# Patient Record
Sex: Male | Born: 1960
Health system: Southern US, Community
[De-identification: ages and names within clinical notes are randomized; demographics above are authoritative.]

## PROBLEM LIST (undated history)

## (undated) DIAGNOSIS — I10 Essential (primary) hypertension: Secondary | ICD-10-CM

## (undated) DIAGNOSIS — I2510 Atherosclerotic heart disease of native coronary artery without angina pectoris: Secondary | ICD-10-CM

## (undated) DIAGNOSIS — M549 Dorsalgia, unspecified: Secondary | ICD-10-CM

## (undated) DIAGNOSIS — C801 Malignant (primary) neoplasm, unspecified: Secondary | ICD-10-CM

## (undated) DIAGNOSIS — IMO0002 Reserved for concepts with insufficient information to code with codable children: Secondary | ICD-10-CM

## (undated) DIAGNOSIS — I77810 Thoracic aortic ectasia: Secondary | ICD-10-CM

## (undated) DIAGNOSIS — H35039 Hypertensive retinopathy, unspecified eye: Secondary | ICD-10-CM

## (undated) DIAGNOSIS — E785 Hyperlipidemia, unspecified: Secondary | ICD-10-CM

## (undated) DIAGNOSIS — I5189 Other ill-defined heart diseases: Secondary | ICD-10-CM

## (undated) DIAGNOSIS — G5600 Carpal tunnel syndrome, unspecified upper limb: Secondary | ICD-10-CM

## (undated) HISTORY — PX: TONSILLECTOMY: SUR1361

## (undated) HISTORY — DX: Hypertensive retinopathy, unspecified eye: H35.039

## (undated) HISTORY — DX: Reserved for concepts with insufficient information to code with codable children: IMO0002

## (undated) HISTORY — PX: COLONOSCOPY: SHX174

## (undated) HISTORY — PX: SHOULDER ARTHROSCOPY: SHX128

## (undated) HISTORY — PX: KNEE ARTHROSCOPY: SHX127

## (undated) HISTORY — DX: Malignant (primary) neoplasm, unspecified: C80.1

---

## 2001-10-14 HISTORY — PX: RIB RESECTION: SHX5077

## 2008-10-14 DIAGNOSIS — C801 Malignant (primary) neoplasm, unspecified: Secondary | ICD-10-CM

## 2008-10-14 HISTORY — DX: Malignant (primary) neoplasm, unspecified: C80.1

## 2012-12-24 ENCOUNTER — Other Ambulatory Visit: Payer: Self-pay | Admitting: Orthopedic Surgery

## 2013-01-01 ENCOUNTER — Encounter (HOSPITAL_BASED_OUTPATIENT_CLINIC_OR_DEPARTMENT_OTHER): Payer: Self-pay | Admitting: *Deleted

## 2013-01-06 ENCOUNTER — Encounter (HOSPITAL_BASED_OUTPATIENT_CLINIC_OR_DEPARTMENT_OTHER): Payer: Self-pay | Admitting: Anesthesiology

## 2013-01-06 ENCOUNTER — Encounter (HOSPITAL_BASED_OUTPATIENT_CLINIC_OR_DEPARTMENT_OTHER): Payer: Self-pay | Admitting: *Deleted

## 2013-01-06 ENCOUNTER — Encounter (HOSPITAL_BASED_OUTPATIENT_CLINIC_OR_DEPARTMENT_OTHER)
Admission: RE | Disposition: A | Discharge status: Home / Self Care | Payer: Self-pay | Source: Ambulatory Visit | Attending: Orthopedic Surgery

## 2013-01-06 ENCOUNTER — Ambulatory Visit (HOSPITAL_BASED_OUTPATIENT_CLINIC_OR_DEPARTMENT_OTHER)
Admission: RE | Admit: 2013-01-06 | Discharge: 2013-01-06 | Disposition: A | Discharge status: Home / Self Care | Payer: Managed Care, Other (non HMO) | Source: Ambulatory Visit | Attending: Orthopedic Surgery | Admitting: Orthopedic Surgery

## 2013-01-06 ENCOUNTER — Ambulatory Visit (HOSPITAL_BASED_OUTPATIENT_CLINIC_OR_DEPARTMENT_OTHER): Payer: Managed Care, Other (non HMO) | Admitting: Anesthesiology

## 2013-01-06 DIAGNOSIS — M549 Dorsalgia, unspecified: Secondary | ICD-10-CM | POA: Insufficient documentation

## 2013-01-06 DIAGNOSIS — Z79899 Other long term (current) drug therapy: Secondary | ICD-10-CM | POA: Insufficient documentation

## 2013-01-06 DIAGNOSIS — Z791 Long term (current) use of non-steroidal anti-inflammatories (NSAID): Secondary | ICD-10-CM | POA: Insufficient documentation

## 2013-01-06 DIAGNOSIS — G5600 Carpal tunnel syndrome, unspecified upper limb: Secondary | ICD-10-CM | POA: Insufficient documentation

## 2013-01-06 DIAGNOSIS — E785 Hyperlipidemia, unspecified: Secondary | ICD-10-CM | POA: Insufficient documentation

## 2013-01-06 HISTORY — DX: Dorsalgia, unspecified: M54.9

## 2013-01-06 HISTORY — DX: Carpal tunnel syndrome, unspecified upper limb: G56.00

## 2013-01-06 HISTORY — PX: CARPAL TUNNEL RELEASE: SHX101

## 2013-01-06 HISTORY — DX: Hyperlipidemia, unspecified: E78.5

## 2013-01-06 SURGERY — CARPAL TUNNEL RELEASE
Anesthesia: Monitor Anesthesia Care | Site: Hand | Laterality: Left | Wound class: Clean

## 2013-01-06 MED ORDER — OXYCODONE HCL 5 MG/5ML PO SOLN
5.0000 mg | Freq: Once | ORAL | Status: DC | PRN
Start: 2013-01-06 — End: 2013-01-06

## 2013-01-06 MED ORDER — OXYCODONE HCL 5 MG PO TABS: 5.0000 mg | ORAL_TABLET | Freq: Once | ORAL | Status: DC | PRN

## 2013-01-06 MED ORDER — ONDANSETRON HCL 4 MG/2ML IJ SOLN
INTRAMUSCULAR | Status: DC | PRN
  Administered 2013-01-06: 4 mg via INTRAVENOUS

## 2013-01-06 MED ORDER — CHLORHEXIDINE GLUCONATE 4 % EX LIQD: 60.0000 mL | Freq: Once | CUTANEOUS | Status: DC

## 2013-01-06 MED ORDER — BUPIVACAINE HCL (PF) 0.25 % IJ SOLN
INTRAMUSCULAR | Status: DC | PRN
  Administered 2013-01-06: 9 mL

## 2013-01-06 MED ORDER — CEFAZOLIN SODIUM-DEXTROSE 2-3 GM-% IV SOLR: 2.0000 g | INTRAVENOUS | Status: DC

## 2013-01-06 MED ORDER — PROPOFOL INFUSION 10 MG/ML OPTIME
INTRAVENOUS | Status: DC | PRN
  Administered 2013-01-06: 100 ug/kg/min via INTRAVENOUS

## 2013-01-06 MED ORDER — HYDROCODONE-ACETAMINOPHEN 5-325 MG PO TABS: 1.0000 | ORAL_TABLET | Freq: Four times a day (QID) | ORAL | Status: DC | PRN

## 2013-01-06 MED ORDER — FENTANYL CITRATE 0.05 MG/ML IJ SOLN: 50.0000 ug | INTRAMUSCULAR | Status: DC | PRN

## 2013-01-06 MED ORDER — METOCLOPRAMIDE HCL 5 MG/ML IJ SOLN: 10.0000 mg | Freq: Once | INTRAMUSCULAR | Status: DC | PRN

## 2013-01-06 MED ORDER — MIDAZOLAM HCL 5 MG/5ML IJ SOLN
INTRAMUSCULAR | Status: DC | PRN
  Administered 2013-01-06: 2 mg via INTRAVENOUS

## 2013-01-06 MED ORDER — FENTANYL CITRATE 0.05 MG/ML IJ SOLN
INTRAMUSCULAR | Status: DC | PRN
  Administered 2013-01-06 (×2): 50 ug via INTRAVENOUS

## 2013-01-06 MED ORDER — MIDAZOLAM HCL 2 MG/2ML IJ SOLN: 1.0000 mg | INTRAMUSCULAR | Status: DC | PRN

## 2013-01-06 MED ORDER — CEFAZOLIN SODIUM-DEXTROSE 2-3 GM-% IV SOLR
2.0000 g | INTRAVENOUS | Status: AC
  Administered 2013-01-06: 2 g via INTRAVENOUS

## 2013-01-06 MED ORDER — FENTANYL CITRATE 0.05 MG/ML IJ SOLN: 25.0000 ug | INTRAMUSCULAR | Status: DC | PRN

## 2013-01-06 MED ORDER — LACTATED RINGERS IV SOLN
INTRAVENOUS | Status: DC
  Administered 2013-01-06 (×2): via INTRAVENOUS

## 2013-01-06 MED ORDER — LIDOCAINE HCL (CARDIAC) 20 MG/ML IV SOLN
INTRAVENOUS | Status: DC | PRN
  Administered 2013-01-06: 50 mg via INTRAVENOUS

## 2013-01-06 SURGICAL SUPPLY — 36 items
BANDAGE GAUZE ELAST BULKY 4 IN (GAUZE/BANDAGES/DRESSINGS) ×2 IMPLANT
BLADE SURG 15 STRL LF DISP TIS (BLADE) ×1 IMPLANT
BLADE SURG 15 STRL SS (BLADE) ×1
BNDG COHESIVE 3X5 TAN STRL LF (GAUZE/BANDAGES/DRESSINGS) ×2 IMPLANT
BNDG ESMARK 4X9 LF (GAUZE/BANDAGES/DRESSINGS) IMPLANT
CHLORAPREP W/TINT 26ML (MISCELLANEOUS) ×2 IMPLANT
CLOTH BEACON ORANGE TIMEOUT ST (SAFETY) ×2 IMPLANT
CORDS BIPOLAR (ELECTRODE) ×2 IMPLANT
COVER MAYO STAND STRL (DRAPES) ×2 IMPLANT
COVER TABLE BACK 60X90 (DRAPES) ×2 IMPLANT
CUFF TOURNIQUET SINGLE 18IN (TOURNIQUET CUFF) ×2 IMPLANT
DRAPE EXTREMITY T 121X128X90 (DRAPE) ×2 IMPLANT
DRAPE SURG 17X23 STRL (DRAPES) ×2 IMPLANT
DRSG KUZMA FLUFF (GAUZE/BANDAGES/DRESSINGS) ×2 IMPLANT
GAUZE XEROFORM 1X8 LF (GAUZE/BANDAGES/DRESSINGS) ×2 IMPLANT
GLOVE BIO SURGEON STRL SZ 6.5 (GLOVE) ×2 IMPLANT
GLOVE BIOGEL PI IND STRL 8.5 (GLOVE) ×1 IMPLANT
GLOVE BIOGEL PI INDICATOR 8.5 (GLOVE) ×1
GLOVE SURG ORTHO 8.0 STRL STRW (GLOVE) ×2 IMPLANT
GOWN BRE IMP PREV XXLGXLNG (GOWN DISPOSABLE) ×2 IMPLANT
GOWN PREVENTION PLUS XLARGE (GOWN DISPOSABLE) ×2 IMPLANT
NEEDLE 27GAX1X1/2 (NEEDLE) ×2 IMPLANT
NS IRRIG 1000ML POUR BTL (IV SOLUTION) ×2 IMPLANT
PACK BASIN DAY SURGERY FS (CUSTOM PROCEDURE TRAY) ×2 IMPLANT
PAD CAST 3X4 CTTN HI CHSV (CAST SUPPLIES) ×1 IMPLANT
PADDING CAST ABS 4INX4YD NS (CAST SUPPLIES) ×1
PADDING CAST ABS COTTON 4X4 ST (CAST SUPPLIES) ×1 IMPLANT
PADDING CAST COTTON 3X4 STRL (CAST SUPPLIES) ×1
SPONGE GAUZE 4X4 12PLY (GAUZE/BANDAGES/DRESSINGS) ×2 IMPLANT
STOCKINETTE 4X48 STRL (DRAPES) ×2 IMPLANT
SUT VICRYL 4-0 PS2 18IN ABS (SUTURE) IMPLANT
SUT VICRYL RAPIDE 4/0 PS 2 (SUTURE) ×2 IMPLANT
SYR BULB 3OZ (MISCELLANEOUS) ×2 IMPLANT
SYR CONTROL 10ML LL (SYRINGE) ×2 IMPLANT
TOWEL OR 17X24 6PK STRL BLUE (TOWEL DISPOSABLE) ×2 IMPLANT
UNDERPAD 30X30 INCONTINENT (UNDERPADS AND DIAPERS) ×2 IMPLANT

## 2013-01-06 NOTE — Anesthesia Procedure Notes (Signed)
Anesthesia Regional Block:  Bier block (IV Regional)  Pre-Anesthetic Checklist: ,, timeout performed, Correct Patient, Correct Site, Correct Laterality, Correct Procedure,, site marked, surgical consent,, at surgeon's request Needles:  Injection technique: Single-shot  Needle Type: Other      Needle Gauge: 20 and 20 G    Additional Needles: Bier block (IV Regional) Narrative:  Start time: 01/06/2013 8:38 AM Injection made incrementally with aspirations every 35 mL.  Performed by: Personally   Bier block (IV Regional)

## 2013-01-06 NOTE — Brief Op Note (Signed)
01/06/2013  9:01 AM  PATIENT:  Penne Lash  52 y.o. male  PRE-OPERATIVE DIAGNOSIS:  LEFT CARPAL TUNNEL SYNDROME, 354.0  POST-OPERATIVE DIAGNOSIS:  LEFT CARPAL TUNNEL SYNDROME, 354.0  PROCEDURE:  Procedure(s) with comments: CARPAL TUNNEL RELEASE (Left) - ANESTHESIA: IV REGIONAL FAB  SURGEON:  Surgeon(s) and Role:    * Nicki Reaper, MD - Primary  PHYSICIAN ASSISTANT:   ASSISTANTS: none   ANESTHESIA:   local and regional  EBL:  Total I/O In: 1000 [I.V.:1000] Out: -   BLOOD ADMINISTERED:none  DRAINS: none   LOCAL MEDICATIONS USED:  MARCAINE     SPECIMEN:  No Specimen  DISPOSITION OF SPECIMEN:  N/A  COUNTS:  YES  TOURNIQUET:   Total Tourniquet Time Documented: Forearm (Left) - 21 minutes Total: Forearm (Left) - 21 minutes   DICTATION: .Other Dictation: Dictation Number [id number]  PLAN OF CARE: Discharge to home after PACU  PATIENT DISPOSITION:  PACU - hemodynamically stable.

## 2013-01-06 NOTE — Op Note (Signed)
Dictation Number [id number]

## 2013-01-06 NOTE — Transfer of Care (Signed)
Immediate Anesthesia Transfer of Care Note  Patient: Andrew Mitchell  Procedure(s) Performed: Procedure(s) with comments: CARPAL TUNNEL RELEASE (Left) - ANESTHESIA: IV REGIONAL FAB  Patient Location: PACU  Anesthesia Type:Bier block  Level of Consciousness: awake and alert   Airway & Oxygen Therapy: Patient Spontanous Breathing and Patient connected to face mask oxygen  Post-op Assessment: Report given to PACU RN and Post -op Vital signs reviewed and stable  Post vital signs: Reviewed and stable  Complications: No apparent anesthesia complications

## 2013-01-06 NOTE — Op Note (Signed)
NAMEMYREON, WIMER.:  [id number]  MEDICAL RECORD NO.:  [id number]  LOCATION:                                 FACILITY:  PHYSICIAN:  Cindee Salt, M.D.            DATE OF BIRTH:  DATE OF PROCEDURE:  01/06/2013 DATE OF DISCHARGE:                              OPERATIVE REPORT   PREOPERATIVE DIAGNOSIS:  Carpal tunnel syndrome, left hand.  POSTOPERATIVE DIAGNOSIS:  Carpal tunnel syndrome, left hand.  OPERATION:  Decompression left median nerve.  SURGEON:  Cindee Salt, MD  ANESTHESIA:  Forearm-based IV regional local infiltration.  ANESTHESIOLOGIST:  Janetta Hora. Gelene Mink, M.D.  HISTORY:  The patient is a 52 year old male with a history of carpal tunnel syndrome, nerve conduction is positive.  This has been unresponsive to conservative treatment.  He has elected to undergo surgical release.  Pre, peri, and postoperative course has been discussed along with risks and complications.  He is aware that there is no guarantee with surgery; possibility of infection; recurrence of injury to arteries, nerves, tendons, incomplete relief of symptoms, dystrophy.  In preoperative area, the patient is seen, the extremity marked by both the patient and surgeon.  Antibiotic given.  PROCEDURE:  The patient was brought to the operating room where a forearm-based IV regional anesthetic was carried out without difficulty. He was prepped using ChloraPrep, supine position, left arm free.  A 3- minute dry time was allowed.  Time-out taken, confirming patient and procedure.  A longitudinal incision was made in the palm.  He had some feeling.  This was then injected with 0.25% Marcaine without epinephrine, 5 mL was used.  The palmar fascia was split.  Superficial palmar arch identified.  The flexor tendon to the ring little finger identified to the ulnar side of median nerve.  The carpal retinaculum was incised with sharp dissection.  Right angle and Sewall retractor were placed  between skin and forearm fascia.  The fascia was released for approximately 1.5 cm proximal to the wrist crease under direct vision.  Canal was explored.  Compression to the nerve was apparent.  No branch entered into muscle.  No further lesions were identified.  The wound was copiously irrigated with saline and closed with 4-0 Vicryl Rapide sutures.  A further infiltration with 0.25% Marcaine without epinephrine was given, total of 9 mL was used.  Sterile compressive dressing with fingers free was applied.  On deflation of the tourniquet, all fingers immediately pinked.  He was taken to the recovery room for observation in satisfactory condition.  He will be discharged home to return to the [facility] [place] in 1 week on [place].          ______________________________ Cindee Salt, M.D.     GK/MEDQ  D:  01/06/2013  T:  01/06/2013  Job:  [id number]

## 2013-01-06 NOTE — H&P (Signed)
Andrew Mitchell is a 52 year-old right-hand dominant male with numbness and tingling in his thumbs through middle finger left hand.  This has been going on for the past several months, increasing over the past two weeks.  He recalls no history of injury.  It occasionally awakens him at night.  He has no history of injury to the hand or neck. He states that driving bothers it.  He has been trying Aleve which has given him minimal relief.  He has no history of diabetes, thyroid problems, arthritis or gout.  He states that driving increases his symptoms. He complains of constant, moderate, throbbing, aching pain with a feeling of numbness and weakness.  He states that it is getting worse. He does have a brace to wear. NCV's are positive for cts.  ALLERGIES:   Shrimp.  MEDICATIONS:    Diclofenac, Lipitor, baby aspirin, multivitamins.   He is advised not to take two nonsteroidal anti-inflammatories at the same time.   SURGICAL HISTORY:    He has had shoulder surgery, rib resection, knee surgery and deviated septum repair.  FAMILY MEDICAL HISTORY:     Positive for heart disease, otherwise negative.  SOCIAL HISTORY:     He does not smoke, drinks socially.  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  REVIEW OF SYSTEMS:    Positive for glasses, otherwise negative 14 points.  Andrew Mitchell is an 52 y.o. male.   Chief Complaint: cts left HPI: see above  Past Medical History  Diagnosis Date  . Hyperlipemia   . Carpal tunnel syndrome   . Back pain     Past Surgical History  Procedure Laterality Date  . Tonsillectomy    . Colonoscopy    . Shoulder arthroscopy      rightx2  . Knee arthroscopy      rightx2  . Rib resection  2003    thorasic outlet syndrome-rt    History reviewed. No pertinent family history. Social History:  reports that he has never smoked. He does not have any smokeless tobacco history on file. He reports that  drinks alcohol. He reports that he does not use illicit  drugs.  Allergies: No Known Allergies  Medications Prior to Admission  Medication Sig Dispense Refill  . atorvastatin (LIPITOR) 20 MG tablet Take 20 mg by mouth daily.      . diclofenac (VOLTAREN) 75 MG EC tablet Take 75 mg by mouth as needed.      . Multiple Vitamins-Minerals (MULTIVITAMIN WITH MINERALS) tablet Take 1 tablet by mouth daily.        No results found for this or any previous visit (from the past 48 hour(s)).  No results found.   Pertinent items are noted in HPI.  Blood pressure 124/82, pulse 67, temperature 97.7 F (36.5 C), temperature source Oral, resp. rate 20, height 5\' 10"  (1.778 m), weight 88.361 kg (194 lb 12.8 oz), SpO2 97.00%.  General appearance: cooperative and appears stated age Head: Normocephalic, without obvious abnormality Neck: no JVD Resp: clear to auscultation bilaterally Cardio: regular rate and rhythm, S1, S2 normal, no murmur, click, rub or gallop GI: soft, non-tender; bowel sounds normal; no masses,  no organomegaly Extremities: extremities normal, atraumatic, no cyanosis or edema Pulses: 2+ and symmetric Skin: Skin color, texture, turgor normal. No rashes or lesions Neurologic: Grossly normal Incision/Wound: na  Assessment/Plan  He desires proceeding with left carpal tunnel release. The pre, peri and post op course are discussed along with  risks and complications.  He is aware there is no guarantee with surgery, possibility of infection, recurrence, injury to arteries, nerves and tendons, incomplete relief of symptoms and dystrophy.  This will be scheduled for left carpal tunnel release as an outpatient under regional anesthesia.  Andrew Mitchell R 01/06/2013, 8:24 AM

## 2013-01-06 NOTE — Anesthesia Postprocedure Evaluation (Signed)
Anesthesia Post Note  Patient: Andrew Mitchell  Procedure(s) Performed: Procedure(s) (LRB): CARPAL TUNNEL RELEASE (Left)  Anesthesia type: MAC  Patient location: PACU  Post pain: Pain level controlled  Post assessment: Patient's Cardiovascular Status Stable  Last Vitals:  Filed Vitals:   01/06/13 0915  BP:   Pulse: 55  Temp:   Resp: 15    Post vital signs: Reviewed and stable  Level of consciousness: alert  Complications: No apparent anesthesia complications

## 2013-01-06 NOTE — Anesthesia Preprocedure Evaluation (Signed)
Anesthesia Evaluation  Patient identified by MRN, date of birth, ID band Patient awake    Reviewed: Allergy & Precautions, H&P , NPO status , Patient's Chart, lab work & pertinent test results, reviewed documented beta blocker date and time   Airway Mallampati: II TM Distance: >3 FB Neck ROM: full    Dental   Pulmonary neg pulmonary ROS,  breath sounds clear to auscultation        Cardiovascular negative cardio ROS  Rhythm:regular     Neuro/Psych  Neuromuscular disease negative neurological ROS  negative psych ROS   GI/Hepatic negative GI ROS, Neg liver ROS,   Endo/Other  negative endocrine ROS  Renal/GU negative Renal ROS  negative genitourinary   Musculoskeletal   Abdominal   Peds  Hematology negative hematology ROS (+)   Anesthesia Other Findings See surgeon's H&P   Reproductive/Obstetrics negative OB ROS                           Anesthesia Physical Anesthesia Plan  ASA: II  Anesthesia Plan: MAC and Bier Block   Post-op Pain Management:    Induction:   Airway Management Planned: Simple Face Mask  Additional Equipment:   Intra-op Plan:   Post-operative Plan:   Informed Consent: I have reviewed the patients History and Physical, chart, labs and discussed the procedure including the risks, benefits and alternatives for the proposed anesthesia with the patient or authorized representative who has indicated his/her understanding and acceptance.   Dental Advisory Given  Plan Discussed with: CRNA and Surgeon  Anesthesia Plan Comments:         Anesthesia Quick Evaluation

## 2013-01-07 ENCOUNTER — Encounter (HOSPITAL_BASED_OUTPATIENT_CLINIC_OR_DEPARTMENT_OTHER): Payer: Self-pay | Admitting: Orthopedic Surgery

## 2017-02-21 ENCOUNTER — Encounter (INDEPENDENT_AMBULATORY_CARE_PROVIDER_SITE_OTHER): Payer: Self-pay

## 2017-02-21 ENCOUNTER — Ambulatory Visit (INDEPENDENT_AMBULATORY_CARE_PROVIDER_SITE_OTHER): Payer: BLUE CROSS/BLUE SHIELD | Admitting: Student

## 2017-02-21 ENCOUNTER — Encounter: Payer: Self-pay | Admitting: Student

## 2017-02-21 DIAGNOSIS — R269 Unspecified abnormalities of gait and mobility: Secondary | ICD-10-CM | POA: Insufficient documentation

## 2017-02-21 DIAGNOSIS — M84375D Stress fracture, left foot, subsequent encounter for fracture with routine healing: Secondary | ICD-10-CM | POA: Insufficient documentation

## 2017-02-21 NOTE — Assessment & Plan Note (Signed)
Continue boot as needed. Use orthotics to help with bone stress in the foot.

## 2017-02-21 NOTE — Progress Notes (Signed)
  Andrew Mitchell - 56 y.o. male MRN [id number]  Date of birth: 1960-12-13  SUBJECTIVE:  Including CC & ROS.  CC: left foot pain, orthotics eval  Presents with a one-month history of left lateral foot pain. States that he usually walks on the outside of his foot but has flat feet. He was seen at [facility] by Dr. French Ana who placed him in a boot because he believed he was having a stress reaction of the metatarsal. He has been doing well in the boot and has no pain. He tends to walk 4-5 miles per day. He presents for custom orthotics.   ROS: No unexpected weight loss, fever, chills, swelling, instability, muscle pain, numbness/tingling, redness, otherwise see HPI   PMHx - Updated and reviewed.  Contributory factors include: Negative PSHx - Updated and reviewed.  Contributory factors include:  Negative FHx - Updated and reviewed.  Contributory factors include:  Negative Social Hx - Updated and reviewed. Contributory factors include: walks 4-5 miles per day Medications - reviewed   DATA REVIEWED: Dr. Alvester Morin office notes which show fourth metatarsal stress reaction  PHYSICAL EXAM:  VS: BP:(!) 142/100  HR: bpm  TEMP: ( )  RESP:   HT:5\' 10"  (177.8 cm)   WT:180 lb (81.6 kg)  BMI:25.9 PHYSICAL EXAM: Gen: NAD, alert, cooperative with exam, well-appearing HEENT: clear conjunctiva,  CV:  no edema, capillary refill brisk, normal rate Resp: non-labored Skin: no rashes, normal turgor  Neuro: no gross deficits.  Psych:  alert and oriented  Ankle & Foot: No visible swelling, ecchymosis, erythema, ulcers, calluses, blister Arch: Normal w/o pes cavus or planus  Achilles tendon without nodules or tenderness No swelling of retrocalcaneal bursa + pain at 4th MT head on left No pain at base of 5th MT; No tenderness over cuboid; No tenderness over N spot or navicular prominence No tenderness on lateral and medial malleolus No sign of peroneal tendon subluxations or tenderness to  palpation Full in plantarflexion, dorsiflexion, inversion, and eversion of the foot; flexion and extension of the toes Strength: 5/5 in all directions. Sensation: intact Vascular: intact w/ dorsalis pedis & posterior tibialis pulses 2+ Stable lateral and medial ligaments; Negative Anterior drawer test Have splaying of the third and fourth toe bilaterally Bunionette formation left greater than right Bunion formation on left    ASSESSMENT & PLAN:   Abnormality of gait Custom orthotics made for patient today. Believe these will help with a stress reaction. Follow-up in 4 weeks to reassess and see if need to make adjustments. Recommended bringing dress shoes in to help with his foot problems  Stress reaction of foot with routine healing, left Continue boot as needed. Use orthotics to help with bone stress in the foot.  Patient was fitted for a standard, cushioned, semi-rigid orthotic. The orthotic was heated and afterward the patient stood on the orthotic blank positioned on the orthotic stand. The patient was positioned in subtalar neutral position and 10 degrees of ankle dorsiflexion in a weight bearing stance. After completion of molding, a stable base was applied to the orthotic blank. The blank was ground to a stable position for weight bearing. Size: 10 Base: Blue EVA Additional Posting and Padding: 4th and 5th ray post bilaterally The patient ambulated these, and they were very comfortable.  I spent 45 minutes with this patient, greater than 50% was face-to-face time counseling regarding the below diagnosis.

## 2017-02-21 NOTE — Assessment & Plan Note (Signed)
Custom orthotics made for patient today. Believe these will help with a stress reaction. Follow-up in 4 weeks to reassess and see if need to make adjustments. Recommended bringing dress shoes in to help with his foot problems

## 2017-03-19 ENCOUNTER — Ambulatory Visit: Payer: BLUE CROSS/BLUE SHIELD | Admitting: Student

## 2017-05-16 DIAGNOSIS — J329 Chronic sinusitis, unspecified: Secondary | ICD-10-CM

## 2017-05-16 DIAGNOSIS — G2581 Restless legs syndrome: Secondary | ICD-10-CM

## 2017-05-16 DIAGNOSIS — E785 Hyperlipidemia, unspecified: Secondary | ICD-10-CM

## 2017-05-16 DIAGNOSIS — F900 Attention-deficit hyperactivity disorder, predominantly inattentive type: Secondary | ICD-10-CM | POA: Insufficient documentation

## 2017-05-19 ENCOUNTER — Encounter: Payer: Self-pay | Admitting: Interventional Cardiology

## 2017-06-01 DIAGNOSIS — R0609 Other forms of dyspnea: Secondary | ICD-10-CM | POA: Insufficient documentation

## 2017-06-01 DIAGNOSIS — I25110 Atherosclerotic heart disease of native coronary artery with unstable angina pectoris: Secondary | ICD-10-CM | POA: Insufficient documentation

## 2017-06-01 NOTE — Progress Notes (Signed)
Cardiology Office Note    Date:  06/03/2017   ID:  Mitchell, Andrew 1961/01/24, MRN [id number]  PCP:  Corine Shelter, PA-C  Cardiologist: Sinclair Grooms, MD   Chief Complaint  Patient presents with  . Shortness of Breath    History of Present Illness:  Andrew Mitchell is a 56 y.o. male referred by Corine Shelter, PA-C for evaluation of shortness of breath.  He has a family history of coronary disease with positive suffering 3 myocardial infarctions for the age of 61. His father was a smoker. He has 2 older sisters wonderful, age 57, recently underwent stent implantation. Another sister has stable angina pectoris. No chest discomfort on exertion or history of coronary disease with [name]. Greater than 10 years ago we performed an exercise treadmill test that did not reveal any evidence of ischemia. At one point he was on statin therapy but it was discontinued after he fell there was musculoskeletal syndrome. Current lipid levels are unknown but he believes the total is greater than 200 and LDL greater than 1:30. He was sent [place] copies of the most recent results.  Over the last year or 2 he has noted exertional dyspnea, especially related to climbing stairs. He has no spontaneous dyspnea. He denies orthopnea and PND. There is no associated chest discomfort.  Past Medical History:  Diagnosis Date  . Back pain   . Carpal tunnel syndrome   . DDD (degenerative disc disease)   . Hyperlipemia   . Tubular adenocarcinoma ([place]) 2010   colon    Past Surgical History:  Procedure Laterality Date  . CARPAL TUNNEL RELEASE Left 01/06/2013   Procedure: CARPAL TUNNEL RELEASE;  Surgeon: Wynonia Sours, MD;  Location: [facility];  Service: Orthopedics;  Laterality: Left;  ANESTHESIA: IV REGIONAL FAB  . COLONOSCOPY    . KNEE ARTHROSCOPY     rightx2  . RIB RESECTION  2003   thorasic outlet syndrome-rt  . SHOULDER ARTHROSCOPY     rightx2  . TONSILLECTOMY      Current  Medications: Outpatient Medications Prior to Visit  Medication Sig Dispense Refill  . aspirin 81 MG tablet Take 81 mg by mouth daily.    . carbidopa-levodopa (SINEMET IR) 25-100 MG per tablet Take 1/2 to 1 tablet by mouth daily before bedtime.    . diclofenac (VOLTAREN) 75 MG EC tablet Take 75 mg by mouth daily as needed for mild pain or moderate pain.     Marland Kitchen ibuprofen (ADVIL,MOTRIN) 200 MG tablet Take 200 mg by mouth every 4 (four) hours as needed for fever or moderate pain.     Marland Kitchen lisdexamfetamine (VYVANSE) 60 MG capsule Take 60 mg by mouth every morning.    . Multiple Vitamins-Minerals (CENTRUM PO) Take 1 tablet by mouth daily.    . sildenafil (REVATIO) 20 MG tablet Take 2 to 5 tablets (40 to 100 mg) by mouth daily as needed for erectile dysfunction.    Marland Kitchen HYDROcodone-acetaminophen (NORCO) 5-325 MG per tablet Take 1 tablet by mouth every 6 (six) hours as needed for pain. (Patient not taking: Reported on 06/03/2017) 30 tablet 0  . lidocaine (XYLOCAINE) 2 % solution     . rOPINIRole (REQUIP) 0.5 MG tablet Take one tablet (0.5 mg) by mouth one (1) hour before bedtime.    . Methylcobalamin 1000 MCG LOZG Take 1,000 mcg by mouth daily.     No facility-administered medications prior to visit.      Allergies:  Shellfish allergy   Social History   Social History  . Marital status: Married    Spouse name: N/A  . Number of children: N/A  . Years of education: N/A   Social History Main Topics  . Smoking status: Never Smoker  . Smokeless tobacco: Never Used  . Alcohol use Yes     Comment: occ  . Drug use: No  . Sexual activity: Not Asked   Other Topics Concern  . None   Social History Narrative  . None     Family History:  The patient's family history is not on file.   ROS:   Please see the history of present illness.      All other systems reviewed and are negative.   PHYSICAL EXAM:   VS:  BP 136/84   Pulse 65   Ht 5\' 10"  (1.778 m)   Wt 188 lb (85.3 kg)   BMI 26.98 kg/m     GEN: Well nourished, well developed, in no acute distress  HEENT: normal  Neck: no JVD, carotid bruits, or masses Cardiac: RRR; no murmurs, rubs, or gallops,no edema  Respiratory:  clear to auscultation bilaterally, normal work of breathing GI: soft, nontender, nondistended, + BS MS: no deformity or atrophy  Skin: warm and dry, no rash Neuro:  Alert and Oriented x 3, Strength and sensation are intact Psych: euthymic mood, full affect  Wt Readings from Last 3 Encounters:  06/03/17 188 lb (85.3 kg)  02/21/17 180 lb (81.6 kg)  01/06/13 194 lb 12.8 oz (88.4 kg)      Studies/Labs Reviewed:   EKG:  EKG  Normal sinus rhythm, normal interval, no evidence of infarction or chamber enlargement. Overall interpretation is that of normal EKG.  Recent Labs: No results found for requested labs within last 8760 hours.   Lipid Panel No results found for: CHOL, TRIG, HDL, CHOLHDL, VLDL, LDLCALC, LDLDIRECT  Additional studies/ records that were reviewed today include:  None    ASSESSMENT:    1. DOE (dyspnea on exertion)   2. Family history of early CAD   3. Hyperlipidemia, unspecified hyperlipidemia type      PLAN:  In order of problems listed above:  1. Uncertain etiology but coronary disease needs to be excluded. We need to exclude subclinical left ventricular diastolic and or systolic dysfunction. We need to exclude coronary artery disease. A 2-D Doppler echocardiogram has been ordered. Excess treadmill testing will also be done. 2. Sisters and father have had obstructive coronary disease therapy before the age of 65. 3. Status is unknown. Awaiting results from the patient.   Aggressive risk factor modification will be our action plan. We should have an LDL lowering less than 100. He may need to resume statin therapy. Assuming no abnormalities are noted on stress testing and echo, we will encourage aerobic activity on a more continuous basis to build endurance.  Medication  Adjustments/Labs and Tests Ordered: Current medicines are reviewed at length with the patient today.  Concerns regarding medicines are outlined above.  Medication changes, Labs and Tests ordered today are listed in the Patient Instructions below. There are no Patient Instructions on file for this visit.   Signed, Sinclair Grooms, MD  06/03/2017 8:51 AM    [facility] [address], [place], [place]  [address] Phone: [phone]; Fax: [phone]

## 2017-06-03 ENCOUNTER — Ambulatory Visit (INDEPENDENT_AMBULATORY_CARE_PROVIDER_SITE_OTHER): Payer: BLUE CROSS/BLUE SHIELD | Admitting: Interventional Cardiology

## 2017-06-03 ENCOUNTER — Encounter: Payer: Self-pay | Admitting: Interventional Cardiology

## 2017-06-03 ENCOUNTER — Encounter (INDEPENDENT_AMBULATORY_CARE_PROVIDER_SITE_OTHER): Payer: Self-pay

## 2017-06-03 VITALS — BP 136/84 | HR 65 | Ht 70.0 in | Wt 188.0 lb

## 2017-06-03 DIAGNOSIS — R0609 Other forms of dyspnea: Secondary | ICD-10-CM

## 2017-06-03 DIAGNOSIS — E785 Hyperlipidemia, unspecified: Secondary | ICD-10-CM

## 2017-06-03 DIAGNOSIS — Z8249 Family history of ischemic heart disease and other diseases of the circulatory system: Secondary | ICD-10-CM

## 2017-06-03 NOTE — Addendum Note (Signed)
Addended by: Loren Racer on: 06/03/2017 09:02 AM   Modules accepted: Orders

## 2017-06-03 NOTE — Patient Instructions (Signed)
Medication Instructions:  None  Labwork: None  Testing/Procedures: Your physician has requested that you have an exercise tolerance test. For further information please visit [email or web address]. Please also follow instruction sheet, as given.  Your physician has requested that you have an echocardiogram. Echocardiography is a painless test that uses sound waves to create images of your heart. It provides your doctor with information about the size and shape of your heart and how well your heart's chambers and valves are working. This procedure takes approximately one hour. There are no restrictions for this procedure.   Follow-Up: Your physician recommends that you schedule a follow-up appointment as needed with Dr. Tamala Julian.    Any Other Special Instructions Will Be Listed Below (If Applicable).     If you need a refill on your cardiac medications before your next appointment, please call your pharmacy.

## 2017-06-18 ENCOUNTER — Other Ambulatory Visit: Payer: BLUE CROSS/BLUE SHIELD | Admitting: *Deleted

## 2017-06-18 ENCOUNTER — Ambulatory Visit (INDEPENDENT_AMBULATORY_CARE_PROVIDER_SITE_OTHER): Payer: BLUE CROSS/BLUE SHIELD | Admitting: Sports Medicine

## 2017-06-18 ENCOUNTER — Ambulatory Visit (INDEPENDENT_AMBULATORY_CARE_PROVIDER_SITE_OTHER): Payer: BLUE CROSS/BLUE SHIELD

## 2017-06-18 ENCOUNTER — Other Ambulatory Visit: Payer: Self-pay

## 2017-06-18 ENCOUNTER — Ambulatory Visit (HOSPITAL_COMMUNITY): Payer: BLUE CROSS/BLUE SHIELD | Attending: Cardiology

## 2017-06-18 VITALS — BP 138/88 | Ht 70.0 in | Wt 180.0 lb

## 2017-06-18 DIAGNOSIS — E785 Hyperlipidemia, unspecified: Secondary | ICD-10-CM

## 2017-06-18 DIAGNOSIS — Z8249 Family history of ischemic heart disease and other diseases of the circulatory system: Secondary | ICD-10-CM | POA: Diagnosis not present

## 2017-06-18 DIAGNOSIS — R0609 Other forms of dyspnea: Secondary | ICD-10-CM

## 2017-06-18 DIAGNOSIS — R269 Unspecified abnormalities of gait and mobility: Secondary | ICD-10-CM

## 2017-06-18 DIAGNOSIS — I340 Nonrheumatic mitral (valve) insufficiency: Secondary | ICD-10-CM | POA: Insufficient documentation

## 2017-06-18 LAB — EXERCISE TOLERANCE TEST
CHL CUP STRESS STAGE 1 DBP: 105 mmHg
CHL CUP STRESS STAGE 1 GRADE: 0 %
CHL CUP STRESS STAGE 1 SBP: 168 mmHg
CHL CUP STRESS STAGE 1 SPEED: 0 mph
CHL CUP STRESS STAGE 10 SBP: 177 mmHg
CHL CUP STRESS STAGE 2 HR: 75 {beats}/min
CHL CUP STRESS STAGE 3 HR: 75 {beats}/min
CHL CUP STRESS STAGE 4 SPEED: 1.7 mph
CHL CUP STRESS STAGE 5 DBP: 79 mmHg
CHL CUP STRESS STAGE 5 GRADE: 12 %
CHL CUP STRESS STAGE 5 HR: 118 {beats}/min
CHL CUP STRESS STAGE 5 SBP: 184 mmHg
CHL CUP STRESS STAGE 5 SPEED: 2.5 mph
CHL CUP STRESS STAGE 6 HR: 133 {beats}/min
CHL CUP STRESS STAGE 7 DBP: 97 mmHg
CHL CUP STRESS STAGE 7 GRADE: 16 %
CHL CUP STRESS STAGE 7 SPEED: 4.2 mph
CHL CUP STRESS STAGE 8 HR: 144 {beats}/min
CHL CUP STRESS STAGE 8 SPEED: 4.2 mph
CHL CUP STRESS STAGE 9 HR: 129 {beats}/min
CHL CUP STRESS STAGE 9 SBP: 214 mmHg
CSEPPMHR: 88 %
Estimated workload: 13.4 METS
Exercise duration (min): 12 min
Exercise duration (sec): 0 s
MPHR: 163 {beats}/min
Peak HR: 144 {beats}/min
Percent HR: 88 %
RPE: 16
Rest HR: 69 {beats}/min
Stage 1 HR: 73 {beats}/min
Stage 10 DBP: 111 mmHg
Stage 10 Grade: 0 %
Stage 10 HR: 90 {beats}/min
Stage 10 Speed: 0 mph
Stage 2 Grade: 0 %
Stage 2 Speed: 1 mph
Stage 3 Grade: 0.2 %
Stage 3 Speed: 1 mph
Stage 4 Grade: 10 %
Stage 4 HR: 100 {beats}/min
Stage 6 DBP: 86 mmHg
Stage 6 Grade: 14 %
Stage 6 SBP: 205 mmHg
Stage 6 Speed: 3.4 mph
Stage 7 HR: 144 {beats}/min
Stage 7 SBP: 209 mmHg
Stage 8 Grade: 16 %
Stage 9 DBP: 92 mmHg
Stage 9 Grade: 0 %
Stage 9 Speed: 0 mph

## 2017-06-18 LAB — LIPID PANEL
CHOLESTEROL TOTAL: 200 mg/dL — AB (ref 100–199)
Chol/HDL Ratio: 5 ratio (ref 0.0–5.0)
HDL: 40 mg/dL (ref >39)
LDL Calculated: 129 mg/dL — ABNORMAL HIGH (ref 0–99)
TRIGLYCERIDES: 157 mg/dL — AB (ref 0–149)
VLDL Cholesterol Cal: 31 mg/dL (ref 5–40)

## 2017-06-18 LAB — HEPATIC FUNCTION PANEL
ALBUMIN: 4.1 g/dL (ref 3.5–5.5)
ALK PHOS: 77 IU/L (ref 39–117)
ALT: 18 IU/L (ref 0–44)
AST: 20 IU/L (ref 0–40)
BILIRUBIN TOTAL: 0.4 mg/dL (ref 0.0–1.2)
Bilirubin, Direct: 0.1 mg/dL (ref 0.00–0.40)
Total Protein: 6.5 g/dL (ref 6.0–8.5)

## 2017-06-18 NOTE — Progress Notes (Signed)
  Patient comes in today for a second set of custom orthotics. He saw Dr. Drema Dallas in May of this year after being diagnosed with a metatarsal stress fracture in his left foot. His current orthotics have a fifth ray post and are very comfortable. He has no foot pain as long as he wears his orthotics.  A second pair of custom orthotics were created for him today. Gait was noted to be neutral and he noted them to be comfortable. Total time spent with the patient was 30 minutes with greater than 50% of the time spent in face-to-face consultation discussing orthotic construction, instruction, and fitting. Patient will follow-up as needed.  Patient was fitted for a : standard, cushioned, semi-rigid orthotic. The orthotic was heated and afterward the patient stood on the orthotic blank positioned on the orthotic stand. The patient was positioned in subtalar neutral position and 10 degrees of ankle dorsiflexion in a weight bearing stance. After completion of molding, a stable base was applied to the orthotic blank. The blank was ground to a stable position for weight bearing. Size: 10 Base: Blue EVA Posting: B/L fifth ray posts Additional orthotic padding: none

## 2017-06-19 ENCOUNTER — Telehealth: Payer: Self-pay | Admitting: *Deleted

## 2017-06-19 DIAGNOSIS — E785 Hyperlipidemia, unspecified: Secondary | ICD-10-CM

## 2017-06-19 MED ORDER — ROSUVASTATIN CALCIUM 5 MG PO TABS: 5.0000 mg | ORAL_TABLET | Freq: Every day | ORAL | 3 refills | Status: DC

## 2017-06-19 NOTE — Telephone Encounter (Signed)
-----   Message from Belva Crome, MD sent at 06/19/2017  3:49 PM EDT ----- Let the patient know#rosuvastatin 5 mg daily. Repeat liver and lipid panel in 2 months. A copy will be sent to Corine Shelter, PA-C

## 2017-06-19 NOTE — Telephone Encounter (Signed)
Made pt aware of lab results and recommendations per Dr. Tamala Julian.  Pt will come for labs on 11/7.  Advised to call if any issues on medication.  Pt verbalized understanding and was in agreement with this plan.

## 2017-07-30 ENCOUNTER — Telehealth: Payer: Self-pay | Admitting: Interventional Cardiology

## 2017-07-30 MED ORDER — EZETIMIBE 10 MG PO TABS: 10.0000 mg | ORAL_TABLET | Freq: Every day | ORAL | 3 refills | Status: DC

## 2017-07-30 NOTE — Telephone Encounter (Signed)
Called pt back.  Pressed 0 and the lady I spoke with transferred me to the wrong ext and the gentleman at this ext told me to call back.  Called pt back and left message to call.

## 2017-07-30 NOTE — Telephone Encounter (Signed)
Follow up     Pt returned your call, please press 0 when you call his work number and have him paged

## 2017-07-30 NOTE — Telephone Encounter (Signed)
Spoke with pt and went over information provided by Dr. Tamala Julian.  Pt would like to try Zetia and check labs in 2 months.  Prescription sent to preferred pharmacy. Labs scheduled for 12/19.  Pt appreciative for call.

## 2017-07-30 NOTE — Telephone Encounter (Signed)
It is okay to discontinue Crestor. Would recommend primary prevention and attempts to get LDL less than 100. Zetia can be tried but probably will not get his LDL to that level. Study is 10 mg per day. Lipid panel 2 months.

## 2017-07-30 NOTE — Telephone Encounter (Signed)
Pt states since shortly after starting Crestor, he developed lower back pain.  Pt states another physician mentioned Zetia to him.  Pt would like to know if Dr. Tamala Julian felt this medication would be adequate and if he would prescribe it?  Will send message to Dr. Tamala Julian for review and advisement.

## 2017-07-30 NOTE — Telephone Encounter (Signed)
New message    Patient states medication giving him back pain. Would like to have medication changed. Please call at work [phone]. Patient wants to consider Zetia.  Pt c/o medication issue:  1. Name of Medication: rosuvastatin (CRESTOR) 5 MG tablet 2. How are you currently taking this medication (dosage and times per day)? Take 1 tablet (5 mg total) by mouth daily.  3. Are you having a reaction (difficulty breathing--STAT)? NO  4. What is your medication issue? Back tightness

## 2017-07-30 NOTE — Telephone Encounter (Signed)
Left message to call back  

## 2017-08-20 ENCOUNTER — Other Ambulatory Visit: Payer: BLUE CROSS/BLUE SHIELD

## 2017-10-01 ENCOUNTER — Other Ambulatory Visit: Payer: BLUE CROSS/BLUE SHIELD

## 2017-10-01 DIAGNOSIS — E785 Hyperlipidemia, unspecified: Secondary | ICD-10-CM

## 2017-10-01 LAB — LIPID PANEL
CHOL/HDL RATIO: 4.9 ratio (ref 0.0–5.0)
Cholesterol, Total: 187 mg/dL (ref 100–199)
HDL: 38 mg/dL — ABNORMAL LOW (ref >39)
LDL Calculated: 112 mg/dL — ABNORMAL HIGH (ref 0–99)
TRIGLYCERIDES: 186 mg/dL — AB (ref 0–149)
VLDL Cholesterol Cal: 37 mg/dL (ref 5–40)

## 2017-10-01 LAB — HEPATIC FUNCTION PANEL
ALT: 21 IU/L (ref 0–44)
AST: 21 IU/L (ref 0–40)
Albumin: 4.2 g/dL (ref 3.5–5.5)
Alkaline Phosphatase: 76 IU/L (ref 39–117)
BILIRUBIN TOTAL: 0.4 mg/dL (ref 0.0–1.2)
BILIRUBIN, DIRECT: 0.09 mg/dL (ref 0.00–0.40)
Total Protein: 6.6 g/dL (ref 6.0–8.5)

## 2017-10-14 HISTORY — PX: EYE SURGERY: SHX253

## 2017-10-14 HISTORY — PX: CATARACT EXTRACTION: SUR2

## 2017-10-23 ENCOUNTER — Encounter: Payer: Self-pay | Admitting: Pharmacist

## 2017-10-23 ENCOUNTER — Ambulatory Visit (INDEPENDENT_AMBULATORY_CARE_PROVIDER_SITE_OTHER): Payer: BLUE CROSS/BLUE SHIELD | Admitting: Pharmacist

## 2017-10-23 DIAGNOSIS — E785 Hyperlipidemia, unspecified: Secondary | ICD-10-CM | POA: Diagnosis not present

## 2017-10-23 MED ORDER — ROSUVASTATIN CALCIUM 5 MG PO TABS: ORAL_TABLET | ORAL | 3 refills | Status: DC

## 2017-10-23 NOTE — Progress Notes (Signed)
Patient ID: Andrew Mitchell                 DOB: 20-Jan-1961                    MRN: [id number]     HPI: Andrew Mitchell is a 57 y.o. male patient of Dr. Tamala Julian that presents today for lipid evaluation.  PMH includes a strong family history of CAD with father suffering from 19 MI prior to age 84. He has previously been on Lipitor and rosuvastatin. He reports that he was on Lipitor for several years before someone suggested that his muscle pains could be related to this medication. He states he stopped the medication and in about a week felt 100% better. He states the same thing to a lesser extent happened when he started the rosuvastatin 5mg  daily and his primary care doctor suggested he try Zetia and stop crestor. He has done well on Zetia.  Risk Factors: strong family history LDL Goal: <100  Current Medications: Zetia 10mg  daily, krill oil 1000mg  daily  Intolerances: rosuvastatin 5mg  daily - lower back pain, atorvastatin 20mg  daily - lower back problems which improved as soon as he stopped the medication  Diet: Eats a lot of grilled chicken, he does eat a lot of bread and admits he should eat more vegetables. He rarely eats red meat. He does eat fish. He drinks diet pepsi and unsweet tea. He drinks a beer or two on weekends.   Exercise: [place]6 [place]   Family History: Father with 3 MIs prior to age 59. His father was a smoker. He has 2 older sisters one, age 77, recently underwent stent implantation. Another sister has stable angina pectoris.   Social History: Denies tobacco products.   Labs: 10/01/17: TC 187, TG 186, HDL 38, LDL 112 -zetia 10mg  daily  Past Medical History:  Diagnosis Date  . Back pain   . Carpal tunnel syndrome   . DDD (degenerative disc disease)   . Hyperlipemia   . 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 aspirin 81 MG tablet Take 81 mg by  mouth daily.    . carbidopa-levodopa (SINEMET IR) 25-100 MG per tablet Take 1/2 to 1 tablet by mouth daily before bedtime.    . diclofenac (VOLTAREN) 75 MG EC tablet Take 75 mg by mouth daily as needed for mild pain or moderate pain.     Marland Kitchen ezetimibe (ZETIA) 10 MG tablet Take 1 tablet (10 mg total) by mouth daily. 90 tablet 3  . HYDROcodone-acetaminophen (NORCO) 5-325 MG per tablet Take 1 tablet by mouth every 6 (six) hours as needed for pain. (Patient not taking: Reported on 06/03/2017) 30 tablet 0  . ibuprofen (ADVIL,MOTRIN) 200 MG tablet Take 200 mg by mouth every 4 (four) hours as needed for fever or moderate pain.     Marland Kitchen lidocaine (XYLOCAINE) 2 % solution     . lisdexamfetamine (VYVANSE) 60 MG capsule Take 60 mg by mouth every morning.    . Multiple Vitamins-Minerals (CENTRUM PO) Take 1 tablet by mouth daily.    Marland Kitchen rOPINIRole (REQUIP) 0.5 MG tablet Take one tablet (0.5 mg) by mouth one (1) hour before bedtime.    . sildenafil (REVATIO) 20 MG tablet Take 2 to 5 tablets (40 to 100 mg) by mouth daily as needed for erectile  dysfunction.    . vitamin B-12 (CYANOCOBALAMIN) 500 MCG tablet Take 1,500 mcg by mouth daily.     No current facility-administered medications on file prior to visit.     Allergies  Allergen Reactions  . Shellfish Allergy Anaphylaxis    Assessment/Plan: Hyperlipidemia: LDL not at goal <100 for primary prevention. He has a strong family history of cardiac disease and aggressive LDL lowering is warranted. He is willing to try rosuvastatin 5mg  twice weekly for lipid lowering. Will plan to follow up with him in 4-6 weeks for tolerability. If tolerating will plan to repeat cholesterol panel in 8-12 weeks.    Thank you,  Lelan Pons. Patterson Hammersmith, [facility]  10/23/2017 11:26 AM

## 2017-10-23 NOTE — Patient Instructions (Addendum)
START rosuvastatin 5mg  TWICE weekly  Call [phone] if you develop any muscle symptoms.   We will plan to repeat labs in 2-3 months.    Cholesterol Cholesterol is a fat. Your body needs a small amount of cholesterol. Cholesterol (plaque) may build up in your blood vessels (arteries). That makes you more likely to have a heart attack or stroke. You cannot feel your cholesterol level. Having a blood test is the only way to find out if your level is high. Keep your test results. Work with your doctor to keep your cholesterol at a good level. What do the results mean?  Total cholesterol is how much cholesterol is in your blood.  LDL is bad cholesterol. This is the type that can build up. Try to have low LDL.  HDL is good cholesterol. It cleans your blood vessels and carries LDL away. Try to have high HDL.  Triglycerides are fat that the body can store or burn for energy. What are good levels of cholesterol?  Total cholesterol below 200.  LDL below 100 is good for people who have health risks. LDL below 70 is good for people who have very high risks.  HDL above 40 is good. It is best to have HDL of 60 or higher.  Triglycerides below 150. How can I lower my cholesterol? Diet Follow your diet program as told by your doctor.  Choose fish, white meat chicken, or [place] that is roasted or baked. Try not to eat red meat, fried foods, sausage, or lunch meats.  Eat lots of fresh fruits and vegetables.  Choose whole grains, beans, pasta, potatoes, and cereals.  Choose olive oil, corn oil, or canola oil. Only use small amounts.  Try not to eat butter, mayonnaise, shortening, or palm kernel oils.  Try not to eat foods with trans fats.  Choose low-fat or nonfat dairy foods. ? Drink skim or nonfat milk. ? Eat low-fat or nonfat yogurt and cheeses. ? Try not to drink whole milk or cream. ? Try not to eat ice cream, egg yolks, or full-fat cheeses.  Healthy desserts include angel food  cake, ginger snaps, animal crackers, hard candy, popsicles, and low-fat or nonfat frozen yogurt. Try not to eat pastries, cakes, pies, and cookies.  Exercise Follow your exercise program as told by your doctor.  Be more active. Try gardening, walking, and taking the stairs.  Ask your doctor about ways that you can be more active.  Medicine  Take over-the-counter and prescription medicines only as told by your doctor. This information is not intended to replace advice given to you by your health care provider. Make sure you discuss any questions you have with your health care provider. Document Released: 12/27/2008 Document Revised: 05/01/2016 Document Reviewed: 04/11/2016 Elsevier Interactive Patient Education  [employer].

## 2017-11-17 ENCOUNTER — Telehealth: Payer: Self-pay | Admitting: Pharmacist

## 2017-11-17 DIAGNOSIS — E785 Hyperlipidemia, unspecified: Secondary | ICD-10-CM

## 2017-11-17 NOTE — Telephone Encounter (Signed)
Pt called to ask about CoQ 10 with his statin. Advised that it would be ok for him to try this. He has slowly increased his rosuvastatin to 3 times a week and plans to start taking every other day moving forward as he has been tolerating well. Have placed orders for a lipid panel in 2 months to evaluate cholesterol on every other day dosing.

## 2018-01-19 ENCOUNTER — Other Ambulatory Visit: Payer: BLUE CROSS/BLUE SHIELD

## 2018-02-06 ENCOUNTER — Other Ambulatory Visit: Payer: BLUE CROSS/BLUE SHIELD | Admitting: *Deleted

## 2018-02-06 ENCOUNTER — Encounter (INDEPENDENT_AMBULATORY_CARE_PROVIDER_SITE_OTHER): Payer: Self-pay

## 2018-02-06 DIAGNOSIS — E785 Hyperlipidemia, unspecified: Secondary | ICD-10-CM

## 2018-02-06 LAB — LIPID PANEL
CHOLESTEROL TOTAL: 122 mg/dL (ref 100–199)
Chol/HDL Ratio: 2.8 ratio (ref 0.0–5.0)
HDL: 44 mg/dL (ref >39)
LDL CALC: 62 mg/dL (ref 0–99)
TRIGLYCERIDES: 79 mg/dL (ref 0–149)
VLDL Cholesterol Cal: 16 mg/dL (ref 5–40)

## 2018-02-06 LAB — HEPATIC FUNCTION PANEL
ALK PHOS: 67 IU/L (ref 39–117)
ALT: 22 IU/L (ref 0–44)
AST: 18 IU/L (ref 0–40)
Albumin: 4.1 g/dL (ref 3.5–5.5)
BILIRUBIN TOTAL: 0.4 mg/dL (ref 0.0–1.2)
BILIRUBIN, DIRECT: 0.12 mg/dL (ref 0.00–0.40)
Total Protein: 6.5 g/dL (ref 6.0–8.5)

## 2018-07-14 ENCOUNTER — Other Ambulatory Visit: Payer: Self-pay

## 2018-07-14 MED ORDER — ROSUVASTATIN CALCIUM 5 MG PO TABS: ORAL_TABLET | ORAL | 0 refills | Status: DC

## 2018-07-29 ENCOUNTER — Other Ambulatory Visit: Payer: Self-pay | Admitting: Interventional Cardiology

## 2018-08-20 ENCOUNTER — Other Ambulatory Visit: Payer: Self-pay | Admitting: Interventional Cardiology

## 2018-08-20 MED ORDER — EZETIMIBE 10 MG PO TABS: 10.0000 mg | ORAL_TABLET | Freq: Every day | ORAL | 0 refills | Status: DC

## 2018-08-20 MED ORDER — ROSUVASTATIN CALCIUM 5 MG PO TABS: ORAL_TABLET | ORAL | 0 refills | Status: DC

## 2018-08-29 ENCOUNTER — Other Ambulatory Visit: Payer: Self-pay | Admitting: Interventional Cardiology

## 2018-09-04 ENCOUNTER — Telehealth: Payer: Self-pay

## 2018-09-04 MED ORDER — EZETIMIBE 10 MG PO TABS: 10.0000 mg | ORAL_TABLET | Freq: Every day | ORAL | 0 refills | Status: DC

## 2018-09-04 MED ORDER — ROSUVASTATIN CALCIUM 5 MG PO TABS: ORAL_TABLET | ORAL | 0 refills | Status: DC

## 2018-09-04 NOTE — Telephone Encounter (Signed)
Pt is requesting a refill for zetia 10 mg and crestor 5 mg. Pt needs 90 day script in order for his insurance to cover it. Would Dr. Tamala Julian be ok filling these meds? Pt has not been in the office since 06/03/17 and no upcoming appt. Thank you.

## 2018-09-04 NOTE — Telephone Encounter (Signed)
Ok to give one 90 day refill.  Further refills will need to come from PCP as we are only seeing pt back as needed.  Thanks!

## 2018-10-08 ENCOUNTER — Encounter (HOSPITAL_COMMUNITY): Payer: Self-pay

## 2018-10-08 ENCOUNTER — Inpatient Hospital Stay (HOSPITAL_COMMUNITY)
Admission: EM | Admit: 2018-10-08 | Discharge: 2018-10-10 | DRG: 247 | Disposition: A | Discharge status: Home / Self Care | Payer: 59 | Attending: Internal Medicine | Admitting: Internal Medicine

## 2018-10-08 ENCOUNTER — Other Ambulatory Visit: Payer: Self-pay

## 2018-10-08 ENCOUNTER — Inpatient Hospital Stay (HOSPITAL_COMMUNITY)
Admission: EM | Disposition: A | Discharge status: Home / Self Care | Payer: Self-pay | Source: Home / Self Care | Attending: Cardiovascular Disease

## 2018-10-08 ENCOUNTER — Emergency Department (HOSPITAL_COMMUNITY): Payer: 59

## 2018-10-08 DIAGNOSIS — I10 Essential (primary) hypertension: Secondary | ICD-10-CM | POA: Diagnosis present

## 2018-10-08 DIAGNOSIS — Z8249 Family history of ischemic heart disease and other diseases of the circulatory system: Secondary | ICD-10-CM

## 2018-10-08 DIAGNOSIS — I2582 Chronic total occlusion of coronary artery: Secondary | ICD-10-CM | POA: Diagnosis present

## 2018-10-08 DIAGNOSIS — I2510 Atherosclerotic heart disease of native coronary artery without angina pectoris: Secondary | ICD-10-CM

## 2018-10-08 DIAGNOSIS — Z91013 Allergy to seafood: Secondary | ICD-10-CM

## 2018-10-08 DIAGNOSIS — I493 Ventricular premature depolarization: Secondary | ICD-10-CM | POA: Diagnosis present

## 2018-10-08 DIAGNOSIS — Z7982 Long term (current) use of aspirin: Secondary | ICD-10-CM | POA: Diagnosis not present

## 2018-10-08 DIAGNOSIS — Z791 Long term (current) use of non-steroidal anti-inflammatories (NSAID): Secondary | ICD-10-CM | POA: Diagnosis not present

## 2018-10-08 DIAGNOSIS — I2121 ST elevation (STEMI) myocardial infarction involving left circumflex coronary artery: Principal | ICD-10-CM | POA: Diagnosis present

## 2018-10-08 DIAGNOSIS — F900 Attention-deficit hyperactivity disorder, predominantly inattentive type: Secondary | ICD-10-CM | POA: Diagnosis present

## 2018-10-08 DIAGNOSIS — Z79899 Other long term (current) drug therapy: Secondary | ICD-10-CM

## 2018-10-08 DIAGNOSIS — Z955 Presence of coronary angioplasty implant and graft: Secondary | ICD-10-CM

## 2018-10-08 DIAGNOSIS — G2581 Restless legs syndrome: Secondary | ICD-10-CM | POA: Diagnosis present

## 2018-10-08 DIAGNOSIS — I213 ST elevation (STEMI) myocardial infarction of unspecified site: Secondary | ICD-10-CM | POA: Diagnosis not present

## 2018-10-08 DIAGNOSIS — I25110 Atherosclerotic heart disease of native coronary artery with unstable angina pectoris: Secondary | ICD-10-CM | POA: Clinically undetermined

## 2018-10-08 DIAGNOSIS — E785 Hyperlipidemia, unspecified: Secondary | ICD-10-CM | POA: Diagnosis present

## 2018-10-08 DIAGNOSIS — E7849 Other hyperlipidemia: Secondary | ICD-10-CM | POA: Diagnosis not present

## 2018-10-08 DIAGNOSIS — I5030 Unspecified diastolic (congestive) heart failure: Secondary | ICD-10-CM | POA: Diagnosis not present

## 2018-10-08 HISTORY — DX: Atherosclerotic heart disease of native coronary artery without angina pectoris: I25.10

## 2018-10-08 HISTORY — DX: Other ill-defined heart diseases: I51.89

## 2018-10-08 HISTORY — PX: CORONARY STENT INTERVENTION: CATH118234

## 2018-10-08 HISTORY — PX: CORONARY ANGIOGRAPHY: CATH118303

## 2018-10-08 HISTORY — PX: CORONARY/GRAFT ACUTE MI REVASCULARIZATION: CATH118305

## 2018-10-08 HISTORY — DX: Thoracic aortic ectasia: I77.810

## 2018-10-08 LAB — CBC
HCT: 49 % (ref 39.0–52.0)
Hemoglobin: 16.8 g/dL (ref 13.0–17.0)
MCH: 32.1 pg (ref 26.0–34.0)
MCHC: 34.3 g/dL (ref 30.0–36.0)
MCV: 93.5 fL (ref 80.0–100.0)
Platelets: 262 10*3/uL (ref 150–400)
RBC: 5.24 MIL/uL (ref 4.22–5.81)
RDW: 12.6 % (ref 11.5–15.5)
WBC: 11.3 10*3/uL — ABNORMAL HIGH (ref 4.0–10.5)
nRBC: 0 % (ref 0.0–0.2)

## 2018-10-08 LAB — COMPREHENSIVE METABOLIC PANEL
ALT: 18 U/L (ref 0–44)
AST: 74 U/L — ABNORMAL HIGH (ref 15–41)
Albumin: 4.3 g/dL (ref 3.5–5.0)
Alkaline Phosphatase: 66 U/L (ref 38–126)
Anion gap: 8 (ref 5–15)
BUN: 18 mg/dL (ref 6–20)
CO2: 29 mmol/L (ref 22–32)
Calcium: 10 mg/dL (ref 8.9–10.3)
Chloride: 102 mmol/L (ref 98–111)
Creatinine, Ser: 1.36 mg/dL — ABNORMAL HIGH (ref 0.61–1.24)
GFR calc Af Amer: >60 mL/min (ref >60)
GFR calc non Af Amer: 57 mL/min — ABNORMAL LOW (ref >60)
Glucose, Bld: 114 mg/dL — ABNORMAL HIGH (ref 70–99)
Potassium: 4.2 mmol/L (ref 3.5–5.1)
Sodium: 139 mmol/L (ref 135–145)
Total Bilirubin: 1.2 mg/dL (ref 0.3–1.2)
Total Protein: 7.4 g/dL (ref 6.5–8.1)

## 2018-10-08 LAB — LIPID PANEL
Cholesterol: 155 mg/dL (ref 0–200)
HDL: 43 mg/dL (ref >40)
LDL Cholesterol: 76 mg/dL (ref 0–99)
Total CHOL/HDL Ratio: 3.6 RATIO
Triglycerides: 182 mg/dL — ABNORMAL HIGH (ref <150)
VLDL: 36 mg/dL (ref 0–40)

## 2018-10-08 LAB — PROTIME-INR
INR: 0.96
Prothrombin Time: 12.7 seconds (ref 11.4–15.2)

## 2018-10-08 LAB — I-STAT TROPONIN, ED: Troponin i, poc: 3.39 ng/mL (ref 0.00–0.08)

## 2018-10-08 LAB — TROPONIN I: Troponin I: 3.53 ng/mL (ref <0.03)

## 2018-10-08 LAB — APTT: aPTT: 29 seconds (ref 24–36)

## 2018-10-08 SURGERY — CORONARY/GRAFT ACUTE MI REVASCULARIZATION
Anesthesia: LOCAL

## 2018-10-08 MED ORDER — HEPARIN SODIUM (PORCINE) 1000 UNIT/ML IJ SOLN
INTRAMUSCULAR | Status: AC
  Filled 2018-10-08: qty 1

## 2018-10-08 MED ORDER — NITROGLYCERIN 0.4 MG SL SUBL
0.4000 mg | SUBLINGUAL_TABLET | SUBLINGUAL | Status: DC | PRN
  Administered 2018-10-08: 0.4 mg via SUBLINGUAL
  Filled 2018-10-08: qty 1

## 2018-10-08 MED ORDER — VERAPAMIL HCL 2.5 MG/ML IV SOLN
INTRAVENOUS | Status: DC | PRN
  Administered 2018-10-08 (×2): 10 mL via INTRA_ARTERIAL

## 2018-10-08 MED ORDER — HEPARIN (PORCINE) IN NACL 1000-0.9 UT/500ML-% IV SOLN
INTRAVENOUS | Status: AC
  Filled 2018-10-08: qty 1000

## 2018-10-08 MED ORDER — NITROGLYCERIN 1 MG/10 ML FOR IR/CATH LAB
INTRA_ARTERIAL | Status: AC
  Filled 2018-10-08: qty 10

## 2018-10-08 MED ORDER — LIDOCAINE HCL (PF) 1 % IJ SOLN
INTRAMUSCULAR | Status: AC
  Filled 2018-10-08: qty 30

## 2018-10-08 MED ORDER — HEPARIN SODIUM (PORCINE) 5000 UNIT/ML IJ SOLN
4000.0000 [IU] | Freq: Once | INTRAMUSCULAR | Status: AC
  Administered 2018-10-08: 4000 [IU] via INTRAVENOUS
  Filled 2018-10-08: qty 1

## 2018-10-08 MED ORDER — HEPARIN (PORCINE) IN NACL 1000-0.9 UT/500ML-% IV SOLN
INTRAVENOUS | Status: DC | PRN
  Administered 2018-10-08 (×2): 500 mL

## 2018-10-08 MED ORDER — LIDOCAINE HCL (PF) 1 % IJ SOLN
INTRAMUSCULAR | Status: DC | PRN
  Administered 2018-10-08: 2 mL

## 2018-10-08 MED ORDER — TIROFIBAN (AGGRASTAT) BOLUS VIA INFUSION
INTRAVENOUS | Status: DC | PRN
  Administered 2018-10-08: 2065 ug via INTRAVENOUS

## 2018-10-08 MED ORDER — TIROFIBAN HCL IN NACL 5-0.9 MG/100ML-% IV SOLN
INTRAVENOUS | Status: DC | PRN
  Administered 2018-10-08: 0.15 ug/kg/min via INTRAVENOUS

## 2018-10-08 MED ORDER — MIDAZOLAM HCL 2 MG/2ML IJ SOLN
INTRAMUSCULAR | Status: AC
  Filled 2018-10-08: qty 2

## 2018-10-08 MED ORDER — HEPARIN SODIUM (PORCINE) 5000 UNIT/ML IJ SOLN: 60.0000 [IU]/kg | Freq: Once | INTRAMUSCULAR | Status: DC

## 2018-10-08 MED ORDER — TIROFIBAN HCL IN NACL 5-0.9 MG/100ML-% IV SOLN
INTRAVENOUS | Status: AC
  Filled 2018-10-08: qty 100

## 2018-10-08 MED ORDER — ASPIRIN 81 MG PO CHEW: 324.0000 mg | CHEWABLE_TABLET | Freq: Once | ORAL | Status: DC

## 2018-10-08 MED ORDER — IOHEXOL 350 MG/ML SOLN
INTRAVENOUS | Status: DC | PRN
  Administered 2018-10-08: 140 mL via INTRA_ARTERIAL

## 2018-10-08 MED ORDER — VERAPAMIL HCL 2.5 MG/ML IV SOLN
INTRAVENOUS | Status: AC
  Filled 2018-10-08: qty 2

## 2018-10-08 MED ORDER — FENTANYL CITRATE (PF) 100 MCG/2ML IJ SOLN
INTRAMUSCULAR | Status: DC | PRN
  Administered 2018-10-08 (×2): 25 ug via INTRAVENOUS

## 2018-10-08 MED ORDER — NITROGLYCERIN 1 MG/10 ML FOR IR/CATH LAB
INTRA_ARTERIAL | Status: DC | PRN
  Administered 2018-10-08 (×3): 200 ug via INTRACORONARY

## 2018-10-08 MED ORDER — TICAGRELOR 90 MG PO TABS
ORAL_TABLET | ORAL | Status: DC | PRN
  Administered 2018-10-08: 180 mg via ORAL

## 2018-10-08 MED ORDER — SODIUM CHLORIDE 0.9 % IV SOLN
INTRAVENOUS | Status: DC
  Administered 2018-10-08: 20 mL/h via INTRAVENOUS

## 2018-10-08 MED ORDER — HEPARIN SODIUM (PORCINE) 1000 UNIT/ML IJ SOLN
INTRAMUSCULAR | Status: DC | PRN
  Administered 2018-10-08: 3000 [IU] via INTRAVENOUS
  Administered 2018-10-08: 5000 [IU] via INTRAVENOUS

## 2018-10-08 MED ORDER — FENTANYL CITRATE (PF) 100 MCG/2ML IJ SOLN
INTRAMUSCULAR | Status: AC
  Filled 2018-10-08: qty 2

## 2018-10-08 MED ORDER — TICAGRELOR 90 MG PO TABS
ORAL_TABLET | ORAL | Status: AC
  Filled 2018-10-08: qty 2

## 2018-10-08 MED ORDER — ASPIRIN 81 MG PO CHEW
324.0000 mg | CHEWABLE_TABLET | Freq: Once | ORAL | Status: AC
  Administered 2018-10-08: 324 mg via ORAL
  Filled 2018-10-08: qty 4

## 2018-10-08 MED ORDER — MIDAZOLAM HCL 2 MG/2ML IJ SOLN
INTRAMUSCULAR | Status: DC | PRN
  Administered 2018-10-08 (×2): 2 mg via INTRAVENOUS

## 2018-10-08 MED ORDER — SODIUM CHLORIDE 0.9 % IV SOLN
INTRAVENOUS | Status: AC | PRN
  Administered 2018-10-08: 83 mL/h via INTRAVENOUS

## 2018-10-08 MED ORDER — SODIUM CHLORIDE 0.9 % IV SOLN
INTRAVENOUS | Status: DC | PRN
  Administered 2018-10-08: 10 mL/h via INTRAVENOUS

## 2018-10-08 SURGICAL SUPPLY — 23 items
BALLN SAPPHIRE 2.0X15 (BALLOONS) ×2
BALLN SAPPHIRE 2.0X20 (BALLOONS) ×2
BALLN ~~LOC~~ EUPHORA RX 3.0X12 (BALLOONS) ×2
BALLOON SAPPHIRE 2.0X15 (BALLOONS) ×1 IMPLANT
BALLOON SAPPHIRE 2.0X20 (BALLOONS) ×1 IMPLANT
BALLOON ~~LOC~~ EUPHORA RX 3.0X12 (BALLOONS) ×1 IMPLANT
CATH 5FR JL3.5 JR4 ANG PIG MP (CATHETERS) ×2 IMPLANT
CATH LAUNCHER 5F EBU4.0 (CATHETERS) ×2 IMPLANT
CATH LAUNCHER 6FR EBU 4 (CATHETERS) ×2 IMPLANT
CATH LAUNCHER 6FR EBU3.5 (CATHETERS) ×2 IMPLANT
DEVICE RAD COMP TR BAND LRG (VASCULAR PRODUCTS) ×2 IMPLANT
GLIDESHEATH SLEND SS 6F .021 (SHEATH) ×2 IMPLANT
GUIDEWIRE INQWIRE 1.5J.035X260 (WIRE) ×1 IMPLANT
INQWIRE 1.5J .035X260CM (WIRE) ×2
KIT ENCORE 26 ADVANTAGE (KITS) ×2 IMPLANT
KIT HEART LEFT (KITS) ×2 IMPLANT
PACK CARDIAC CATHETERIZATION (CUSTOM PROCEDURE TRAY) ×2 IMPLANT
STENT ORSIRO 2.75X35 (Permanent Stent) ×2 IMPLANT
SYR MEDRAD MARK 7 150ML (SYRINGE) ×2 IMPLANT
TRANSDUCER W/STOPCOCK (MISCELLANEOUS) ×2 IMPLANT
TUBING CIL FLEX 10 FLL-RA (TUBING) ×2 IMPLANT
WIRE COUGAR XT STRL 190CM (WIRE) ×2 IMPLANT
WIRE HI TORQ WHISPER MS 190CM (WIRE) ×2 IMPLANT

## 2018-10-08 NOTE — H&P (Signed)
CARDIOLOGY H&P  HPI:  Patient is a 57 y/o M with hx of restless leg syndrome, HLP (on crestor 5mg  and zetia), untreated recent dx of HTN comes in with chest pain that initially started on christmas eve. Pain at this time was only exertional and resolved with rest. He had similar symptoms on the day of christmas. This morning given his symptoms, he went to his PCP who ordered an EKG without significant findings. However, around 7 pm tonight after coming home from work he started to experience substernal chest pressure that was 5/10, non-radiating and non-exertional that prompted him to come to the ER. On arrival, he was found to have subtle ST elevations in the inferior leads prompting a call to Dr. Burt Knack.   Initial troponin the ER was 3.39. He was loaded with 324mg  of asa and heparin was administered. He also received one sublingual NTG which dropped his BP into the 50'K systolic transiently, but this improved with fluid bolus of 500cc. Patient pain improved some with the sublingual NTG, but recurred again shortly after.  Patient had a negative treadmill study in 06/2017 and had an echo which showed preserved EF 55-60%.  Review of Systems:     Cardiac Review of Systems: {Y] = yes [ ]  = no  Chest Pain [ Y   ]  Resting SOB [   ] Exertional SOB  [ Y ]  Orthopnea [  ]   Pedal Edema [   ]    Palpitations [  ] Syncope  [  ]   Presyncope [   ]  General Review of Systems: [Y] = yes [  ]=no Constitional: recent weight change [  ]; anorexia [  ]; fatigue [  ]; nausea [  ]; night sweats [  ]; fever [  ]; or chills [  ];                                                                     Dental: poor dentition[  ];   Eye : blurred vision [  ]; diplopia [   ]; vision changes [  ];  Amaurosis fugax[  ]; Resp: cough [  ];  wheezing[  ];  hemoptysis[  ]; shortness of breath[  ]; paroxysmal nocturnal dyspnea[  ]; dyspnea on exertion[  ]; or orthopnea[  ];  GI:  gallstones[  ], vomiting[  ];  dysphagia[  ];  melena[  ];  hematochezia [  ]; heartburn[  ];   GU: kidney stones [  ]; hematuria[  ];   dysuria [  ];  nocturia[  ];               Skin: rash [  ], swelling[  ];, hair loss[  ];  peripheral edema[  ];  or itching[  ]; Musculosketetal: myalgias[  ];  joint swelling[  ];  joint erythema[  ];  joint pain[  ];  back pain[  ];  Heme/Lymph: bruising[  ];  bleeding[  ];  anemia[  ];  Neuro: TIA[  ];  headaches[  ];  stroke[  ];  vertigo[  ];  seizures[  ];   paresthesias[  ];  difficulty walking[  ];  Psych:depression[  ];  anxiety[  ];  Endocrine: diabetes[  ];  thyroid dysfunction[  ];  Other:  Past Medical History:  Diagnosis Date  . Back pain   . Carpal tunnel syndrome   . DDD (degenerative disc disease)   . Hyperlipemia   . Tubular adenocarcinoma ([place]) 2010   colon   Asa 81mg  Carbidopa/levadopa Lipitotr   Allergies  Allergen Reactions  . Shellfish Allergy Anaphylaxis   Social History   Socioeconomic History  . Marital status: Married    Spouse name: Not on file  . Number of children: Not on file  . Years of education: Not on file  . Highest education level: Not on file  Occupational History  . Not on file  Social Needs  . Financial resource strain: Not on file  . Food insecurity:    Worry: Not on file    Inability: Not on file  . Transportation needs:    Medical: Not on file    Non-medical: Not on file  Tobacco Use  . Smoking status: Never Smoker  . Smokeless tobacco: Never Used  Substance and Sexual Activity  . Alcohol use: Yes    Comment: occ  . Drug use: No  . Sexual activity: Not on file  Lifestyle  . Physical activity:    Days per week: Not on file    Minutes per session: Not on file  . Stress: Not on file  Relationships  . Social connections:    Talks on phone: Not on file    Gets together: Not on file    Attends religious service: Not on file    Active member of club or organization: Not on file    Attends meetings of clubs or organizations: Not  on file    Relationship status: Not on file  . Intimate partner violence:    Fear of current or ex partner: Not on file    Emotionally abused: Not on file    Physically abused: Not on file    Forced sexual activity: Not on file  Other Topics Concern  . Not on file  Social History Narrative  . Not on file   History reviewed. No pertinent family history.  PHYSICAL EXAM: Vitals:   10/08/18 2200 10/08/18 2215  BP: (!) 154/100 133/87  Pulse: 92 93  Resp: (!) 21 (!) 29  Temp:    SpO2: 94% 93%   General:  Well appearing. No respiratory difficulty HEENT: normal Neck: supple. no JVD. No lymphadenopathy or thryomegaly appreciated. Cor: PMI nondisplaced. Regular, tachycardic. No murmurs. Lungs: clear Abdomen: soft, nontender, nondistended. No hepatosplenomegaly. No bruits or masses. Good bowel sounds. Extremities: no cyanosis, clubbing, rash, edema Neuro: alert & oriented x 3, cranial nerves grossly intact. moves all 4 extremities w/o difficulty. Affect pleasant.  ECG:  Results for orders placed or performed during the hospital encounter of 10/08/18 (from the past 24 hour(s))  CBC     Status: Abnormal   Collection Time: 10/08/18  9:44 PM  Result Value Ref Range   WBC 11.3 (H) 4.0 - 10.5 K/uL   RBC 5.24 4.22 - 5.81 MIL/uL   Hemoglobin 16.8 13.0 - 17.0 g/dL   HCT 49.0 39.0 - 52.0 %   MCV 93.5 80.0 - 100.0 fL   MCH 32.1 26.0 - 34.0 pg   MCHC 34.3 30.0 - 36.0 g/dL   RDW 12.6 11.5 - 15.5 %   Platelets 262 150 - 400 K/uL   nRBC 0.0 0.0 - 0.2 %  I-stat troponin, ED  Status: Abnormal   Collection Time: 10/08/18  9:48 PM  Result Value Ref Range   Troponin i, poc 3.39 (HH) 0.00 - 0.08 ng/mL   Comment NOTIFIED PHYSICIAN    Comment 3           Dg Chest Portable 1 View  Result Date: 10/08/2018 CLINICAL DATA:  ST-elevation myocardial infarction. EXAM: PORTABLE CHEST 1 VIEW COMPARISON:  Chest radiograph performed 04/04/2015 FINDINGS: The lungs are well-aerated and clear. There is  no evidence of focal opacification, pleural effusion or pneumothorax. The cardiomediastinal silhouette is within normal limits. No acute osseous abnormalities are seen. IMPRESSION: No acute cardiopulmonary process seen. Electronically Signed   By: Garald Balding M.D.   On: 10/08/2018 22:17   ASSESSMENT:  Inferior ST elevation MI (subte) Recent hx of HTN (untreated), HLP Premature heart disease (family hx)  PLAN/DISCUSSION:  Patient taken to the cath lab emergently. Found to have an 100% ostial circ lesion s/p PCI with 2.75 x 72mm orsiro DES. Patient has residual disease in the RPLC at the origin of second PL and dRCA 40% lesion. Will obtain TSH, A1c, Lipid panel, echo in the AM. Will continue asa 81mg , and brilinta 90mg , BID, along with high intensity statin and will consider lopressor (12.5mg , BID) therapy depending on his Hr's and BP.  Willeen Cass (Cardiology fellow)

## 2018-10-08 NOTE — ED Provider Notes (Signed)
[facility] EMERGENCY DEPARTMENT Provider Note   CSN: [id number] Arrival date & time: 10/08/18  2127     History   Chief Complaint Chief Complaint  Patient presents with  . Chest Pain  . Code STEMI    HPI Andrew Mitchell is a 57 y.o. male.  The history is provided by the patient and medical records. No language interpreter was used.   Andrew Mitchell is a 57 y.o. male who presents to the Emergency Department complaining of chest pain. As to the emergency department complaining of chest pain that is been intermittent since Christmas eve. Pain is described as a dull pressure like sensation in the left chest. For the last two days it has been waxing and waning with minimal activity. He has  decreased exercise tolerance. He did have an episode of diaphoresis two days ago. He has a history of hyperlipidemia as well as extensive family history of coronary artery disease. No prior similar symptoms. He saw his PCP earlier today for similar symptoms. Today his pain has come on at rest.    Past Medical History:  Diagnosis Date  . Back pain   . Carpal tunnel syndrome   . DDD (degenerative disc disease)   . Hyperlipemia   . Tubular adenocarcinoma ([place]) 2010   colon    Patient Active Problem List   Diagnosis Date Noted  . STEMI (ST elevation myocardial infarction) ([place]) 10/08/2018  . STEMI involving left circumflex coronary artery ([place]) 10/08/2018  . CAD in native artery 06/01/2017  . DOE (dyspnea on exertion) 06/01/2017  . ADHD, predominantly inattentive type 05/16/2017  . Hyperlipemia 05/16/2017  . Restless leg syndrome 05/16/2017  . Sinusitis 05/16/2017  . Abnormality of gait 02/21/2017  . Stress reaction of foot with routine healing, left 02/21/2017    Past Surgical History:  Procedure Laterality Date  . CARPAL TUNNEL RELEASE Left 01/06/2013   Procedure: CARPAL TUNNEL RELEASE;  Surgeon: Wynonia Sours, MD;  Location: [place];  Service:  Orthopedics;  Laterality: Left;  ANESTHESIA: IV REGIONAL FAB  . COLONOSCOPY    . KNEE ARTHROSCOPY     rightx2  . RIB RESECTION  2003   thorasic outlet syndrome-rt  . SHOULDER ARTHROSCOPY     rightx2  . TONSILLECTOMY          Home Medications    Prior to Admission medications   Medication Sig Start Date End Date Taking? Authorizing Provider  aspirin 81 MG tablet Take 81 mg by mouth daily.    [provider]  carbidopa-levodopa (SINEMET IR) 25-100 MG per tablet Take 1/2 to 1 tablet by mouth daily before bedtime.    [provider]  diclofenac (VOLTAREN) 75 MG EC tablet Take 75 mg by mouth daily as needed for mild pain or moderate pain.  07/24/10   [provider]  ezetimibe (ZETIA) 10 MG tablet Take 1 tablet (10 mg total) by mouth daily. Please make overdue appt with Dr. Tamala Julian before anymore refills. 2nd attempt 09/04/18   Belva Crome, MD  ibuprofen (ADVIL,MOTRIN) 200 MG tablet Take 200 mg by mouth every 4 (four) hours as needed for fever or moderate pain.  03/08/10   [provider]  Javier Docker Oil 1000 MG CAPS Take 1,000 mg by mouth daily.    [provider]  lisdexamfetamine (VYVANSE) 60 MG capsule Take 60 mg by mouth every morning.    [provider]  Multiple Vitamins-Minerals (CENTRUM PO) Take 1 tablet by  mouth daily.    [provider]  rosuvastatin (CRESTOR) 5 MG tablet Take 1 tablet twice weekly and increase as tolerated. Please make overdue appt with Dr. Tamala Julian before anymore refills. 2nd attempt 09/04/18   Belva Crome, MD  saw palmetto 160 MG capsule Take 320 mg by mouth daily.    [provider]  sildenafil (REVATIO) 20 MG tablet Take 2 to 5 tablets (40 to 100 mg) by mouth daily as needed for erectile dysfunction.    [provider]  vitamin B-12 (CYANOCOBALAMIN) 500 MCG tablet Take 1,500 mcg by mouth daily.    [provider]    Family History History reviewed. No pertinent family  history.  Social History Social History   Tobacco Use  . Smoking status: Never Smoker  . Smokeless tobacco: Never Used  Substance Use Topics  . Alcohol use: Yes    Comment: occ  . Drug use: No     Allergies   Shellfish allergy   Review of Systems Review of Systems  All other systems reviewed and are negative.    Physical Exam Updated Vital Signs BP (!) 151/92   Pulse 72   Temp 98 F (36.7 C) (Oral)   Resp 14   Wt 82.6 kg   SpO2 98%   BMI 26.11 kg/m   Physical Exam Vitals signs and nursing note reviewed.  Constitutional:      Appearance: He is well-developed.  HENT:     Head: Normocephalic and atraumatic.  Cardiovascular:     Rate and Rhythm: Normal rate and regular rhythm.     Heart sounds: No murmur.  Pulmonary:     Effort: Pulmonary effort is normal. No respiratory distress.     Breath sounds: Normal breath sounds.  Abdominal:     Palpations: Abdomen is soft.     Tenderness: There is no abdominal tenderness. There is no guarding or rebound.  Musculoskeletal:        General: No tenderness.  Skin:    General: Skin is warm and dry.  Neurological:     Mental Status: He is alert and oriented to person, place, and time.  Psychiatric:        Behavior: Behavior normal.      ED Treatments / Results  Labs (all labs ordered are listed, but only abnormal results are displayed) Labs Reviewed  CBC - Abnormal; Notable for the following components:      Result Value   WBC 11.3 (*)    All other components within normal limits  COMPREHENSIVE METABOLIC PANEL - Abnormal; Notable for the following components:   Glucose, Bld 114 (*)    Creatinine, Ser 1.36 (*)    AST 74 (*)    GFR calc non Af Amer 57 (*)    All other components within normal limits  TROPONIN I - Abnormal; Notable for the following components:   Troponin I 3.53 (*)    All other components within normal limits  LIPID PANEL - Abnormal; Notable for the following components:   Triglycerides  182 (*)    All other components within normal limits  I-STAT TROPONIN, ED - Abnormal; Notable for the following components:   Troponin i, poc 3.39 (*)    All other components within normal limits  MRSA PCR SCREENING  PROTIME-INR  APTT  HIV ANTIBODY (ROUTINE TESTING W REFLEX)  CBC  CREATININE, SERUM  TROPONIN I  TROPONIN I  TROPONIN I  LIPID PANEL  BASIC METABOLIC PANEL  CBC  EKG EKG Interpretation  Date/Time:  Thursday October 08 2018 21:38:21 EST Ventricular Rate:  97 PR Interval:  164 QRS Duration: 74 QT Interval:  332 QTC Calculation: 421 R Axis:   0 Text Interpretation:  ** ** ACUTE MI / STEMI ** ** Normal sinus rhythm Possible Left atrial enlargement Cannot rule out Anterior infarct , age undetermined Inferolateral injury pattern ** ** ACUTE MI / STEMI ** ** Abnormal ECG Confirmed by Quintella Reichert [phone]) on 10/08/2018 9:50:39 PM   Radiology Dg Chest Portable 1 View  Result Date: 10/08/2018 CLINICAL DATA:  ST-elevation myocardial infarction. EXAM: PORTABLE CHEST 1 VIEW COMPARISON:  Chest radiograph performed 04/04/2015 FINDINGS: The lungs are well-aerated and clear. There is no evidence of focal opacification, pleural effusion or pneumothorax. The cardiomediastinal silhouette is within normal limits. No acute osseous abnormalities are seen. IMPRESSION: No acute cardiopulmonary process seen. Electronically Signed   By: Garald Balding M.D.   On: 10/08/2018 22:17    Procedures Procedures (including critical care time) CRITICAL CARE Performed by: Quintella Reichert   Total critical care time: 35 minutes  Critical care time was exclusive of separately billable procedures and treating other patients.  Critical care was necessary to treat or prevent imminent or life-threatening deterioration.  Critical care was time spent personally by me on the following activities: development of treatment plan with patient and/or surrogate as well as nursing, discussions with  consultants, evaluation of patient's response to treatment, examination of patient, obtaining history from patient or surrogate, ordering and performing treatments and interventions, ordering and review of laboratory studies, ordering and review of radiographic studies, pulse oximetry and re-evaluation of patient's condition.  Medications Ordered in ED Medications  0.9 %  sodium chloride infusion (20 mL/hr Intravenous New Bag/Given 10/08/18 2208)  aspirin chewable tablet 81 mg (has no administration in time range)  carbidopa-levodopa (SINEMET IR) 25-100 MG per tablet immediate release 0.5 tablet (0.5 tablets Oral Not Given 10/09/18 0036)  ezetimibe (ZETIA) tablet 10 mg (has no administration in time range)  lisdexamfetamine (VYVANSE) capsule 60 mg (has no administration in time range)  multivitamin with minerals tablet 1 tablet (has no administration in time range)  nitroGLYCERIN (NITROSTAT) SL tablet 0.4 mg (has no administration in time range)  acetaminophen (TYLENOL) tablet 650 mg (has no administration in time range)  ondansetron (ZOFRAN) injection 4 mg (has no administration in time range)  heparin injection 5,000 Units (has no administration in time range)  ticagrelor (BRILINTA) tablet 90 mg (has no administration in time range)  metoprolol tartrate (LOPRESSOR) tablet 12.5 mg (12.5 mg Oral Given 10/09/18 0034)  atorvastatin (LIPITOR) tablet 40 mg (has no administration in time range)  labetalol (NORMODYNE,TRANDATE) injection 10 mg (has no administration in time range)  hydrALAZINE (APRESOLINE) injection 5 mg (has no administration in time range)  0.9% sodium chloride infusion (1 mL/kg/hr  82.6 kg Intravenous New Bag/Given 10/09/18 0024)  sodium chloride flush (NS) 0.9 % injection 3 mL (has no administration in time range)  sodium chloride flush (NS) 0.9 % injection 3 mL (has no administration in time range)  0.9 %  sodium chloride infusion (has no administration in time range)    oxyCODONE (Oxy IR/ROXICODONE) immediate release tablet 5-10 mg (has no administration in time range)  morphine 2 MG/ML injection 2 mg (has no administration in time range)  aspirin chewable tablet 324 mg (324 mg Oral Given 10/08/18 2203)  heparin injection 4,000 Units (4,000 Units Intravenous Given 10/08/18 2203)  0.9 %  sodium chloride infusion (83  mL/hr Intravenous New Bag/Given 10/08/18 2235)     Initial Impression / Assessment and Plan / ED Course  I have reviewed the triage vital signs and the nursing notes.  Pertinent labs & imaging results that were available during my care of the patient were reviewed by me and considered in my medical decision making (see chart for details).     Patient here for evaluation of intermittent chest pain since New Year's Eve. He has 5/10 pain on ED arrival. EKG does have acute ST elevation in the inferior leads concerning for ST elevation MI. A STEMI was activated and he was treated with aspirin, nitroglycerin and heparin. He was transferred emergently to the cath lab for further intervention.  Final Clinical Impressions(s) / ED Diagnoses   Final diagnoses:  STEMI (ST elevation myocardial infarction) [facility])    ED Discharge Orders    None       Quintella Reichert, MD 10/09/18 [phone]

## 2018-10-08 NOTE — ED Triage Notes (Signed)
Pt here with chest pain for the day.  Seen PCP earlier in the day then the pain got worse.  Christmas eve had pain as well and got really weak.  A&Ox4 at this time.

## 2018-10-08 NOTE — ED Notes (Signed)
To cath lab.

## 2018-10-09 ENCOUNTER — Inpatient Hospital Stay (HOSPITAL_COMMUNITY): Payer: 59

## 2018-10-09 ENCOUNTER — Encounter (HOSPITAL_COMMUNITY): Payer: Self-pay | Admitting: Cardiovascular Disease

## 2018-10-09 DIAGNOSIS — I10 Essential (primary) hypertension: Secondary | ICD-10-CM | POA: Diagnosis present

## 2018-10-09 DIAGNOSIS — I25110 Atherosclerotic heart disease of native coronary artery with unstable angina pectoris: Secondary | ICD-10-CM

## 2018-10-09 DIAGNOSIS — I5030 Unspecified diastolic (congestive) heart failure: Secondary | ICD-10-CM

## 2018-10-09 DIAGNOSIS — G2581 Restless legs syndrome: Secondary | ICD-10-CM

## 2018-10-09 DIAGNOSIS — E7849 Other hyperlipidemia: Secondary | ICD-10-CM

## 2018-10-09 LAB — BASIC METABOLIC PANEL
Anion gap: 14 (ref 5–15)
BUN: 16 mg/dL (ref 6–20)
CALCIUM: 8.8 mg/dL — AB (ref 8.9–10.3)
CO2: 21 mmol/L — ABNORMAL LOW (ref 22–32)
Chloride: 102 mmol/L (ref 98–111)
Creatinine, Ser: 0.96 mg/dL (ref 0.61–1.24)
GFR calc Af Amer: >60 mL/min (ref >60)
GFR calc non Af Amer: >60 mL/min (ref >60)
Glucose, Bld: 108 mg/dL — ABNORMAL HIGH (ref 70–99)
Potassium: 3.7 mmol/L (ref 3.5–5.1)
SODIUM: 137 mmol/L (ref 135–145)

## 2018-10-09 LAB — CBC
HCT: 43.1 % (ref 39.0–52.0)
HCT: 43.6 % (ref 39.0–52.0)
Hemoglobin: 15.1 g/dL (ref 13.0–17.0)
Hemoglobin: 15.3 g/dL (ref 13.0–17.0)
MCH: 32.8 pg (ref 26.0–34.0)
MCH: 33.1 pg (ref 26.0–34.0)
MCHC: 35 g/dL (ref 30.0–36.0)
MCHC: 35.1 g/dL (ref 30.0–36.0)
MCV: 93.7 fL (ref 80.0–100.0)
MCV: 94.4 fL (ref 80.0–100.0)
Platelets: 243 10*3/uL (ref 150–400)
Platelets: 236 10*3/uL (ref 150–400)
RBC: 4.6 MIL/uL (ref 4.22–5.81)
RBC: 4.62 MIL/uL (ref 4.22–5.81)
RDW: 12.8 % (ref 11.5–15.5)
RDW: 12.9 % (ref 11.5–15.5)
WBC: 10.2 10*3/uL (ref 4.0–10.5)
WBC: 9 10*3/uL (ref 4.0–10.5)
nRBC: 0 % (ref 0.0–0.2)
nRBC: 0 % (ref 0.0–0.2)

## 2018-10-09 LAB — ECHOCARDIOGRAM COMPLETE
Height: 70 in
Weight: 2912 oz

## 2018-10-09 LAB — POCT ACTIVATED CLOTTING TIME
Activated Clotting Time: 224 seconds
Activated Clotting Time: 268 seconds

## 2018-10-09 LAB — POCT I-STAT, CHEM 8
BUN: 19 mg/dL (ref 6–20)
CALCIUM ION: 1.31 mmol/L (ref 1.15–1.40)
Chloride: 103 mmol/L (ref 98–111)
Creatinine, Ser: 1 mg/dL (ref 0.61–1.24)
Glucose, Bld: 131 mg/dL — ABNORMAL HIGH (ref 70–99)
HCT: 41 % (ref 39.0–52.0)
Hemoglobin: 13.9 g/dL (ref 13.0–17.0)
Potassium: 3.2 mmol/L — ABNORMAL LOW (ref 3.5–5.1)
Sodium: 138 mmol/L (ref 135–145)
TCO2: 26 mmol/L (ref 22–32)

## 2018-10-09 LAB — MRSA PCR SCREENING: MRSA BY PCR: NEGATIVE

## 2018-10-09 LAB — TROPONIN I
Troponin I: 13.18 ng/mL (ref <0.03)
Troponin I: 30.58 ng/mL (ref <0.03)
Troponin I: 16.13 ng/mL (ref <0.03)

## 2018-10-09 LAB — LIPID PANEL
CHOLESTEROL: 126 mg/dL (ref 0–200)
HDL: 38 mg/dL — AB (ref >40)
LDL Cholesterol: 81 mg/dL (ref 0–99)
Total CHOL/HDL Ratio: 3.3 RATIO
Triglycerides: 36 mg/dL (ref <150)
VLDL: 7 mg/dL (ref 0–40)

## 2018-10-09 LAB — CREATININE, SERUM
Creatinine, Ser: 1.08 mg/dL (ref 0.61–1.24)
GFR calc Af Amer: >60 mL/min (ref >60)
GFR calc non Af Amer: >60 mL/min (ref >60)

## 2018-10-09 LAB — HIV ANTIBODY (ROUTINE TESTING W REFLEX): HIV Screen 4th Generation wRfx: NONREACTIVE

## 2018-10-09 MED ORDER — OXYCODONE HCL 5 MG PO TABS: 5.0000 mg | ORAL_TABLET | ORAL | Status: DC | PRN

## 2018-10-09 MED ORDER — TICAGRELOR 90 MG PO TABS
90.0000 mg | ORAL_TABLET | Freq: Two times a day (BID) | ORAL | Status: DC
  Administered 2018-10-09 – 2018-10-10 (×3): 90 mg via ORAL
  Filled 2018-10-09 (×3): qty 1

## 2018-10-09 MED ORDER — LISDEXAMFETAMINE DIMESYLATE 30 MG PO CAPS
60.0000 mg | ORAL_CAPSULE | ORAL | Status: DC
  Administered 2018-10-09 – 2018-10-10 (×2): 60 mg via ORAL
  Filled 2018-10-09 (×2): qty 2

## 2018-10-09 MED ORDER — SODIUM CHLORIDE 0.9 % WEIGHT BASED INFUSION
1.0000 mL/kg/h | INTRAVENOUS | Status: AC
  Administered 2018-10-09 (×2): 1 mL/kg/h via INTRAVENOUS

## 2018-10-09 MED ORDER — HEPARIN SODIUM (PORCINE) 5000 UNIT/ML IJ SOLN
5000.0000 [IU] | Freq: Three times a day (TID) | INTRAMUSCULAR | Status: DC
  Administered 2018-10-09 – 2018-10-10 (×3): 5000 [IU] via SUBCUTANEOUS
  Filled 2018-10-09 (×3): qty 1

## 2018-10-09 MED ORDER — CARBIDOPA-LEVODOPA 25-100 MG PO TABS: 0.5000 | ORAL_TABLET | Freq: Every day | ORAL | Status: DC

## 2018-10-09 MED ORDER — LABETALOL HCL 5 MG/ML IV SOLN
10.0000 mg | INTRAVENOUS | Status: AC | PRN
  Filled 2018-10-09: qty 4

## 2018-10-09 MED ORDER — CARBIDOPA-LEVODOPA 25-100 MG PO TABS
0.5000 | ORAL_TABLET | Freq: Every day | ORAL | Status: DC
  Administered 2018-10-09: 0.5 via ORAL
  Filled 2018-10-09: qty 0.5

## 2018-10-09 MED ORDER — ACETAMINOPHEN 325 MG PO TABS: 650.0000 mg | ORAL_TABLET | ORAL | Status: DC | PRN

## 2018-10-09 MED ORDER — EZETIMIBE 10 MG PO TABS
10.0000 mg | ORAL_TABLET | Freq: Every day | ORAL | Status: DC
  Administered 2018-10-09 – 2018-10-10 (×2): 10 mg via ORAL
  Filled 2018-10-09 (×2): qty 1

## 2018-10-09 MED ORDER — SODIUM CHLORIDE 0.9 % IV SOLN: 250.0000 mL | INTRAVENOUS | Status: DC | PRN

## 2018-10-09 MED ORDER — ONDANSETRON HCL 4 MG/2ML IJ SOLN: 4.0000 mg | Freq: Four times a day (QID) | INTRAMUSCULAR | Status: DC | PRN

## 2018-10-09 MED ORDER — MORPHINE SULFATE (PF) 2 MG/ML IV SOLN
2.0000 mg | INTRAVENOUS | Status: DC | PRN
  Administered 2018-10-09: 2 mg via INTRAVENOUS
  Filled 2018-10-09: qty 1

## 2018-10-09 MED ORDER — ATORVASTATIN CALCIUM 40 MG PO TABS: 40.0000 mg | ORAL_TABLET | Freq: Every day | ORAL | Status: DC

## 2018-10-09 MED ORDER — METOPROLOL TARTRATE 12.5 MG HALF TABLET
12.5000 mg | ORAL_TABLET | Freq: Two times a day (BID) | ORAL | Status: DC
  Administered 2018-10-09: 12.5 mg via ORAL
  Filled 2018-10-09: qty 1

## 2018-10-09 MED ORDER — HYDRALAZINE HCL 20 MG/ML IJ SOLN: 5.0000 mg | INTRAMUSCULAR | Status: AC | PRN

## 2018-10-09 MED ORDER — SODIUM CHLORIDE 0.9% FLUSH: 3.0000 mL | INTRAVENOUS | Status: DC | PRN

## 2018-10-09 MED ORDER — ADULT MULTIVITAMIN W/MINERALS CH
1.0000 | ORAL_TABLET | Freq: Every day | ORAL | Status: DC
  Administered 2018-10-09 – 2018-10-10 (×2): 1 via ORAL
  Filled 2018-10-09 (×2): qty 1

## 2018-10-09 MED ORDER — ROSUVASTATIN CALCIUM 20 MG PO TABS
40.0000 mg | ORAL_TABLET | Freq: Every day | ORAL | Status: DC
  Administered 2018-10-09: 40 mg via ORAL
  Filled 2018-10-09: qty 2

## 2018-10-09 MED ORDER — PERFLUTREN LIPID MICROSPHERE
1.0000 mL | INTRAVENOUS | Status: AC | PRN
  Administered 2018-10-09: 3 mL via INTRAVENOUS
  Filled 2018-10-09: qty 10

## 2018-10-09 MED ORDER — ASPIRIN 81 MG PO CHEW
81.0000 mg | CHEWABLE_TABLET | Freq: Every day | ORAL | Status: DC
  Administered 2018-10-09 – 2018-10-10 (×2): 81 mg via ORAL
  Filled 2018-10-09 (×2): qty 1

## 2018-10-09 MED ORDER — SODIUM CHLORIDE 0.9% FLUSH
3.0000 mL | Freq: Two times a day (BID) | INTRAVENOUS | Status: DC
  Administered 2018-10-09 – 2018-10-10 (×3): 3 mL via INTRAVENOUS

## 2018-10-09 MED ORDER — METOPROLOL TARTRATE 25 MG PO TABS
25.0000 mg | ORAL_TABLET | Freq: Two times a day (BID) | ORAL | Status: DC
  Administered 2018-10-09 – 2018-10-10 (×3): 25 mg via ORAL
  Filled 2018-10-09 (×3): qty 1

## 2018-10-09 MED ORDER — NITROGLYCERIN 0.4 MG SL SUBL: 0.4000 mg | SUBLINGUAL_TABLET | SUBLINGUAL | Status: DC | PRN

## 2018-10-09 MED FILL — Verapamil HCl IV Soln 2.5 MG/ML: INTRAVENOUS | Qty: 2 | Status: AC

## 2018-10-09 NOTE — Care Management (Signed)
Brilinta benefits check sent and pending. CM team will follow to discuss est monthly copay amount and provide the Brilinta card once cost has been determined.   Maud Deed. Ailsa Mireles BSN, NCM-BC, ACM-RN

## 2018-10-09 NOTE — Progress Notes (Signed)
  Echocardiogram 2D Echocardiogram has been performed.  Andrew Mitchell 10/09/2018, 3:39 PM

## 2018-10-09 NOTE — Progress Notes (Signed)
CARDIAC REHAB PHASE I   PRE:  Rate/Rhythm: 78 SR  BP:  Supine:   Sitting: 139/84  Standing:    SaO2: 96%RA  MODE:  Ambulation: 370 ft   POST:  Rate/Rhythm: 91 SR  BP:  Supine: 114/79  Sitting:   Standing:    SaO2: 98%RA 1340-1430 Pt walked 370 ft on RA with steady gait and no CP or dizziness. MI education completed with pt and family who voiced understanding. Reviewed NTG use, MI restrictions, ex ed, heart healthy food choices, CRP 2, importance of brilinta with stent. Needs to see case manager. Referred to [place] CRP 2.   Graylon Good, RN BSN  10/09/2018 2:27 PM

## 2018-10-09 NOTE — Care Management Note (Signed)
Case Management Note  Patient Details  Name: Andrew Mitchell MRN: [id number] Date of Birth: [birth year]  Subjective/Objective: 57 yo male presented with CP; s/p cath with PCI to major circumflex-OM branch.                Action/Plan: Brilinta benefits check complete with est monthly copay cost $40. CM attempted to discuss with patient and provide Brilinta copay card, with patient unavailable receiving a procedure at this time. CM team will continue to follow.   Expected Discharge Date:                  Expected Discharge Plan:  Home/Self Care  In-House Referral:  NA  Discharge planning Services  CM Consult, Medication Assistance(Benefits check)  Post Acute Care Choice:  NA Choice offered to:  NA  DME Arranged:  N/A DME Agency:  NA  HH Arranged:  NA HH Agency:  NA  Status of Service:  In process, will continue to follow  If discussed at Long Length of Stay Meetings, dates discussed:    Additional Comments:  Midge Minium RN, BSN, NCM-BC, ACM-RN [phone] 10/09/2018, 2:46 PM

## 2018-10-09 NOTE — Progress Notes (Addendum)
Progress Note  Patient Name: Andrew Mitchell Date of Encounter: 10/09/2018  Primary Cardiologist: Sinclair Grooms, MD -last seen in August 2018  Subjective   No chest pain or pressure.  Right wrist radial site is stable.  No sensation of palpitations or dyspnea.  Inpatient Medications    Scheduled Meds: . aspirin  81 mg Oral Daily  . atorvastatin  40 mg Oral q1800  . carbidopa-levodopa  0.5 tablet Oral QHS  . ezetimibe  10 mg Oral Daily  . heparin  5,000 Units Subcutaneous Q8H  . lisdexamfetamine  60 mg Oral BH-q7a  . metoprolol tartrate  12.5 mg Oral BID  . multivitamin with minerals  1 tablet Oral Daily  . sodium chloride flush  3 mL Intravenous Q12H  . ticagrelor  90 mg Oral BID   Continuous Infusions: . sodium chloride 20 mL/hr (10/08/18 2208)  . sodium chloride    . sodium chloride 1 mL/kg/hr (10/09/18 0024)   PRN Meds: sodium chloride, acetaminophen, morphine injection, nitroGLYCERIN, ondansetron (ZOFRAN) IV, oxyCODONE, sodium chloride flush   Vital Signs    Vitals:   10/09/18 0540 10/09/18 0600 10/09/18 0700 10/09/18 0730  BP:  133/83 (!) 151/102   Pulse: 77 73 65   Resp: (!) 21 19 12    Temp:    98.2 F (36.8 C)  TempSrc:    Oral  SpO2: 100% 96% 98%   Weight:        Intake/Output Summary (Last 24 hours) at 10/09/2018 0853 Last data filed at 10/09/2018 0300 Gross per 24 hour  Intake 214.76 ml  Output 600 ml  Net -385.24 ml   Filed Weights   10/08/18 2139  Weight: 82.6 kg    Telemetry    Mostly normal sinus rhythm rates in the 80s and 90s.  Occasionally in the low 100s.  Rare intermittent PVCs with 1 3 beat triplet.- Personally Reviewed  ECG    Post PCI EKG: Sinus rhythm with fusion complex.  67 bpm.  No residual inferior ST elevation with small Q wave in lead III. A.M. EKG: Sinus rhythm, 61 bpm.  Sinus arrhythmia noted.  More notable inferior Q waves with low voltage in the inferior leads (by convention would be inferior infarct, age  undetermined).  Otherwise normal axis, intervals durations.. Both Personally Reviewed  Physical Exam   GEN: Resting comfortably in bed, No acute distress.   Neck: No JVD or carotid bruit Cardiac:  Normal S1-S2.  RRR, no murmurs, rubs, or gallops.  Respiratory: Clear to auscultation bilaterally.  Nonlabored, good air movement GI: Soft, nontender, non-distended; NABS MS: No clubbing/cyanosis/edema; No deformity. Neuro:  Nonfocal  Psych: Normal mood and affect   Labs    Chemistry Recent Labs  Lab 10/08/18 2145 10/09/18 0028 10/09/18 0557  NA 139  --  137  K 4.2  --  3.7  CL 102  --  102  CO2 29  --  21*  GLUCOSE 114*  --  108*  BUN 18  --  16  CREATININE 1.36* 1.08 0.96  CALCIUM 10.0  --  8.8*  PROT 7.4  --   --   ALBUMIN 4.3  --   --   AST 74*  --   --   ALT 18  --   --   ALKPHOS 66  --   --   BILITOT 1.2  --   --   GFRNONAA 57* >60 >60  GFRAA >60 >60 >60  ANIONGAP 8  --  14     Hematology Recent Labs  Lab 10/08/18 2144 10/09/18 0028 10/09/18 0557  WBC 11.3* 9.0 10.2  RBC 5.24 4.60 4.62  HGB 16.8 15.1 15.3  HCT 49.0 43.1 43.6  MCV 93.5 93.7 94.4  MCH 32.1 32.8 33.1  MCHC 34.3 35.0 35.1  RDW 12.6 12.8 12.9  PLT 262 243 236    Cardiac Enzymes Recent Labs  Lab 10/08/18 2145 10/09/18 0028 10/09/18 0557  TROPONINI 3.53* 13.18* 30.58*    Recent Labs  Lab 10/08/18 2148  TROPIPOC 3.39*     BNPNo results for input(s): BNP, PROBNP in the last 168 hours.   DDimer No results for input(s): DDIMER in the last 168 hours.   Radiology    Dg Chest Portable 1 View  Result Date: 10/08/2018 CLINICAL DATA:  ST-elevation myocardial infarction. EXAM: PORTABLE CHEST 1 VIEW COMPARISON:  Chest radiograph performed 04/04/2015 FINDINGS: The lungs are well-aerated and clear. There is no evidence of focal opacification, pleural effusion or pneumothorax. The cardiomediastinal silhouette is within normal limits. No acute osseous abnormalities are seen. IMPRESSION: No  acute cardiopulmonary process seen. Electronically Signed   By: Garald Balding M.D.   On: 10/08/2018 22:17    Cardiac Studies    CARDIAC CATH-PCI 10/08/2018: Ost-Prox Cx 100% - PCI Orsiro DES 2.75 mm x 35 mm (3.1 mm) -tortuous vessel with difficult to cross lesion.dRCA 40%. PAV-PL 90% (med Rx for now).  Patient Profile     57 y.o. male with PMH of hypertension and hyperlipidemia (on low-dose Crestor plus Zetia) along with restless leg syndrome who is been having intermittent chest pain since Christmas Eve (05/06/2018).  Initially noted exertional chest pain; however 7 PM on the 26th he started noticing worsening exertional pain that then became nonexertional.  At that time he decided to come to the ER.  Upon arrival he was noted to have inferior ST elevations on EKG and inferior STEMI was called.  He already had a troponin level 3.39 at the time of initial arrival.  Taken emergently to cardiac catheterization lab by Dr. Burt Knack where he had PCI to major circumflex-OM branch.  Assessment & Plan    Principal Problem:   STEMI involving left circumflex coronary artery [facility]) Active Problems:   Coronary artery disease involving native coronary artery of native heart with unstable angina pectoris ([place])   Essential hypertension   Hyperlipidemia due to dietary fat intake  Principal Problem:   STEMI involving left circumflex coronary artery ([facility])   Coronary artery disease involving native coronary artery of native heart with unstable angina pectoris ([place])  DES PCI to circumflex. :  Now on aspirin plus Brilinta.  Was on low-dose statin, increased atorvastatin 40, will change to rosuvastatin (was taking at home) and continue Zetia.  Low-dose beta-blocker started -we will titrate up to 25 mg twice daily    Essential hypertension -> currently only on low-dose metoprolol.  Will add low-dose ARB today.   Hyperlipidemia due to dietary fat intake -lipid panel shows current LDL of 81 (we need to recheck  as an outpatient)  Changed to rosuvastatin 40 mg plus Zetia  Continue Sinemet for restless leg syndrome.   Okay to transfer to telemetry later on today.  If echocardiogram stable, would anticipate fast-track discharge on 10/10/2018.   For questions or updates, please contact [place] Please consult [email or web address] for contact info under        Signed, Glenetta Hew, MD  10/09/2018, 8:53 AM

## 2018-10-09 NOTE — Progress Notes (Signed)
Patient continues to bend and use Right arm regardless of education and constant reinforcement. Patient had some bleeding and 2cc of air was given into the TR band. Will continue to monitor for any changes.

## 2018-10-09 NOTE — Care Management (Signed)
3.   S/W  DONAVANE @ [facility]  RX # [phone]   TICAGRELOR : NONE FORMULARY  BRILINTA   90 MG BID COVER- YES CO-PAY- $ 40.00 TIER- NO PRIOR  APPROVAL- NO  PREFERRED PHARMACY : YES [facility] M/O 90 DAYS SUPPLY FOR RETAIL OR M/O $ 80.00\

## 2018-10-09 NOTE — Plan of Care (Signed)
  Problem: Clinical Measurements: Goal: Ability to maintain clinical measurements within normal limits will improve Outcome: Progressing Goal: Will remain free from infection Outcome: Progressing Goal: Respiratory complications will improve Outcome: Progressing Goal: Cardiovascular complication will be avoided Outcome: Progressing   Problem: Activity: Goal: Risk for activity intolerance will decrease Outcome: Progressing   Problem: Nutrition: Goal: Adequate nutrition will be maintained Outcome: Progressing   Problem: Coping: Goal: Level of anxiety will decrease Outcome: Progressing   Problem: Elimination: Goal: Will not experience complications related to urinary retention Outcome: Progressing   Problem: Pain Managment: Goal: General experience of comfort will improve Outcome: Progressing   Problem: Safety: Goal: Ability to remain free from injury will improve Outcome: Progressing   Problem: Skin Integrity: Goal: Risk for impaired skin integrity will decrease Outcome: Progressing   Problem: Education: Goal: Understanding of medication regimen will improve Outcome: Progressing   Problem: Cardiac: Goal: Ability to achieve and maintain adequate cardiopulmonary perfusion will improve Outcome: Progressing Goal: Vascular access site(s) Level 0-1 will be maintained Outcome: Progressing   Problem: Health Behavior/Discharge Planning: Goal: Ability to safely manage health-related needs after discharge will improve Outcome: Progressing

## 2018-10-10 ENCOUNTER — Encounter (HOSPITAL_COMMUNITY): Payer: Self-pay | Admitting: Nurse Practitioner

## 2018-10-10 MED ORDER — TICAGRELOR 90 MG PO TABS: 90.0000 mg | ORAL_TABLET | Freq: Two times a day (BID) | ORAL | 6 refills | Status: DC

## 2018-10-10 MED ORDER — TICAGRELOR 90 MG PO TABS: 90.0000 mg | ORAL_TABLET | Freq: Two times a day (BID) | ORAL | 0 refills | Status: DC

## 2018-10-10 MED ORDER — METOPROLOL TARTRATE 25 MG PO TABS: 25.0000 mg | ORAL_TABLET | Freq: Two times a day (BID) | ORAL | 6 refills | Status: DC

## 2018-10-10 MED ORDER — ROSUVASTATIN CALCIUM 40 MG PO TABS: ORAL_TABLET | ORAL | 6 refills | Status: DC

## 2018-10-10 MED ORDER — NITROGLYCERIN 0.4 MG SL SUBL: 0.4000 mg | SUBLINGUAL_TABLET | SUBLINGUAL | 3 refills | Status: DC | PRN

## 2018-10-10 NOTE — Progress Notes (Signed)
CARDIAC REHAB PHASE I   PRE:  Rate/Rhythm: 80 SR   BP:  Sitting: 119/70       SaO2: 95% RA  MODE:  Ambulation: 940 ft   POST:  Rate/Rhythm: 92 SR  BP:  Sitting: 128/82       SaO2: 98% RA  Pt ambulated 975ft with steady gait on RA. Pt denied any complaints of CP, SOB or dizziness. Pt asked about EF and echo results. Reviewed ejection fraction with pt. Pt returned to bed per request. Call bell within reach.   [phone]  Andrew Lair MS, ACSM CEP  10:00 AM 10/10/2018

## 2018-10-10 NOTE — Discharge Summary (Addendum)
Discharge Summary    Patient ID: Andrew Mitchell MRN: [id number]; DOB: 07/09/1961  Admit date: 10/08/2018 Discharge date: 10/10/2018  Primary Care Provider: [facility]  Primary Cardiologist: Sinclair Grooms, MD   Discharge Diagnoses    Principal Problem:   STEMI involving left circumflex coronary artery [facility])  **Status post PCI and drug-eluting stent placement to the circumflex this admission. Active Problems:   Coronary artery disease involving native coronary artery of native heart with unstable angina pectoris ([place])   Essential hypertension   Hyperlipidemia due to dietary fat intake   Restless leg syndrome   Allergies Allergies  Allergen Reactions  . Shellfish Allergy Anaphylaxis    Diagnostic Studies/Procedures    Cardiac Catheterization and Percutaneous Coronary Intervention 12.26.2019  Left Anterior Descending  There is mild diffuse disease throughout the vessel.  First Diagonal Branch  There is mild disease in the vessel.  Second Diagonal Branch  There is mild disease in the vessel.  Left Circumflex  Ost Cx to Prox Cx lesion 100% stenosed  Ost Cx to Prox Cx lesion is 100% stenosed. The lesion is ulcerative and heavily thrombotic.      **The left circumflex was successfully stented using a 2.75 x 30 mm Orsiro DES**  Right Coronary Artery  There is mild diffuse disease throughout the vessel.  Dist RCA lesion 40% stenosed  Dist RCA lesion is 40% stenosed.  Right Posterior Atrioventricular Branch  Post Atrio lesion 90% stenosed  Post Atrio lesion is 90% stenosed.  _____________   2D Echocardiogram 12.27.2019  Study Conclusions   - Left ventricle: The cavity size was normal. Systolic function was   normal. The estimated ejection fraction was in the range of 50%   to 55%. Probable hypokinesis of the inferolateral and   anterolateral walls. Suboptimal image quality despite the use of   Definity. Doppler parameters are consistent  with abnormal left   ventricular relaxation (grade 1 diastolic dysfunction). - Aortic valve: Transvalvular velocity was within the normal range.   There was no stenosis. There was no regurgitation. - Aorta: Aortic root dimension: 42 mm (ED). Mid-ascending aortic   diameter: 39 mm (ED). - Aortic root: The aortic root was mildly dilated. - Ascending aorta: The ascending aorta was mildly dilated. - Mitral valve: Transvalvular velocity was within the normal range.   There was no evidence for stenosis. There was no regurgitation. - Left atrium: The atrium was normal in size. - Right ventricle: The cavity size was normal. Wall thickness was   normal. Systolic function was normal. - Right atrium: The atrium was normal in size. - Tricuspid valve: There was no regurgitation. - Inferior vena cava: The vessel was normal in size. The   respirophasic diameter changes were in the normal range (>= 50%),   consistent with normal central venous pressure. - Pericardium, extracardiac: There was no pericardial effusion. _____________   History of Present Illness     57 year old male with a history of restless leg syndrome, hyperlipidemia, and hypertension, who presented to the [facility] emergency department on December 26 secondary to a 2-day history of exertional chest discomfort.  He saw his primary care provider on the morning of December 26 and ECG was nonacute.  He was pain-free at that time.  However, on the evening of December 26, he developed substernal chest pressure prompting presentation.  In the emergency department, he was found to have subtle ST segment elevations in inferior leads and a code STEMI was activated.  Initial troponin was 3.39.  Hospital Course     Consultants: None  Patient was taken emergently to the cardiac catheterization laboratory, where he underwent diagnostic cardiac catheterization revealing a total occlusion of the ostial and proximal left circumflex.  The left  circumflex was successfully treated with a 2.75 x 30 mm Orsiro drug-eluting stent.  Patient had residual moderate distal RCA and more severe RPAV disease.  Post procedure, he was monitored in the coronary intensive care where he eventually peaked his troponin at 30.58.  He was placed on aspirin, beta-blocker, Brilinta, and high potency statin therapy.  He remained hemodynamically stable.  Echocardiogram was performed and showed low-normal LV function with grade 1 diastolic dysfunction.  He was transferred onto the floor on December 27 and has since been ambulating without recurrent symptoms or limitations.  He will be discharged home today in good condition with a plan for follow-up in the office in 7 to 10 days. _____________  Discharge Vitals Blood pressure 133/82, pulse 77, temperature 97.9 F (36.6 C), temperature source Oral, resp. rate 14, height 5\' 10"  (1.778 m), weight 82.6 kg, SpO2 97 %.  Filed Weights   10/08/18 2139  Weight: 82.6 kg    Labs & Radiologic Studies    CBC Recent Labs    10/09/18 0028 10/09/18 0557  WBC 9.0 10.2  HGB 15.1 15.3  HCT 43.1 43.6  MCV 93.7 94.4  PLT 243 086   Basic Metabolic Panel Recent Labs    10/08/18 2145 10/08/18 2254 10/09/18 0028 10/09/18 0557  NA 139 138  --  137  K 4.2 3.2*  --  3.7  CL 102 103  --  102  CO2 29  --   --  21*  GLUCOSE 114* 131*  --  108*  BUN 18 19  --  16  CREATININE 1.36* 1.00 1.08 0.96  CALCIUM 10.0  --   --  8.8*   Liver Function Tests Recent Labs    10/08/18 2145  AST 74*  ALT 18  ALKPHOS 66  BILITOT 1.2  PROT 7.4  ALBUMIN 4.3   Cardiac Enzymes Recent Labs    10/09/18 0028 10/09/18 0557 10/09/18 1157  TROPONINI 13.18* 30.58* 16.13*   Fasting Lipid Panel Recent Labs    10/09/18 0028  CHOL 126  HDL 38*  LDLCALC 81  TRIG 36  CHOLHDL 3.3   _____________  Dg Chest Portable 1 View  Result Date: 10/08/2018 CLINICAL DATA:  ST-elevation myocardial infarction. EXAM: PORTABLE CHEST 1 VIEW  COMPARISON:  Chest radiograph performed 04/04/2015 FINDINGS: The lungs are well-aerated and clear. There is no evidence of focal opacification, pleural effusion or pneumothorax. The cardiomediastinal silhouette is within normal limits. No acute osseous abnormalities are seen. IMPRESSION: No acute cardiopulmonary process seen. Electronically Signed   By: Garald Balding M.D.   On: 10/08/2018 22:17   Disposition   Pt is being discharged home today in good condition.  Follow-up Plans & Appointments    Follow-up Information    Belva Crome, MD Follow up on 10/23/2018.   Specialty:  Cardiology Why:  9 AM Contact information: [id number] [address] [phone]          Discharge Instructions    Amb Referral to Cardiac Rehabilitation   Complete by:  As directed    Diagnosis:   STEMI Coronary Stents        Discharge Medications   Allergies as of 10/10/2018      Reactions  Shellfish Allergy Anaphylaxis      Medication List    STOP taking these medications   diclofenac 75 MG EC tablet Commonly known as:  VOLTAREN   ibuprofen 200 MG tablet Commonly known as:  ADVIL,MOTRIN   sildenafil 20 MG tablet Commonly known as:  REVATIO     TAKE these medications   aspirin 81 MG tablet Take 81 mg by mouth daily.   carbidopa-levodopa 25-100 MG tablet Commonly known as:  SINEMET IR Take 1/2 to 1 tablet by mouth daily before bedtime.   CENTRUM PO Take 1 tablet by mouth daily.   ezetimibe 10 MG tablet Commonly known as:  ZETIA Take 1 tablet (10 mg total) by mouth daily. Please make overdue appt with Dr. Tamala Julian before anymore refills. 2nd attempt   Krill Oil 1000 MG Caps Take 1,000 mg by mouth daily.   lisdexamfetamine 60 MG capsule Commonly known as:  VYVANSE Take 40 mg by mouth every morning.   metoprolol tartrate 25 MG tablet Commonly known as:  LOPRESSOR Take 1 tablet (25 mg total) by mouth 2 (two) times daily.   nitroGLYCERIN 0.4  MG SL tablet Commonly known as:  NITROSTAT Place 1 tablet (0.4 mg total) under the tongue every 5 (five) minutes x 3 doses as needed for chest pain.   rosuvastatin 40 MG tablet Commonly known as:  CRESTOR Take 1 tablet twice weekly and increase as tolerated. Please make overdue appt with Dr. Tamala Julian before anymore refills. 2nd attempt What changed:  medication strength   saw palmetto 160 MG capsule Take 320 mg by mouth daily.   ticagrelor 90 MG Tabs tablet Commonly known as:  BRILINTA Take 1 tablet (90 mg total) by mouth 2 (two) times daily.   vitamin B-12 1000 MCG tablet Commonly known as:  CYANOCOBALAMIN Take 3,000 mcg by mouth daily.   vitamin C 500 MG tablet Commonly known as:  ASCORBIC ACID Take 500 mg by mouth daily.        Acute coronary syndrome (MI, NSTEMI, STEMI, etc) this admission?: Yes.     AHA/ACC Clinical Performance & Quality Measures: 1. Aspirin prescribed? - Yes 2. ADP Receptor Inhibitor (Plavix/Clopidogrel, Brilinta/Ticagrelor or Effient/Prasugrel) prescribed (includes medically managed patients)? - Yes 3. Beta Blocker prescribed? - Yes 4. High Intensity Statin (Lipitor 40-80mg  or Crestor 20-40mg ) prescribed? - Yes 5. EF assessed during THIS hospitalization? - Yes 6. For EF <40%, was ACEI/ARB prescribed? - Not Applicable (EF >/= 45%) 7. For EF <40%, Aldosterone Antagonist (Spironolactone or Eplerenone) prescribed? - Not Applicable (EF >/= 62%) 8. Cardiac Rehab Phase II ordered (Included Medically managed Patients)? - Yes     Outstanding Labs/Studies   F/u lipids / lft's in 6 wks.  Duration of Discharge Encounter   Greater than 30 minutes including physician time.  Signed, Murray Hodgkins, NP 10/10/2018, 12:34 PM

## 2018-10-10 NOTE — Progress Notes (Signed)
Progress Note  Patient Name: Andrew Mitchell Date of Encounter: 10/10/2018  Primary Cardiologist: Sinclair Grooms, MD   Subjective   Breathing is OK   No CP    Inpatient Medications    Scheduled Meds: . aspirin  81 mg Oral Daily  . carbidopa-levodopa  0.5 tablet Oral QHS  . ezetimibe  10 mg Oral Daily  . heparin  5,000 Units Subcutaneous Q8H  . lisdexamfetamine  60 mg Oral BH-q7a  . metoprolol tartrate  25 mg Oral BID  . multivitamin with minerals  1 tablet Oral Daily  . rosuvastatin  40 mg Oral q1800  . sodium chloride flush  3 mL Intravenous Q12H  . ticagrelor  90 mg Oral BID   Continuous Infusions: . sodium chloride 20 mL/hr (10/08/18 2208)  . sodium chloride     PRN Meds: sodium chloride, acetaminophen, morphine injection, nitroGLYCERIN, ondansetron (ZOFRAN) IV, oxyCODONE, sodium chloride flush   Vital Signs    Vitals:   10/09/18 1700 10/09/18 1759 10/09/18 2044 10/10/18 0510  BP: (!) 124/101 (!) 144/98 (!) 141/83 133/82  Pulse: 73 82 81 77  Resp: 17 (!) 24 18 14   Temp:  98.6 F (37 C) 98.1 F (36.7 C) 97.9 F (36.6 C)  TempSrc:  Oral Oral Oral  SpO2: 98% 97% 98% 97%  Weight:      Height:        Intake/Output Summary (Last 24 hours) at 10/10/2018 1152 Last data filed at 10/10/2018 0800 Gross per 24 hour  Intake 580 ml  Output 1710 ml  Net -1130 ml   Filed Weights   10/08/18 2139  Weight: 82.6 kg    Telemetry    SR  - Personally Reviewed  ECG      Physical Exam   GEN: No acute distress.   Neck: No JVD Cardiac: RRR, no murmurs, rubs, or gallops.  Respiratory: Clear to auscultation bilaterally. GI: Soft, nontender, non-distended  MS: No edema; No deformity. Neuro:  Nonfocal  Psych: Normal affect   Labs    Chemistry Recent Labs  Lab 10/08/18 2145 10/08/18 2254 10/09/18 0028 10/09/18 0557  NA 139 138  --  137  K 4.2 3.2*  --  3.7  CL 102 103  --  102  CO2 29  --   --  21*  GLUCOSE 114* 131*  --  108*  BUN 18 19  --  16    CREATININE 1.36* 1.00 1.08 0.96  CALCIUM 10.0  --   --  8.8*  PROT 7.4  --   --   --   ALBUMIN 4.3  --   --   --   AST 74*  --   --   --   ALT 18  --   --   --   ALKPHOS 66  --   --   --   BILITOT 1.2  --   --   --   GFRNONAA 57*  --  >60 >60  GFRAA >60  --  >60 >60  ANIONGAP 8  --   --  14     Hematology Recent Labs  Lab 10/08/18 2144 10/08/18 2254 10/09/18 0028 10/09/18 0557  WBC 11.3*  --  9.0 10.2  RBC 5.24  --  4.60 4.62  HGB 16.8 13.9 15.1 15.3  HCT 49.0 41.0 43.1 43.6  MCV 93.5  --  93.7 94.4  MCH 32.1  --  32.8 33.1  MCHC 34.3  --  35.0  35.1  RDW 12.6  --  12.8 12.9  PLT 262  --  243 236    Cardiac Enzymes Recent Labs  Lab 10/08/18 2145 10/09/18 0028 10/09/18 0557 10/09/18 1157  TROPONINI 3.53* 13.18* 30.58* 16.13*    Recent Labs  Lab 10/08/18 2148  TROPIPOC 3.39*     BNPNo results for input(s): BNP, PROBNP in the last 168 hours.   DDimer No results for input(s): DDIMER in the last 168 hours.   Radiology    Dg Chest Portable 1 View  Result Date: 10/08/2018 CLINICAL DATA:  ST-elevation myocardial infarction. EXAM: PORTABLE CHEST 1 VIEW COMPARISON:  Chest radiograph performed 04/04/2015 FINDINGS: The lungs are well-aerated and clear. There is no evidence of focal opacification, pleural effusion or pneumothorax. The cardiomediastinal silhouette is within normal limits. No acute osseous abnormalities are seen. IMPRESSION: No acute cardiopulmonary process seen. Electronically Signed   By: Garald Balding M.D.   On: 10/08/2018 22:17    Cardiac Studies   Echo   12/27 ------------------------------------------------------------------- Study Conclusions  - Left ventricle: The cavity size was normal. Systolic function was   normal. The estimated ejection fraction was in the range of 50%   to 55%. Probable hypokinesis of the inferolateral and   anterolateral walls. Suboptimal image quality despite the use of   Definity. Doppler parameters are  consistent with abnormal left   ventricular relaxation (grade 1 diastolic dysfunction). - Aortic valve: Transvalvular velocity was within the normal range.   There was no stenosis. There was no regurgitation. - Aorta: Aortic root dimension: 42 mm (ED). Mid-ascending aortic   diameter: 39 mm (ED). - Aortic root: The aortic root was mildly dilated. - Ascending aorta: The ascending aorta was mildly dilated. - Mitral valve: Transvalvular velocity was within the normal range.   There was no evidence for stenosis. There was no regurgitation. - Left atrium: The atrium was normal in size. - Right ventricle: The cavity size was normal. Wall thickness was   normal. Systolic function was normal. - Right atrium: The atrium was normal in size. - Tricuspid valve: There was no regurgitation. - Inferior vena cava: The vessel was normal in size. The   respirophasic diameter changes were in the normal range (>= 50%),   consistent with normal central venous pressure. - Pericardium, extracardiac: There was no pericardial effusion.    Patient Profile     57 y.o. male PMH of hypertension and hyperlipidemia (on low-dose Crestor plus Zetia) along with restless leg syndrome who is been having intermittent chest pain since Christmas Eve (05/06/2018).  Initially noted exertional chest pain; however 7 PM on the 26th he started noticing worsening exertional pain that then became nonexertional.  At that time he decided to come to the ER.  Upon arrival he was noted to have inferior ST elevations on EKG and inferior STEMI was called.  He already had a troponin level 3.39 at the time of initial arrival.  Taken emergently to cardiac catheterization lab by Dr. Burt Knack where he had PCI to major circumflex-OM branch.   Assessment & Plan    1  STEMI   S/p PTCA/DES to LCx  On ASA and Brilinta Doing good   Reviewed anatomy withy pt     Small distal PLA 90%   Medical Rx   2   HTN  Follow BP as outpt    3  HL   Crestor 40  and Zetia   F/U as outpt    Pt has appt with H  Tamala Julian   He made prior to event   Keep    For questions or updates, please contact [facility] Please consult [email or web address] for contact info under        Signed, Dorris Carnes, MD  10/10/2018, 11:52 AM

## 2018-10-10 NOTE — Discharge Instructions (Signed)
**  PLEASE REMEMBER TO BRING ALL OF YOUR MEDICATIONS TO EACH OF YOUR FOLLOW-UP OFFICE VISITS. ° °NO HEAVY LIFTING X 4 WEEKS. °NO SEXUAL ACTIVITY X 4 WEEKS. °NO DRIVING X 2 WEEKS. °NO SOAKING BATHS, HOT TUBS, POOLS, ETC., X 7 DAYS. ° °Radial Site Care °Refer to this sheet in the next few weeks. These instructions provide you with information on caring for yourself after your procedure. Your caregiver may also give you more specific instructions. Your treatment has been planned according to current medical practices, but problems sometimes occur. Call your caregiver if you have any problems or questions after your procedure. °HOME CARE INSTRUCTIONS °· You may shower the day after the procedure. Remove the bandage (dressing) and gently wash the site with plain soap and water. Gently pat the site dry.  °· Do not apply powder or lotion to the site.  °· Do not submerge the affected site in water for 3 to 5 days.  °· Inspect the site at least twice daily.  °· Do not flex or bend the affected arm for 24 hours.  °· No lifting over 5 pounds (2.3 kg) for 5 days after your procedure.  °· Do not drive home if you are discharged the same day of the procedure. Have someone else drive you.  ° °What to expect: °· Any bruising will usually fade within 1 to 2 weeks.  °· Blood that collects in the tissue (hematoma) may be painful to the touch. It should usually decrease in size and tenderness within 1 to 2 weeks.  °SEEK IMMEDIATE MEDICAL CARE IF: °· You have unusual pain at the radial site.  °· You have redness, warmth, swelling, or pain at the radial site.  °· You have drainage (other than a small amount of blood on the dressing).  °· You have chills.  °· You have a fever or persistent symptoms for more than 72 hours.  °· You have a fever and your symptoms suddenly get worse.  °· Your arm becomes pale, cool, tingly, or numb.  °· You have heavy bleeding from the site. Hold pressure on the site.  °_____________ ° °  ° °10 Habits of Highly  Healthy People ° °[facility] wants to help you get well and stay well.  Live a longer, healthier life by practicing healthy habits every day. ° °1.  Visit your primary care provider regularly. °2.  Make time for family and friends.  Healthy relationships are important. °3.  Take medications as directed by your provider. °4.  Maintain a healthy weight and a trim waistline. °5.  Eat healthy meals and snacks, rich in fruits, vegetables, whole grains, and lean proteins. °6.  Get moving every day - aim for 150 minutes of moderate physical activity each week. °7.  Don't smoke. °8.  Avoid alcohol or drink in moderation. °9.  Manage stress through meditation or mindful relaxation. °10.  Get seven to nine hours of quality sleep each night. ° °Want more information on healthy habits?  To learn more about these and other healthy habits, visit [email or web address]. °_____________ °  °  °

## 2018-10-10 NOTE — Care Management (Signed)
Pt notified of copay cost and given Brilinta copay card.  Pt verbalizes understanding.

## 2018-10-11 ENCOUNTER — Telehealth: Payer: Self-pay | Admitting: Physician Assistant

## 2018-10-11 NOTE — Telephone Encounter (Signed)
Patient called because he was confused about the medication instructions.  He was discharged 12/28 after a STEMI.  The rosuvastatin prescription says to take 1 tablet twice a week and see Dr. Tamala Julian for refills.  The strength of the medication was changed, but the additional instructions were not deleted.  I explained that he should take the medication 1 tablet daily, 40 mg.  I explained that his bad cholesterol needed to be lower, and it would also help him recover from his heart attack.  He is agreeable to trying this.  He will let us know if he tolerates it when he comes for his follow-up appointment and call in the meantime if he has any issues or concerns.  Rosaria Ferries, PA-C 10/11/2018 2:08 PM [facility] [phone]

## 2018-10-12 ENCOUNTER — Telehealth: Payer: Self-pay | Admitting: Interventional Cardiology

## 2018-10-12 NOTE — Telephone Encounter (Signed)
Spoke with Andrew Mitchell and he states since being discharged from the hospital after STEMI he is getting very winded when going up or down the stairs.  Takes 1-2 minutes to recover and then he is fine.  Has not checked BP since returning home.  Denies CP, lightheadedness, dizziness, swelling or HA.  Does have some blurred vision off and on.  Also having SOB when lying down.  Andrew Mitchell currently scheduled to see Dr. Tamala Julian 10/23/17.  Offered appt this Friday but Andrew Mitchell has a funeral to attend.  Advised Andrew Mitchell that I would send message to Dr. Tamala Julian to see if he had any recommendations, otherwise just plan to keep appt on the 10th.  Andrew Mitchell appreciative for call.

## 2018-10-12 NOTE — Telephone Encounter (Signed)
New Message         Pt c/o medication issue:  1. Name of Medication: Metoprolol  2. How are you currently taking this medication (dosage and times per day)? 2 x a day  3. Are you having a reaction (difficulty breathing--STAT)? SOB,  4. What is your medication issue? Reaction to meds, odd breathing pattern

## 2018-10-13 NOTE — Telephone Encounter (Signed)
Spoke with pt and he said the SOB is consistent all day.  Mostly noticeable when he is going up and down stairs.  States it isn't any worse or better after taking the Brilinta.  When he lays down at night he said he doesn't really feel SOB, more like a feeling that he can't catch his breath or get in a good deep breath (also described it like an abnormal breathing pattern).  Pt still denies swelling.  Advised I would update Dr. Tamala Julian and call back with recommendations.

## 2018-10-13 NOTE — Telephone Encounter (Signed)
The shortness of breath is likely Brilinta related. Let him know this is a medication side effect.  If he can put up with it until his prescription runs out we can switch to another medication (Plavix).  If he cannot wait/feels too poorly, can switch to Plavix by giving 300 mg along with the next Brilinta tablet then taking 75 mg/day of Plavix thereafter.

## 2018-10-13 NOTE — Telephone Encounter (Signed)
Spoke with pt and he would like to stay on Brilinta for now because he said he has heard such good things about it.  Advised pt to keep appt 1/10 and we can discuss further at that time in case he changes his mind.  Pt verbalized understanding and was appreciative for call.

## 2018-10-13 NOTE — Telephone Encounter (Signed)
Shortness of breath is either related to Brilinta or diastolic heart failure.  See if he notices more shortness of breath after taking Brilinta.  May need to start a diuretic but I need some information about blood pressure before being able to safely start therapy.  Is he able to measure his blood pressure?  Is he having any lower extremity swelling?  Is in need of the shortness of breath orthopnea?

## 2018-10-22 NOTE — Progress Notes (Signed)
Cardiology Office Note:    Date:  10/23/2018   ID:  Andrew Mitchell 03/05/1961, MRN [id number]  PCP:  Andrew Mitchell  Cardiologist:  Andrew Grooms, MD   Referring MD: [facility]   Chief Complaint  Patient presents with  . Chest Pain    Recent lateral wall infarct    History of Present Illness:    Andrew Mitchell is a 58 y.o. male with a hx of  DOE, family h/o CAD (father with MI) , hyperlipidemia,  inferior MI 2019 treated with RCA DES.  Andrew Mitchell is doing well.  He suffered a lateral wall acute myocardial infarction on 10/08/2018 requiring mechanical reperfusion of totally occluded proximal circumflex.  He has diffuse distal disease in the right coronary LAD and circumflex territories.  Vessels are diffusely diseased and have the appearance of "diabetic vessels".  Since discharge she has had some dyspnea that can occur spontaneously which is likely related to Brilinta.  Exertional tolerance is improved.  He has been doing too much exercise since discharge.  He refused phase 2 cardiac rehab but we rediscussed and he is now going to participate.  No bleeding on the current medical regimen.  Past Medical History:  Diagnosis Date  . Back pain   . CAD (coronary artery disease)    a. 09/2018 Inf STEMI/PCI: LM nl, LAD min irregs, D1/2 min irregs, LCX 100ost/p thrombotic (2.75x35 Orsiro DES), RCA 40d, RPAV 90.  . Carpal tunnel syndrome   . DDD (degenerative disc disease)   . Diastolic dysfunction    a. 09/2018 Echo: EF 50-55%, prob inflat and antlat HK, Gr1 DD. Mildlly dil Ao root (12m). Nl RV fxn.  . Hyperlipemia   . Mildly Dilated aortic root ([place])    a. 09/2018 Echo: 66mm.  . Tubular adenocarcinoma ([place]) 2010   colon    Past Surgical History:  Procedure Laterality Date  . CARPAL TUNNEL RELEASE Left 01/06/2013   Procedure: CARPAL TUNNEL RELEASE;  Surgeon: Wynonia Sours, MD;  Location: [facility];  Service: Orthopedics;   Laterality: Left;  ANESTHESIA: IV REGIONAL FAB  . COLONOSCOPY    . CORONARY ANGIOGRAPHY N/A 10/08/2018   Procedure: CORONARY ANGIOGRAPHY;  Surgeon: Sherren Mocha, MD;  Location: [facility];  Service: Cardiovascular;  Laterality: N/A;  . CORONARY STENT INTERVENTION N/A 10/08/2018   Procedure: CORONARY STENT INTERVENTION;  Surgeon: Sherren Mocha, MD;  Location: [facility];  Service: Cardiovascular;  Laterality: N/A;  . CORONARY/GRAFT ACUTE MI REVASCULARIZATION N/A 10/08/2018   Procedure: Coronary/Graft Acute MI Revascularization;  Surgeon: Sherren Mocha, MD;  Location: [facility];  Service: Cardiovascular;  Laterality: N/A;  . KNEE ARTHROSCOPY     rightx2  . RIB RESECTION  2003   thorasic outlet syndrome-rt  . SHOULDER ARTHROSCOPY     rightx2  . TONSILLECTOMY      Current Medications: Current Meds  Medication Sig  . aspirin 81 MG tablet Take 81 mg by mouth daily.  . carbidopa-levodopa (SINEMET IR) 25-100 MG per tablet Take 1/2 to 1 tablet by mouth daily before bedtime.  Marland Kitchen ezetimibe (ZETIA) 10 MG tablet Take 1 tablet (10 mg total) by mouth daily. Please make overdue appt with Dr. Tamala Julian before anymore refills. 2nd attempt  . Krill Oil 1000 MG CAPS Take 1,000 mg by mouth daily.  Marland Kitchen lisdexamfetamine (VYVANSE) 40 MG capsule Take 40 mg by mouth every morning.  . metoprolol tartrate (LOPRESSOR) 25  MG tablet Take 1 tablet (25 mg total) by mouth 2 (two) times daily.  . Multiple Vitamins-Minerals (CENTRUM PO) Take 1 tablet by mouth daily.  . nitroGLYCERIN (NITROSTAT) 0.4 MG SL tablet Place 1 tablet (0.4 mg total) under the tongue every 5 (five) minutes x 3 doses as needed for chest pain.  . rosuvastatin (CRESTOR) 40 MG tablet Take 1 tablet twice weekly and increase as tolerated. Please make overdue appt with Dr. Tamala Julian before anymore refills. 2nd attempt  . saw palmetto 160 MG capsule Take 320 mg by mouth daily.  . ticagrelor (BRILINTA) 90 MG TABS tablet Take 1 tablet  (90 mg total) by mouth 2 (two) times daily.  . vitamin B-12 (CYANOCOBALAMIN) 1000 MCG tablet Take 3,000 mcg by mouth daily.   . vitamin C (ASCORBIC ACID) 500 MG tablet Take 500 mg by mouth daily.     Allergies:   Shellfish allergy   Social History   Socioeconomic History  . Marital status: Married    Spouse name: Not on file  . Number of children: Not on file  . Years of education: Not on file  . Highest education level: Not on file  Occupational History  . Not on file  Social Needs  . Financial resource strain: Not on file  . Food insecurity:    Worry: Not on file    Inability: Not on file  . Transportation needs:    Medical: Not on file    Non-medical: Not on file  Tobacco Use  . Smoking status: Never Smoker  . Smokeless tobacco: Never Used  Substance and Sexual Activity  . Alcohol use: Yes    Comment: occ  . Drug use: No  . Sexual activity: Not on file  Lifestyle  . Physical activity:    Days per week: Not on file    Minutes per session: Not on file  . Stress: Not on file  Relationships  . Social connections:    Talks on phone: Not on file    Gets together: Not on file    Attends religious service: Not on file    Active member of club or organization: Not on file    Attends meetings of clubs or organizations: Not on file    Relationship status: Not on file  Other Topics Concern  . Not on file  Social History Narrative  . Not on file     Family History: The patient's family history is not on file.  ROS:   Please see the history of present illness.    Having some vision disturbance and wonders if it could be related to any of his medications.  All other systems reviewed and are negative.  EKGs/Labs/Other Studies Reviewed:    The following studies were reviewed today:  Coronary angiography and PCI 10/08/2018:  1.  Patent left main, RCA, and LAD with mild diffuse luminal irregularities 2.  Severe stenosis of the right posterior lateral branch at the  origin of the second PL 3.  Acute total occlusion of the proximal circumflex, treated successfully with primary PCI using a 2.75 x 35 mm Orsiro DES  Recommend:   ASA/Brilinta x 12 months without interruption  Echo for assessment of LV function  Post-MI medical therapy  **There is a non-system delay for difficulty passing guide catheters through the arm and difficulty wiring the circumflex secondary to severe angulation and tortuosity. Multiple guides and wires are used for the procedure**  Digital images were personally reviewed.  Digital images were  reviewed with the patient.  ECHOCARDIOGRAM 2019: Study Conclusions  - Left ventricle: The cavity size was normal. Systolic function was   normal. The estimated ejection fraction was in the range of 50%   to 55%. Probable hypokinesis of the inferolateral and   anterolateral walls. Suboptimal image quality despite the use of   Definity. Doppler parameters are consistent with abnormal left   ventricular relaxation (grade 1 diastolic dysfunction). - Aortic valve: Transvalvular velocity was within the normal range.   There was no stenosis. There was no regurgitation. - Aorta: Aortic root dimension: 42 mm (ED). Mid-ascending aortic   diameter: 39 mm (ED). - Aortic root: The aortic root was mildly dilated. - Ascending aorta: The ascending aorta was mildly dilated. - Mitral valve: Transvalvular velocity was within the normal range.   There was no evidence for stenosis. There was no regurgitation. - Left atrium: The atrium was normal in size. - Right ventricle: The cavity size was normal. Wall thickness was   normal. Systolic function was normal. - Right atrium: The atrium was normal in size. - Tricuspid valve: There was no regurgitation. - Inferior vena cava: The vessel was normal in size. The   respirophasic diameter changes were in the normal range (>= 50%),   consistent with normal central venous pressure. - Pericardium,  extracardiac: There was no pericardial effusion.  EKG:  EKG is performed today on October 23, 2018 and is normal.  Recent Labs: 10/08/2018: ALT 18 10/09/2018: BUN 16; Creatinine, Ser 0.96; Hemoglobin 15.3; Platelets 236; Potassium 3.7; Sodium 137  Recent Lipid Panel    Component Value Date/Time   CHOL 126 10/09/2018 0028   CHOL 122 02/06/2018 0725   TRIG 36 10/09/2018 0028   HDL 38 (L) 10/09/2018 0028   HDL 44 02/06/2018 0725   CHOLHDL 3.3 10/09/2018 0028   VLDL 7 10/09/2018 0028   LDLCALC 81 10/09/2018 0028   LDLCALC 62 02/06/2018 0725    Physical Exam:    VS:  BP 120/82   Pulse 74   Ht 5\' 10"  (1.778 m)   Wt 187 lb 12.8 oz (85.2 kg)   BMI 26.95 kg/m     Wt Readings from Last 3 Encounters:  10/23/18 187 lb 12.8 oz (85.2 kg)  10/08/18 182 lb (82.6 kg)  06/18/17 180 lb (81.6 kg)     GEN: Healthy-appearing. No acute distress HEENT: Normal NECK: No JVD. LYMPHATICS: No lymphadenopathy CARDIAC: RRR.  No murmur, gallop, edema VASCULAR: Pulses 2+ and symmetric in the radial and carotids bilaterally., Bruits absent in the carotids RESPIRATORY:  Clear to auscultation without rales, wheezing or rhonchi  ABDOMEN: Soft, non-tender, non-distended, No pulsatile mass, MUSCULOSKELETAL: No deformity  SKIN: Warm and dry NEUROLOGIC:  Alert and oriented x 3 PSYCHIATRIC:  Normal affect   ASSESSMENT:    1. Coronary artery disease involving native coronary artery of native heart with unstable angina pectoris ([place])   2. Hyperlipidemia, unspecified hyperlipidemia type   3. DOE (dyspnea on exertion)   4. Essential hypertension   5. STEMI involving left circumflex coronary artery (HCC)    PLAN:    In order of problems listed above:  1. We reviewed the recent coronary angiogram.  He has 3 territory distal vessel diffuse disease.  He needs aggressive secondary risk prevention.  This includes targeting lower than usual LDL below a target of 50.  We discussed secondary risk prevention in  detail as noted below. 2. LDL was 81 when he was admitted to the hospital.  He is now on high intensity statin therapy which is replacing low intensity Crestor 5 mg/day.  Lipid and liver panel will be done in late February.  We will also do a hemoglobin A1c to have better assess his risk. 3. For several years he has had dyspnea on exertion and this may represent an ischemic equivalent.  The distal territories do not have anatomy that is conducive to PCI.  The LAD for the most part is widely patent. 4. Blood pressure target less than 130/80 mmHg.  Low-salt diet, exercise, and weight control are stressed.  Overall education and awareness concerning primary/secondary risk prevention was discussed in detail: LDL less than 70, hemoglobin A1c less than 7, blood pressure target less than 130/80 mmHg, >150 minutes of moderate aerobic activity per week, avoidance of smoking, weight control (via diet and exercise), and continued surveillance/management of/for obstructive sleep apnea.  97-month clinical follow-up.  Greater than 50% of the time during this office visit was spent in education, counseling, and coordination of care related to underlying disease process and testing as outlined.    Medication Adjustments/Labs and Tests Ordered: Current medicines are reviewed at length with the patient today.  Concerns regarding medicines are outlined above.  Orders Placed This Encounter  Procedures  . HgB A1c  . Lipid panel  . Hepatic function panel  . AMB referral to cardiac rehabilitation   No orders of the defined types were placed in this encounter.   There are no Patient Instructions on file for this visit.   Signed, Andrew Grooms, MD  10/23/2018 9:41 AM    [place]

## 2018-10-23 ENCOUNTER — Ambulatory Visit: Payer: 59 | Admitting: Interventional Cardiology

## 2018-10-23 ENCOUNTER — Encounter: Payer: Self-pay | Admitting: *Deleted

## 2018-10-23 ENCOUNTER — Encounter: Payer: Self-pay | Admitting: Interventional Cardiology

## 2018-10-23 VITALS — BP 120/82 | HR 74 | Ht 70.0 in | Wt 187.8 lb

## 2018-10-23 DIAGNOSIS — I2121 ST elevation (STEMI) myocardial infarction involving left circumflex coronary artery: Secondary | ICD-10-CM

## 2018-10-23 DIAGNOSIS — E785 Hyperlipidemia, unspecified: Secondary | ICD-10-CM | POA: Diagnosis not present

## 2018-10-23 DIAGNOSIS — I10 Essential (primary) hypertension: Secondary | ICD-10-CM | POA: Diagnosis not present

## 2018-10-23 DIAGNOSIS — I25110 Atherosclerotic heart disease of native coronary artery with unstable angina pectoris: Secondary | ICD-10-CM | POA: Diagnosis not present

## 2018-10-23 DIAGNOSIS — R0609 Other forms of dyspnea: Secondary | ICD-10-CM

## 2018-10-23 NOTE — Patient Instructions (Signed)
Medication Instructions:  Your physician recommends that you continue on your current medications as directed. Please refer to the Current Medication list given to you today.  If you need a refill on your cardiac medications before your next appointment, please call your pharmacy.   Lab work: Your physician recommends that you return for lab work in: mid to late February. (A1C, Liver, Lipid).  Please come fasting for these labs (nothing to eat or drink after midnight except water and black coffee).  If you have labs (blood work) drawn today and your tests are completely normal, you will receive your results only by: Marland Kitchen MyChart Message (if you have MyChart) OR . A paper copy in the mail If you have any lab test that is abnormal or we need to change your treatment, we will call you to review the results.  Testing/Procedures: None  Follow-Up: At [facility], you and your health needs are our priority.  As part of our continuing mission to provide you with exceptional heart care, we have created designated Provider Care Teams.  These Care Teams include your primary Cardiologist (physician) and Advanced Practice Providers (APPs -  Physician Assistants and Nurse Practitioners) who all work together to provide you with the care you need, when you need it. You will need a follow up appointment in 2 months.  Please call our office 2 months in advance to schedule this appointment.  You may see Sinclair Grooms, MD or one of the following Advanced Practice Providers on your designated Care Team:   Truitt Merle, NP Cecilie Kicks, NP . Kathyrn Drown, NP  Any Other Special Instructions Will Be Listed Below (If Applicable).  You have been referred to Phase 2 Cardiac Rehab.   Please contact the office prior to your appointment if your shortness of breath worsens.

## 2018-10-23 NOTE — Addendum Note (Signed)
Addended by: Carylon Perches on: 10/23/2018 05:38 PM   Modules accepted: Orders

## 2018-11-02 ENCOUNTER — Other Ambulatory Visit: Payer: Self-pay

## 2018-11-02 ENCOUNTER — Other Ambulatory Visit: Payer: Self-pay | Admitting: Interventional Cardiology

## 2018-11-02 MED ORDER — ROSUVASTATIN CALCIUM 40 MG PO TABS: ORAL_TABLET | ORAL | 11 refills | Status: DC

## 2018-11-02 NOTE — Telephone Encounter (Signed)
Pt pharmacy request refill on Rx Rosuvastatin 40 mg. Please address. Thank you.

## 2018-11-02 NOTE — Telephone Encounter (Signed)
He was seen 10/23/18.  Ok to fill.

## 2018-11-05 ENCOUNTER — Telehealth (HOSPITAL_COMMUNITY): Payer: Self-pay

## 2018-11-05 NOTE — Telephone Encounter (Signed)
Pt insurance is active and benefits verified through [employer] $0.00, DED $1,000.00/$0.00 met, out of pocket $1,500.00/$65.00 met, co-insurance 0%. No pre-authorization required. Debbie/[employer], 11/05/2018 @ 332PM, REF# [id number]

## 2018-11-11 NOTE — Telephone Encounter (Signed)
Called patient to see if he was interested in participating in the Cardiac Rehab Program. Patient stated yes. Patient will come in for orientation on 12/10/2018 @ 730AM and will attend the 645AM exercise class. Went over insurance, patient verbalized understanding.  Mailed homework package.

## 2018-12-01 ENCOUNTER — Other Ambulatory Visit: Payer: 59

## 2018-12-01 DIAGNOSIS — E785 Hyperlipidemia, unspecified: Secondary | ICD-10-CM

## 2018-12-01 DIAGNOSIS — I2121 ST elevation (STEMI) myocardial infarction involving left circumflex coronary artery: Secondary | ICD-10-CM

## 2018-12-01 DIAGNOSIS — I25110 Atherosclerotic heart disease of native coronary artery with unstable angina pectoris: Secondary | ICD-10-CM

## 2018-12-02 LAB — LIPID PANEL
Chol/HDL Ratio: 1.8 ratio (ref 0.0–5.0)
Cholesterol, Total: 72 mg/dL — ABNORMAL LOW (ref 100–199)
HDL: 39 mg/dL — ABNORMAL LOW (ref >39)
LDL CALC: 23 mg/dL (ref 0–99)
Triglycerides: 50 mg/dL (ref 0–149)
VLDL Cholesterol Cal: 10 mg/dL (ref 5–40)

## 2018-12-02 LAB — HEMOGLOBIN A1C
Est. average glucose Bld gHb Est-mCnc: 111 mg/dL
Hgb A1c MFr Bld: 5.5 % (ref 4.8–5.6)

## 2018-12-02 LAB — HEPATIC FUNCTION PANEL
ALT: 111 IU/L — ABNORMAL HIGH (ref 0–44)
AST: 88 IU/L — ABNORMAL HIGH (ref 0–40)
Albumin: 4.1 g/dL (ref 3.8–4.9)
Alkaline Phosphatase: 62 IU/L (ref 39–117)
Bilirubin Total: 0.7 mg/dL (ref 0.0–1.2)
Bilirubin, Direct: 0.21 mg/dL (ref 0.00–0.40)
Total Protein: 6.3 g/dL (ref 6.0–8.5)

## 2018-12-03 NOTE — Telephone Encounter (Signed)
Spoke with the pt and advised him that his lab results have just become available and I reassured him that the results will be reviewed by Dr.Smith and we will call him as soon as we hear back from him with his recommendations. Pt verbalized understanding and agreed.

## 2018-12-04 ENCOUNTER — Telehealth (HOSPITAL_COMMUNITY): Payer: Self-pay | Admitting: Pharmacist

## 2018-12-04 NOTE — Telephone Encounter (Signed)
Left message for pt to call back.  Dr. Tamala Julian still needs to review.  ALT is elevated at 111.  Was going to ask pt if he has increased Crestor to more than twice weekly or if has had alcohol since they were last checked a month ago.

## 2018-12-04 NOTE — Telephone Encounter (Signed)
Spoke with pt. See result note.  °

## 2018-12-04 NOTE — Telephone Encounter (Signed)
Cardiac Rehab Medication Review by a Pharmacist  Does the patient  feel that his/her medications are working for him/her?  yes  Has the patient been experiencing any side effects to the medications prescribed?  yes  Does the patient measure his/her own blood pressure or blood glucose at home?  yes   Does the patient have any problems obtaining medications due to transportation or finances?   no  Understanding of regimen: good Understanding of indications: good Potential of compliance: good    Pharmacist comments: Patient is not taking crestor anymore due to elevated liver enzymes per MD visit / call.  Thank you for allowing pharmacy to be a part of this patient's care.  Tamela Gammon, PharmD 12/04/2018 5:33 PM PGY-1 Pharmacy Resident Direct Phone: [phone] Please check [email or web address] for unit-specific pharmacist phone numbers

## 2018-12-04 NOTE — Progress Notes (Signed)
KYIAN OBST 58 y.o. male DOB 10/22/1960 MRN [id number]       Nutrition Screen Note  No diagnosis found. Past Medical History:  Diagnosis Date  . Back pain   . CAD (coronary artery disease)    a. 09/2018 Inf STEMI/PCI: LM nl, LAD min irregs, D1/2 min irregs, LCX 100ost/p thrombotic (2.75x35 Orsiro DES), RCA 40d, RPAV 90.  . Carpal tunnel syndrome   . DDD (degenerative disc disease)   . Diastolic dysfunction    a. 09/2018 Echo: EF 50-55%, prob inflat and antlat HK, Gr1 DD. Mildlly dil Ao root (31m). Nl RV fxn.  . Hyperlipemia   . Mildly Dilated aortic root ([place])    a. 09/2018 Echo: 87mm.  . Tubular adenocarcinoma ([place]) 2010   colon   Meds reviewed.     Current Outpatient Medications (Cardiovascular):  .  ezetimibe (ZETIA) 10 MG tablet, Take 1 tablet (10 mg total) by mouth daily. Please make overdue appt with Dr. Tamala Julian before anymore refills. 2nd attempt .  metoprolol tartrate (LOPRESSOR) 25 MG tablet, Take 1 tablet (25 mg total) by mouth 2 (two) times daily. .  nitroGLYCERIN (NITROSTAT) 0.4 MG SL tablet, Place 1 tablet (0.4 mg total) under the tongue every 5 (five) minutes x 3 doses as needed for chest pain. .  rosuvastatin (CRESTOR) 40 MG tablet, Take 1 tablet twice weekly and increase as tolerated. Please make overdue appt with Dr. Tamala Julian before anymore refills. 2nd attempt   Current Outpatient Medications (Analgesics):  .  aspirin 81 MG tablet, Take 81 mg by mouth daily.  Current Outpatient Medications (Hematological):  .  ticagrelor (BRILINTA) 90 MG TABS tablet, Take 1 tablet (90 mg total) by mouth 2 (two) times daily. .  vitamin B-12 (CYANOCOBALAMIN) 1000 MCG tablet, Take 3,000 mcg by mouth daily.   Current Outpatient Medications (Other):  .  carbidopa-levodopa (SINEMET IR) 25-100 MG per tablet, Take 1/2 to 1 tablet by mouth daily before bedtime. Javier Docker Oil 1000 MG CAPS, Take 1,000 mg by mouth daily. Marland Kitchen  lisdexamfetamine (VYVANSE) 40 MG capsule, Take 40 mg by mouth  every morning. .  Multiple Vitamins-Minerals (CENTRUM PO), Take 1 tablet by mouth daily. .  saw palmetto 160 MG capsule, Take 320 mg by mouth daily. .  vitamin C (ASCORBIC ACID) 500 MG tablet, Take 500 mg by mouth daily.   HT: Ht Readings from Last 1 Encounters:  10/23/18 5\' 10"  (1.778 m)    WT: Wt Readings from Last 5 Encounters:  10/23/18 187 lb 12.8 oz (85.2 kg)  10/08/18 182 lb (82.6 kg)  06/18/17 180 lb (81.6 kg)  06/03/17 188 lb (85.3 kg)  02/21/17 180 lb (81.6 kg)     BMI = 26.95   Current tobacco use? No       Labs:  Lipid Panel     Component Value Date/Time   CHOL 72 (L) 12/01/2018 0742   TRIG 50 12/01/2018 0742   HDL 39 (L) 12/01/2018 0742   CHOLHDL 1.8 12/01/2018 0742   CHOLHDL 3.3 10/09/2018 0028   VLDL 7 10/09/2018 0028   LDLCALC 23 12/01/2018 0742    Lab Results  Component Value Date   HGBA1C 5.5 12/01/2018   CBG (last 3)  No results for input(s): GLUCAP in the last 72 hours.  Nutrition Diagnosis ? Food-and nutrition-related knowledge deficit related to lack of exposure to information as related to diagnosis of: ? CVD   Nutrition Goal(s):  ? To be determined  Plan:  Pt to  attend nutrition classes ? Nutrition I ? Nutrition II ? Portion Distortion  Will provide client-centered nutrition education as part of interdisciplinary care.   Monitor and evaluate progress toward nutrition goal with team.  Laurina Bustle, MS, RD, LDN 12/04/2018 2:43 PM

## 2018-12-10 ENCOUNTER — Encounter (HOSPITAL_COMMUNITY): Payer: Self-pay

## 2018-12-10 ENCOUNTER — Encounter (HOSPITAL_COMMUNITY)
Admission: RE | Admit: 2018-12-10 | Discharge: 2018-12-10 | Disposition: A | Discharge status: Home / Self Care | Payer: 59 | Source: Ambulatory Visit | Attending: Interventional Cardiology | Admitting: Interventional Cardiology

## 2018-12-10 VITALS — BP 118/70 | HR 65 | Ht 69.75 in | Wt 188.1 lb

## 2018-12-10 DIAGNOSIS — I214 Non-ST elevation (NSTEMI) myocardial infarction: Secondary | ICD-10-CM | POA: Insufficient documentation

## 2018-12-10 DIAGNOSIS — Z955 Presence of coronary angioplasty implant and graft: Secondary | ICD-10-CM | POA: Diagnosis present

## 2018-12-10 NOTE — Progress Notes (Signed)
Cardiac Individual Treatment Plan  Patient Details  Name: Andrew Mitchell MRN: [id number] Date of Birth: 04-May-1961 Referring Provider:     CARDIAC REHAB PHASE II ORIENTATION from 12/10/2018 in [place]  Referring Provider  Belva Crome MD       Initial Encounter Date:    CARDIAC REHAB PHASE II ORIENTATION from 12/10/2018 in [place]  Date  12/10/18      Visit Diagnosis: NSTEMI (non-ST elevated myocardial infarction) [facility])  Status post coronary artery stent placement  Patient's Home Medications on Admission:  Current Outpatient Medications:  .  aspirin 81 MG tablet, Take 81 mg by mouth daily., Disp: , Rfl:  .  carbidopa-levodopa (SINEMET IR) 25-100 MG per tablet, Take 1/2 to 1 tablet by mouth daily before bedtime., Disp: , Rfl:  .  ezetimibe (ZETIA) 10 MG tablet, Take 1 tablet (10 mg total) by mouth daily. Please make overdue appt with Dr. Tamala Julian before anymore refills. 2nd attempt, Disp: 90 tablet, Rfl: 0 .  Krill Oil 1000 MG CAPS, Take 1,000 mg by mouth daily., Disp: , Rfl:  .  lisdexamfetamine (VYVANSE) 40 MG capsule, Take 40 mg by mouth every morning., Disp: , Rfl:  .  metoprolol tartrate (LOPRESSOR) 25 MG tablet, Take 1 tablet (25 mg total) by mouth 2 (two) times daily., Disp: 60 tablet, Rfl: 6 .  Multiple Vitamins-Minerals (CENTRUM PO), Take 1 tablet by mouth daily., Disp: , Rfl:  .  nitroGLYCERIN (NITROSTAT) 0.4 MG SL tablet, Place 1 tablet (0.4 mg total) under the tongue every 5 (five) minutes x 3 doses as needed for chest pain., Disp: 25 tablet, Rfl: 3 .  rosuvastatin (CRESTOR) 40 MG tablet, Take 1 tablet twice weekly and increase as tolerated. Please make overdue appt with Dr. Tamala Julian before anymore refills. 2nd attempt (Patient not taking: Reported on 12/04/2018), Disp: 10 tablet, Rfl: 11 .  saw palmetto 160 MG capsule, Take 320 mg by mouth daily., Disp: , Rfl:  .  ticagrelor (BRILINTA) 90 MG TABS tablet, Take  1 tablet (90 mg total) by mouth 2 (two) times daily., Disp: 60 tablet, Rfl: 6 .  vitamin B-12 (CYANOCOBALAMIN) 1000 MCG tablet, Take 3,000 mcg by mouth daily. , Disp: , Rfl:  .  vitamin C (ASCORBIC ACID) 500 MG tablet, Take 500 mg by mouth daily., Disp: , Rfl:   Past Medical History: Past Medical History:  Diagnosis Date  . Back pain   . CAD (coronary artery disease)    a. 09/2018 Inf STEMI/PCI: LM nl, LAD min irregs, D1/2 min irregs, LCX 100ost/p thrombotic (2.75x35 Orsiro DES), RCA 40d, RPAV 90.  . Carpal tunnel syndrome   . DDD (degenerative disc disease)   . Diastolic dysfunction    a. 09/2018 Echo: EF 50-55%, prob inflat and antlat HK, Gr1 DD. Mildlly dil Ao root (28m). Nl RV fxn.  . Hyperlipemia   . Mildly Dilated aortic root ([place])    a. 09/2018 Echo: 53mm.  . Tubular adenocarcinoma ([place]) 2010   colon    Tobacco Use: Social History   Tobacco Use  Smoking Status Never Smoker  Smokeless Tobacco Never Used    Labs: Recent Review Flowsheet Data    Labs for ITP Cardiac and Pulmonary Rehab Latest Ref Rng & Units 10/01/2017 02/06/2018 10/08/2018 10/09/2018 12/01/2018   Cholestrol 100 - 199 mg/dL 187 122 155 126 72(L)   LDLCALC 0 - 99 mg/dL 112(H) 62 76 81 23   HDL >39 mg/dL  38(L) 44 43 38(L) 39(L)   Trlycerides 0 - 149 mg/dL 186(H) 79 182(H) 36 50   Hemoglobin A1c 4.8 - 5.6 % - - - - 5.5   TCO2 22 - 32 mmol/L - - 26 - -      Capillary Blood Glucose: No results found for: GLUCAP   Exercise Target Goals: Exercise Program Goal: Individual exercise prescription set using results from initial 6 min walk test and THRR while considering  patient's activity barriers and safety.   Exercise Prescription Goal: Initial exercise prescription builds to 30-45 minutes a day of aerobic activity, 2-3 days per week.  Home exercise guidelines will be given to patient during program as part of exercise prescription that the participant will acknowledge.  Activity Barriers & Risk  Stratification: Activity Barriers & Cardiac Risk Stratification - 12/10/18 1018      Activity Barriers & Cardiac Risk Stratification   Activity Barriers  None    Cardiac Risk Stratification  High       6 Minute Walk: 6 Minute Walk    Row Name 12/10/18 1016         6 Minute Walk   Phase  Initial     Distance  1946 feet     Walk Time  6 minutes     # of Rest Breaks  0     MPH  3.69     METS  4.53     RPE  11     Perceived Dyspnea   0     VO2 Peak  15.84     Symptoms  No     Resting HR  65 bpm     Resting BP  118/70     Resting Oxygen Saturation   98 %     Exercise Oxygen Saturation  during 6 min walk  99 %     Max Ex. HR  83 bpm     Max Ex. BP  122/78     2 Minute Post BP  124/72        Oxygen Initial Assessment:   Oxygen Re-Evaluation:   Oxygen Discharge (Final Oxygen Re-Evaluation):   Initial Exercise Prescription: Initial Exercise Prescription - 12/10/18 1000      Date of Initial Exercise RX and Referring Provider   Date  12/10/18    Referring Provider  Belva Crome MD     Expected Discharge Date  03/17/19      Treadmill   MPH  3    Grade  1    Minutes  10    METs  3.71      Bike   Level  1    Minutes  10    METs  3.22      NuStep   Level  2    SPM  85    Minutes  10    METs  3      Prescription Details   Frequency (times per week)  3x    Duration  Progress to 30 minutes of continuous aerobic without signs/symptoms of physical distress      Intensity   THRR 40-80% of Max Heartrate  65-130    Ratings of Perceived Exertion  11-13    Perceived Dyspnea  0-4      Progression   Progression  Continue progressive overload as per policy without signs/symptoms or physical distress.      Resistance Training   Training Prescription  Yes    Weight  5lbs  Reps  10-15       Perform Capillary Blood Glucose checks as needed.  Exercise Prescription Changes:   Exercise Comments:   Exercise Goals and Review: Exercise Goals    Row  Name 12/10/18 1018             Exercise Goals   Increase Physical Activity  Yes       Intervention  Provide advice, education, support and counseling about physical activity/exercise needs.;Develop an individualized exercise prescription for aerobic and resistive training based on initial evaluation findings, risk stratification, comorbidities and participant's personal goals.       Expected Outcomes  Short Term: Attend rehab on a regular basis to increase amount of physical activity.;Long Term: Add in home exercise to make exercise part of routine and to increase amount of physical activity.;Long Term: Exercising regularly at least 3-5 days a week.       Increase Strength and Stamina  Yes       Intervention  Provide advice, education, support and counseling about physical activity/exercise needs.;Develop an individualized exercise prescription for aerobic and resistive training based on initial evaluation findings, risk stratification, comorbidities and participant's personal goals.       Expected Outcomes  Short Term: Increase workloads from initial exercise prescription for resistance, speed, and METs.;Short Term: Perform resistance training exercises routinely during rehab and add in resistance training at home;Long Term: Improve cardiorespiratory fitness, muscular endurance and strength as measured by increased METs and functional capacity (6MWT)       Able to understand and use rate of perceived exertion (RPE) scale  Yes       Intervention  Provide education and explanation on how to use RPE scale       Expected Outcomes  Short Term: Able to use RPE daily in rehab to express subjective intensity level;Long Term:  Able to use RPE to guide intensity level when exercising independently       Knowledge and understanding of Target Heart Rate Range (THRR)  Yes       Intervention  Provide education and explanation of THRR including how the numbers were predicted and where they are located for  reference       Expected Outcomes  Short Term: Able to state/look up THRR;Short Term: Able to use daily as guideline for intensity in rehab;Long Term: Able to use THRR to govern intensity when exercising independently       Able to check pulse independently  Yes       Intervention  Provide education and demonstration on how to check pulse in carotid and radial arteries.;Review the importance of being able to check your own pulse for safety during independent exercise       Expected Outcomes  Short Term: Able to explain why pulse checking is important during independent exercise;Long Term: Able to check pulse independently and accurately       Understanding of Exercise Prescription  Yes       Intervention  Provide education, explanation, and written materials on patient's individual exercise prescription       Expected Outcomes  Short Term: Able to explain program exercise prescription;Long Term: Able to explain home exercise prescription to exercise independently          Exercise Goals Re-Evaluation :   Discharge Exercise Prescription (Final Exercise Prescription Changes):   Nutrition:  Target Goals: Understanding of nutrition guidelines, daily intake of sodium 1500mg , cholesterol 200mg , calories 30% from fat and 7% or less from saturated fats, daily  to have 5 or more servings of fruits and vegetables.  Biometrics: Pre Biometrics - 12/10/18 1017      Pre Biometrics   Height  5' 9.75" (1.772 m)    Weight  85.3 kg    Waist Circumference  37 inches    Hip Circumference  41 inches    Waist to Hip Ratio  0.9 %    BMI (Calculated)  27.17    Triceps Skinfold  20 mm    % Body Fat  26.6 %    Grip Strength  42 kg    Flexibility  13 in    Single Leg Stand  30 seconds        Nutrition Therapy Plan and Nutrition Goals:   Nutrition Assessments:   Nutrition Goals Re-Evaluation:   Nutrition Goals Re-Evaluation:   Nutrition Goals Discharge (Final Nutrition Goals  Re-Evaluation):   Psychosocial: Target Goals: Acknowledge presence or absence of significant depression and/or stress, maximize coping skills, provide positive support system. Participant is able to verbalize types and ability to use techniques and skills needed for reducing stress and depression.  Initial Review & Psychosocial Screening: Initial Psych Review & Screening - 12/10/18 0752      Initial Review   Current issues with  None Identified      Family Dynamics   Good Support System?  Yes   Pt lists his wife, sons, friends, and extended family as sources of support.      Barriers   Psychosocial barriers to participate in program  There are no identifiable barriers or psychosocial needs.      Screening Interventions   Interventions  Encouraged to exercise       Quality of Life Scores: Quality of Life - 12/10/18 0801      Quality of Life   Select  Quality of Life      Quality of Life Scores   Health/Function Pre  23.77 %    Socioeconomic Pre  24.75 %    Psych/Spiritual Pre  25.5 %    Family Pre  25.2 %    GLOBAL Pre  24.54 %      Scores of 19 and below usually indicate a poorer quality of life in these areas.  A difference of  2-3 points is a clinically meaningful difference.  A difference of 2-3 points in the total score of the Quality of Life Index has been associated with significant improvement in overall quality of life, self-image, physical symptoms, and general health in studies assessing change in quality of life.  PHQ-9: Recent Review Flowsheet Data    There is no flowsheet data to display.     Interpretation of Total Score  Total Score Depression Severity:  1-4 = Minimal depression, 5-9 = Mild depression, 10-14 = Moderate depression, 15-19 = Moderately severe depression, 20-27 = Severe depression   Psychosocial Evaluation and Intervention:   Psychosocial Re-Evaluation:   Psychosocial Discharge (Final Psychosocial Re-Evaluation):   Vocational  Rehabilitation: Provide vocational rehab assistance to qualifying candidates.   Vocational Rehab Evaluation & Intervention: Vocational Rehab - 12/10/18 5277      Initial Vocational Rehab Evaluation & Intervention   Assessment shows need for Vocational Rehabilitation  No       Education: Education Goals: Education classes will be provided on a weekly basis, covering required topics. Participant will state understanding/return demonstration of topics presented.  Learning Barriers/Preferences: Learning Barriers/Preferences - 12/10/18 1030      Learning Barriers/Preferences   Learning Barriers  Sight  Learning Preferences  Skilled Demonstration;Written Material;Individual Instruction       Education Topics: Count Your Pulse:  -Group instruction provided by verbal instruction, demonstration, patient participation and written materials to support subject.  Instructors address importance of being able to find your pulse and how to count your pulse when at home without a heart monitor.  Patients get hands on experience counting their pulse with staff help and individually.   Heart Attack, Angina, and Risk Factor Modification:  -Group instruction provided by verbal instruction, video, and written materials to support subject.  Instructors address signs and symptoms of angina and heart attacks.    Also discuss risk factors for heart disease and how to make changes to improve heart health risk factors.   Functional Fitness:  -Group instruction provided by verbal instruction, demonstration, patient participation, and written materials to support subject.  Instructors address safety measures for doing things around the house.  Discuss how to get up and down off the floor, how to pick things up properly, how to safely get out of a chair without assistance, and balance training.   Meditation and Mindfulness:  -Group instruction provided by verbal instruction, patient participation, and  written materials to support subject.  Instructor addresses importance of mindfulness and meditation practice to help reduce stress and improve awareness.  Instructor also leads participants through a meditation exercise.    Stretching for Flexibility and Mobility:  -Group instruction provided by verbal instruction, patient participation, and written materials to support subject.  Instructors lead participants through series of stretches that are designed to increase flexibility thus improving mobility.  These stretches are additional exercise for major muscle groups that are typically performed during regular warm up and cool down.   Hands Only CPR:  -Group verbal, video, and participation provides a basic overview of AHA guidelines for community CPR. Role-play of emergencies allow participants the opportunity to practice calling for help and chest compression technique with discussion of AED use.   Hypertension: -Group verbal and written instruction that provides a basic overview of hypertension including the most recent diagnostic guidelines, risk factor reduction with self-care instructions and medication management.    Nutrition I class: Heart Healthy Eating:  -Group instruction provided by PowerPoint slides, verbal discussion, and written materials to support subject matter. The instructor gives an explanation and review of the Therapeutic Lifestyle Changes diet recommendations, which includes a discussion on lipid goals, dietary fat, sodium, fiber, plant stanol/sterol esters, sugar, and the components of a well-balanced, healthy diet.   Nutrition II class: Lifestyle Skills:  -Group instruction provided by PowerPoint slides, verbal discussion, and written materials to support subject matter. The instructor gives an explanation and review of label reading, grocery shopping for heart health, heart healthy recipe modifications, and ways to make healthier choices when eating out.   Diabetes  Question & Answer:  -Group instruction provided by PowerPoint slides, verbal discussion, and written materials to support subject matter. The instructor gives an explanation and review of diabetes co-morbidities, pre- and post-prandial blood glucose goals, pre-exercise blood glucose goals, signs, symptoms, and treatment of hypoglycemia and hyperglycemia, and foot care basics.   Diabetes Blitz:  -Group instruction provided by PowerPoint slides, verbal discussion, and written materials to support subject matter. The instructor gives an explanation and review of the physiology behind type 1 and type 2 diabetes, diabetes medications and rational behind using different medications, pre- and post-prandial blood glucose recommendations and Hemoglobin A1c goals, diabetes diet, and exercise including blood glucose guidelines for exercising  safely.    Portion Distortion:  -Group instruction provided by PowerPoint slides, verbal discussion, written materials, and food models to support subject matter. The instructor gives an explanation of serving size versus portion size, changes in portions sizes over the last 20 years, and what consists of a serving from each food group.   Stress Management:  -Group instruction provided by verbal instruction, video, and written materials to support subject matter.  Instructors review role of stress in heart disease and how to cope with stress positively.     Exercising on Your Own:  -Group instruction provided by verbal instruction, power point, and written materials to support subject.  Instructors discuss benefits of exercise, components of exercise, frequency and intensity of exercise, and end points for exercise.  Also discuss use of nitroglycerin and activating EMS.  Review options of places to exercise outside of rehab.  Review guidelines for sex with heart disease.   Cardiac Drugs I:  -Group instruction provided by verbal instruction and written materials to  support subject.  Instructor reviews cardiac drug classes: antiplatelets, anticoagulants, beta blockers, and statins.  Instructor discusses reasons, side effects, and lifestyle considerations for each drug class.   Cardiac Drugs II:  -Group instruction provided by verbal instruction and written materials to support subject.  Instructor reviews cardiac drug classes: angiotensin converting enzyme inhibitors (ACE-I), angiotensin II receptor blockers (ARBs), nitrates, and calcium channel blockers.  Instructor discusses reasons, side effects, and lifestyle considerations for each drug class.   Anatomy and Physiology of the Circulatory System:  Group verbal and written instruction and models provide basic cardiac anatomy and physiology, with the coronary electrical and arterial systems. Review of: AMI, Angina, Valve disease, Heart Failure, Peripheral Artery Disease, Cardiac Arrhythmia, Pacemakers, and the ICD.   Other Education:  -Group or individual verbal, written, or video instructions that support the educational goals of the cardiac rehab program.   Holiday Eating Survival Tips:  -Group instruction provided by PowerPoint slides, verbal discussion, and written materials to support subject matter. The instructor gives patients tips, tricks, and techniques to help them not only survive but enjoy the holidays despite the onslaught of food that accompanies the holidays.   Knowledge Questionnaire Score: Knowledge Questionnaire Score - 12/10/18 0745      Knowledge Questionnaire Score   Pre Score  23/24       Core Components/Risk Factors/Patient Goals at Admission: Personal Goals and Risk Factors at Admission - 12/10/18 1034      Core Components/Risk Factors/Patient Goals on Admission    Weight Management  Yes;Weight Loss    Intervention  Weight Management: Develop a combined nutrition and exercise program designed to reach desired caloric intake, while maintaining appropriate intake of  nutrient and fiber, sodium and fats, and appropriate energy expenditure required for the weight goal.;Weight Management: Provide education and appropriate resources to help participant work on and attain dietary goals.;Weight Management/Obesity: Establish reasonable short term and long term weight goals.    Admit Weight  188 lb 0.8 oz (85.3 kg)    Goal Weight: Long Term  178 lb (80.7 kg)    Expected Outcomes  Short Term: Continue to assess and modify interventions until short term weight is achieved;Long Term: Adherence to nutrition and physical activity/exercise program aimed toward attainment of established weight goal;Weight Loss: Understanding of general recommendations for a balanced deficit meal plan, which promotes 1-2 lb weight loss per week and includes a negative energy balance of [phone] kcal/d;Understanding recommendations for meals to include 15-35% energy as  protein, 25-35% energy from fat, 35-60% energy from carbohydrates, less than 200mg  of dietary cholesterol, 20-35 gm of total fiber daily;Understanding of distribution of calorie intake throughout the day with the consumption of 4-5 meals/snacks    Hypertension  Yes    Intervention  Provide education on lifestyle modifcations including regular physical activity/exercise, weight management, moderate sodium restriction and increased consumption of fresh fruit, vegetables, and low fat dairy, alcohol moderation, and smoking cessation.;Monitor prescription use compliance.    Expected Outcomes  Short Term: Continued assessment and intervention until BP is < 140/17mm HG in hypertensive participants. < 130/57mm HG in hypertensive participants with diabetes, heart failure or chronic kidney disease.;Long Term: Maintenance of blood pressure at goal levels.    Lipids  Yes    Intervention  Provide education and support for participant on nutrition & aerobic/resistive exercise along with prescribed medications to achieve LDL 70mg , HDL >40mg .     Expected Outcomes  Short Term: Participant states understanding of desired cholesterol values and is compliant with medications prescribed. Participant is following exercise prescription and nutrition guidelines.;Long Term: Cholesterol controlled with medications as prescribed, with individualized exercise RX and with personalized nutrition plan. Value goals: LDL < 70mg , HDL > 40 mg.       Core Components/Risk Factors/Patient Goals Review:    Core Components/Risk Factors/Patient Goals at Discharge (Final Review):    ITP Comments: ITP Comments    Row Name 12/10/18 1009           ITP Comments  Dr. Fransico Him, Medical Director           Comments: Patient attended orientation from 0732 to 0845 to review rules and guidelines for program. Completed 6 minute walk test, Intitial ITP, and exercise prescription.  VSS. Telemetry-SR.  Asymptomatic.

## 2018-12-11 NOTE — Progress Notes (Signed)
Andrew Mitchell 58 y.o. male DOB: 02/07/1961 MRN: [id number]      Nutrition Note  1. NSTEMI (non-ST elevated myocardial infarction) ([place])   2. Status post coronary artery stent placement    Past Medical History:  Diagnosis Date  . Back pain   . CAD (coronary artery disease)    a. 09/2018 Inf STEMI/PCI: LM nl, LAD min irregs, D1/2 min irregs, LCX 100ost/p thrombotic (2.75x35 Orsiro DES), RCA 40d, RPAV 90.  . Carpal tunnel syndrome   . DDD (degenerative disc disease)   . Diastolic dysfunction    a. 09/2018 Echo: EF 50-55%, prob inflat and antlat HK, Gr1 DD. Mildlly dil Ao root (72m). Nl RV fxn.  . Hyperlipemia   . Mildly Dilated aortic root ([place])    a. 09/2018 Echo: 45mm.  . Tubular adenocarcinoma ([place]) 2010   colon   Meds reviewed.    Current Outpatient Medications (Cardiovascular):  .  ezetimibe (ZETIA) 10 MG tablet, Take 1 tablet (10 mg total) by mouth daily. Please make overdue appt with Dr. Tamala Julian before anymore refills. 2nd attempt .  metoprolol tartrate (LOPRESSOR) 25 MG tablet, Take 1 tablet (25 mg total) by mouth 2 (two) times daily. .  nitroGLYCERIN (NITROSTAT) 0.4 MG SL tablet, Place 1 tablet (0.4 mg total) under the tongue every 5 (five) minutes x 3 doses as needed for chest pain. .  rosuvastatin (CRESTOR) 40 MG tablet, Take 1 tablet twice weekly and increase as tolerated. Please make overdue appt with Dr. Tamala Julian before anymore refills. 2nd attempt (Patient not taking: Reported on 12/04/2018)   Current Outpatient Medications (Analgesics):  .  aspirin 81 MG tablet, Take 81 mg by mouth daily.  Current Outpatient Medications (Hematological):  .  ticagrelor (BRILINTA) 90 MG TABS tablet, Take 1 tablet (90 mg total) by mouth 2 (two) times daily. .  vitamin B-12 (CYANOCOBALAMIN) 1000 MCG tablet, Take 3,000 mcg by mouth daily.   Current Outpatient Medications (Other):  .  carbidopa-levodopa (SINEMET IR) 25-100 MG per tablet, Take 1/2 to 1 tablet by mouth daily before bedtime. Javier Docker Oil 1000 MG CAPS, Take 1,000 mg by mouth daily. Marland Kitchen  lisdexamfetamine (VYVANSE) 40 MG capsule, Take 40 mg by mouth every morning. .  Multiple Vitamins-Minerals (CENTRUM PO), Take 1 tablet by mouth daily. .  saw palmetto 160 MG capsule, Take 320 mg by mouth daily. .  vitamin C (ASCORBIC ACID) 500 MG tablet, Take 500 mg by mouth daily.   HT: Ht Readings from Last 1 Encounters:  12/10/18 5' 9.75" (1.772 m)    WT: Wt Readings from Last 5 Encounters:  12/10/18 188 lb 0.8 oz (85.3 kg)  10/23/18 187 lb 12.8 oz (85.2 kg)  10/08/18 182 lb (82.6 kg)  06/18/17 180 lb (81.6 kg)  06/03/17 188 lb (85.3 kg)     Body mass index is 27.18 kg/m.   Current tobacco use? No  Labs:  Lipid Panel     Component Value Date/Time   CHOL 72 (L) 12/01/2018 0742   TRIG 50 12/01/2018 0742   HDL 39 (L) 12/01/2018 0742   CHOLHDL 1.8 12/01/2018 0742   CHOLHDL 3.3 10/09/2018 0028   VLDL 7 10/09/2018 0028   LDLCALC 23 12/01/2018 0742    Lab Results  Component Value Date   HGBA1C 5.5 12/01/2018   CBG (last 3)  No results for input(s): GLUCAP in the last 72 hours.  Nutrition Note Spoke with pt. Nutrition plan and goals reviewed with pt. Pt is following Step  1 of the Therapeutic Lifestyle Changes diet. Pt wants to lose wt, ~5-10 lbs. Pt has not been trying to lose wt. Wt loss tips reviewed (label reading, how to build a healthy plate, portion sizes, eating frequently across the day). Dietary assessment revealed pt eats fairly healthy but does eat out 5x a week for lunch and eats fried foods 3x a week on average. Per discussion, pt does not use canned/convenience foods often. Pt does not add salt to food. Pt does eat out frequently. Pt expressed understanding of the information reviewed. Pt aware of nutrition education classes offered and would like to attend nutrition classes.  Nutrition Diagnosis ? Food-and nutrition-related knowledge deficit related to lack of exposure to information as related to  diagnosis of: ? CVD  ? Overweight  related to excessive energy intake as evidenced by a Body mass index is 27.18 kg/m.  Nutrition Intervention ? Pt's individual nutrition plan and goals reviewed with pt.  Nutrition Goal(s):  ? Pt to identify and limit food sources of saturated fat, trans fat, refined carbohydrates and sodium ? Pt to identify food quantities necessary to achieve weight loss of 6-24 lb at graduation from cardiac rehab.  ? Pt to build a healthy plate including vegetables, fruits, whole grains, and low-fat dairy products in a heart healthy meal plan. ? Pt to decrease consumption of fried foods  Plan:  ? Pt to attend nutrition classes:  ? Nutrition I ? Nutrition II ? Portion Distortion  ? Will provide client-centered nutrition education as part of interdisciplinary care ? Monitor and evaluate progress toward nutrition goal with team.   Laurina Bustle, MS, RD, LDN 12/11/2018 8:29 AM

## 2018-12-14 ENCOUNTER — Encounter (HOSPITAL_COMMUNITY)
Admission: RE | Admit: 2018-12-14 | Discharge: 2018-12-14 | Disposition: A | Discharge status: Home / Self Care | Payer: 59 | Source: Ambulatory Visit | Attending: Interventional Cardiology | Admitting: Interventional Cardiology

## 2018-12-14 DIAGNOSIS — I214 Non-ST elevation (NSTEMI) myocardial infarction: Secondary | ICD-10-CM | POA: Diagnosis not present

## 2018-12-14 DIAGNOSIS — Z955 Presence of coronary angioplasty implant and graft: Secondary | ICD-10-CM

## 2018-12-14 NOTE — Progress Notes (Signed)
Daily Session Note  Patient Details  Name: Andrew Mitchell MRN: [id number] Date of Birth: Jul 26, 1961 Referring Provider:     CARDIAC REHAB PHASE II ORIENTATION from 12/10/2018 in [place]  Referring Provider  Belva Crome MD       Encounter Date: 12/14/2018  Check In: Session Check In - 12/14/18 0729      Check-In   Supervising physician immediately available to respond to emergencies  [facility] immediately available    Physician(s)  Dr. Maylene Roes    Location  [facility]    Staff Present  Dorma Russell, MS,ACSM CEP, Exercise Physiologist;Nekeisha Aure Karle Starch, RN, BSN;Molly DiVincenzo, MS, ACSM RCEP, Exercise Physiologist    Medication changes reported      No    Fall or balance concerns reported     No    Tobacco Cessation  No Change    Warm-up and Cool-down  Performed as group-led instruction    Resistance Training Performed  Yes    VAD Patient?  No    PAD/SET Patient?  No      Pain Assessment   Currently in Pain?  No/denies    Multiple Pain Sites  No       Capillary Blood Glucose: No results found for this or any previous visit (from the past 24 hour(s)).  Exercise Prescription Changes - 12/14/18 0800      Response to Exercise   Blood Pressure (Admit)  120/80    Blood Pressure (Exercise)  134/80    Blood Pressure (Exit)  110/72    Heart Rate (Admit)  77 bpm    Heart Rate (Exercise)  103 bpm    Heart Rate (Exit)  76 bpm    Rating of Perceived Exertion (Exercise)  11    Perceived Dyspnea (Exercise)  0    Symptoms  None    Comments  Pt oriented to exercise equipment     Duration  Progress to 30 minutes of  aerobic without signs/symptoms of physical distress    Intensity  THRR unchanged      Progression   Progression  Continue to progress workloads to maintain intensity without signs/symptoms of physical distress.    Average METs  3.38      Resistance Training   Training Prescription  Yes    Weight  5lbs    Reps   10-15    Time  10 Minutes      Treadmill   MPH  3    Grade  1    Minutes  10    METs  3.71      Bike   Level  1    Minutes  10    METs  3.23      NuStep   Level  2    SPM  95    Minutes  10    METs  3.2       Social History   Tobacco Use  Smoking Status Never Smoker  Smokeless Tobacco Never Used    Goals Met:  Exercise tolerated well  Goals Unmet:  Not Applicable  Comments: Pt started cardiac rehab today.  Pt tolerated light exercise without difficulty. VSS, telemetry-SR, asymptomatic.  Medication list reconciled. Pt denies barriers to medicaiton compliance.  PSYCHOSOCIAL ASSESSMENT:  PHQ-0. Pt exhibits positive coping skills, hopeful outlook with supportive family. No psychosocial needs identified at this time, no psychosocial interventions necessary.  Pt oriented to exercise equipment and routine.  Understanding verbalized.  Dr. Traci Turner is Medical Director for Cardiac Rehab at [facility]. 

## 2018-12-16 ENCOUNTER — Encounter (HOSPITAL_COMMUNITY): Payer: 59

## 2018-12-17 NOTE — Progress Notes (Signed)
Cardiac Individual Treatment Plan  Patient Details  Name: Andrew Mitchell MRN: [id number] Date of Birth: Nov 21, 1960 Referring Provider:     CARDIAC REHAB PHASE II ORIENTATION from 12/10/2018 in [facility]  Referring Provider  Belva Crome MD       Initial Encounter Date:    CARDIAC REHAB PHASE II ORIENTATION from 12/10/2018 in [facility]  Date  12/10/18      Visit Diagnosis: NSTEMI (non-ST elevated myocardial infarction) [facility])  Status post coronary artery stent placement  Patient's Home Medications on Admission:  Current Outpatient Medications:  .  aspirin 81 MG tablet, Take 81 mg by mouth daily., Disp: , Rfl:  .  carbidopa-levodopa (SINEMET IR) 25-100 MG per tablet, Take 1/2 to 1 tablet by mouth daily before bedtime., Disp: , Rfl:  .  ezetimibe (ZETIA) 10 MG tablet, Take 1 tablet (10 mg total) by mouth daily. Please make overdue appt with Dr. Tamala Julian before anymore refills. 2nd attempt, Disp: 90 tablet, Rfl: 0 .  Krill Oil 1000 MG CAPS, Take 1,000 mg by mouth daily., Disp: , Rfl:  .  lisdexamfetamine (VYVANSE) 40 MG capsule, Take 40 mg by mouth every morning., Disp: , Rfl:  .  metoprolol tartrate (LOPRESSOR) 25 MG tablet, Take 1 tablet (25 mg total) by mouth 2 (two) times daily., Disp: 60 tablet, Rfl: 6 .  Multiple Vitamins-Minerals (CENTRUM PO), Take 1 tablet by mouth daily., Disp: , Rfl:  .  nitroGLYCERIN (NITROSTAT) 0.4 MG SL tablet, Place 1 tablet (0.4 mg total) under the tongue every 5 (five) minutes x 3 doses as needed for chest pain., Disp: 25 tablet, Rfl: 3 .  saw palmetto 160 MG capsule, Take 320 mg by mouth daily., Disp: , Rfl:  .  ticagrelor (BRILINTA) 90 MG TABS tablet, Take 1 tablet (90 mg total) by mouth 2 (two) times daily., Disp: 60 tablet, Rfl: 6 .  vitamin B-12 (CYANOCOBALAMIN) 1000 MCG tablet, Take 3,000 mcg by mouth daily. , Disp: , Rfl:  .  vitamin C (ASCORBIC ACID) 500 MG tablet, Take 500 mg by mouth  daily., Disp: , Rfl:  .  rosuvastatin (CRESTOR) 40 MG tablet, Take 1 tablet twice weekly and increase as tolerated. Please make overdue appt with Dr. Tamala Julian before anymore refills. 2nd attempt (Patient not taking: Reported on 12/04/2018), Disp: 10 tablet, Rfl: 11  Past Medical History: Past Medical History:  Diagnosis Date  . Back pain   . CAD (coronary artery disease)    a. 09/2018 Inf STEMI/PCI: LM nl, LAD min irregs, D1/2 min irregs, LCX 100ost/p thrombotic (2.75x35 Orsiro DES), RCA 40d, RPAV 90.  . Carpal tunnel syndrome   . DDD (degenerative disc disease)   . Diastolic dysfunction    a. 09/2018 Echo: EF 50-55%, prob inflat and antlat HK, Gr1 DD. Mildlly dil Ao root (60m). Nl RV fxn.  . Hyperlipemia   . Mildly Dilated aortic root ([place])    a. 09/2018 Echo: 52mm.  . Tubular adenocarcinoma ([place]) 2010   colon    Tobacco Use: Social History   Tobacco Use  Smoking Status Never Smoker  Smokeless Tobacco Never Used    Labs: Recent Review Flowsheet Data    Labs for ITP Cardiac and Pulmonary Rehab Latest Ref Rng & Units 10/01/2017 02/06/2018 10/08/2018 10/09/2018 12/01/2018   Cholestrol 100 - 199 mg/dL 187 122 155 126 72(L)   LDLCALC 0 - 99 mg/dL 112(H) 62 76 81 23   HDL >39 mg/dL  38(L) 44 43 38(L) 39(L)   Trlycerides 0 - 149 mg/dL 186(H) 79 182(H) 36 50   Hemoglobin A1c 4.8 - 5.6 % - - - - 5.5   TCO2 22 - 32 mmol/L - - 26 - -      Capillary Blood Glucose: No results found for: GLUCAP   Exercise Target Goals: Exercise Program Goal: Individual exercise prescription set using results from initial 6 min walk test and THRR while considering  patient's activity barriers and safety.   Exercise Prescription Goal: Initial exercise prescription builds to 30-45 minutes a day of aerobic activity, 2-3 days per week.  Home exercise guidelines will be given to patient during program as part of exercise prescription that the participant will acknowledge.  Activity Barriers & Risk  Stratification: Activity Barriers & Cardiac Risk Stratification - 12/10/18 1018      Activity Barriers & Cardiac Risk Stratification   Activity Barriers  None    Cardiac Risk Stratification  High       6 Minute Walk: 6 Minute Walk    Row Name 12/10/18 1016         6 Minute Walk   Phase  Initial     Distance  1946 feet     Walk Time  6 minutes     # of Rest Breaks  0     MPH  3.69     METS  4.53     RPE  11     Perceived Dyspnea   0     VO2 Peak  15.84     Symptoms  No     Resting HR  65 bpm     Resting BP  118/70     Resting Oxygen Saturation   98 %     Exercise Oxygen Saturation  during 6 min walk  99 %     Max Ex. HR  83 bpm     Max Ex. BP  122/78     2 Minute Post BP  124/72        Oxygen Initial Assessment:   Oxygen Re-Evaluation:   Oxygen Discharge (Final Oxygen Re-Evaluation):   Initial Exercise Prescription: Initial Exercise Prescription - 12/10/18 1000      Date of Initial Exercise RX and Referring Provider   Date  12/10/18    Referring Provider  Belva Crome MD     Expected Discharge Date  03/17/19      Treadmill   MPH  3    Grade  1    Minutes  10    METs  3.71      Bike   Level  1    Minutes  10    METs  3.22      NuStep   Level  2    SPM  85    Minutes  10    METs  3      Prescription Details   Frequency (times per week)  3x    Duration  Progress to 30 minutes of continuous aerobic without signs/symptoms of physical distress      Intensity   THRR 40-80% of Max Heartrate  65-130    Ratings of Perceived Exertion  11-13    Perceived Dyspnea  0-4      Progression   Progression  Continue progressive overload as per policy without signs/symptoms or physical distress.      Resistance Training   Training Prescription  Yes    Weight  5lbs  Reps  10-15       Perform Capillary Blood Glucose checks as needed.  Exercise Prescription Changes: Exercise Prescription Changes    Row Name 12/14/18 0800              Response to Exercise   Blood Pressure (Admit)  120/80       Blood Pressure (Exercise)  134/80       Blood Pressure (Exit)  110/72       Heart Rate (Admit)  77 bpm       Heart Rate (Exercise)  103 bpm       Heart Rate (Exit)  76 bpm       Rating of Perceived Exertion (Exercise)  11       Perceived Dyspnea (Exercise)  0       Symptoms  None       Comments  Pt oriented to exercise equipment        Duration  Progress to 30 minutes of  aerobic without signs/symptoms of physical distress       Intensity  THRR unchanged         Progression   Progression  Continue to progress workloads to maintain intensity without signs/symptoms of physical distress.       Average METs  3.38         Resistance Training   Training Prescription  Yes       Weight  5lbs       Reps  10-15       Time  10 Minutes         Treadmill   MPH  3       Grade  1       Minutes  10       METs  3.71         Bike   Level  1       Minutes  10       METs  3.23         NuStep   Level  2       SPM  95       Minutes  10       METs  3.2          Exercise Comments: Exercise Comments    Row Name 12/14/18 0807           Exercise Comments  Pt's first day of exercise. Pt oriented to exercise equipment. Pt responded well to workloads. Will continue to monitor and progress pt as tolerated.           Exercise Goals and Review: Exercise Goals    Row Name 12/10/18 1018             Exercise Goals   Increase Physical Activity  Yes       Intervention  Provide advice, education, support and counseling about physical activity/exercise needs.;Develop an individualized exercise prescription for aerobic and resistive training based on initial evaluation findings, risk stratification, comorbidities and participant's personal goals.       Expected Outcomes  Short Term: Attend rehab on a regular basis to increase amount of physical activity.;Long Term: Add in home exercise to make exercise part of routine and to increase  amount of physical activity.;Long Term: Exercising regularly at least 3-5 days a week.       Increase Strength and Stamina  Yes       Intervention  Provide advice, education, support and counseling about physical  activity/exercise needs.;Develop an individualized exercise prescription for aerobic and resistive training based on initial evaluation findings, risk stratification, comorbidities and participant's personal goals.       Expected Outcomes  Short Term: Increase workloads from initial exercise prescription for resistance, speed, and METs.;Short Term: Perform resistance training exercises routinely during rehab and add in resistance training at home;Long Term: Improve cardiorespiratory fitness, muscular endurance and strength as measured by increased METs and functional capacity (6MWT)       Able to understand and use rate of perceived exertion (RPE) scale  Yes       Intervention  Provide education and explanation on how to use RPE scale       Expected Outcomes  Short Term: Able to use RPE daily in rehab to express subjective intensity level;Long Term:  Able to use RPE to guide intensity level when exercising independently       Knowledge and understanding of Target Heart Rate Range (THRR)  Yes       Intervention  Provide education and explanation of THRR including how the numbers were predicted and where they are located for reference       Expected Outcomes  Short Term: Able to state/look up THRR;Short Term: Able to use daily as guideline for intensity in rehab;Long Term: Able to use THRR to govern intensity when exercising independently       Able to check pulse independently  Yes       Intervention  Provide education and demonstration on how to check pulse in carotid and radial arteries.;Review the importance of being able to check your own pulse for safety during independent exercise       Expected Outcomes  Short Term: Able to explain why pulse checking is important during independent  exercise;Long Term: Able to check pulse independently and accurately       Understanding of Exercise Prescription  Yes       Intervention  Provide education, explanation, and written materials on patient's individual exercise prescription       Expected Outcomes  Short Term: Able to explain program exercise prescription;Long Term: Able to explain home exercise prescription to exercise independently          Exercise Goals Re-Evaluation :   Discharge Exercise Prescription (Final Exercise Prescription Changes): Exercise Prescription Changes - 12/14/18 0800      Response to Exercise   Blood Pressure (Admit)  120/80    Blood Pressure (Exercise)  134/80    Blood Pressure (Exit)  110/72    Heart Rate (Admit)  77 bpm    Heart Rate (Exercise)  103 bpm    Heart Rate (Exit)  76 bpm    Rating of Perceived Exertion (Exercise)  11    Perceived Dyspnea (Exercise)  0    Symptoms  None    Comments  Pt oriented to exercise equipment     Duration  Progress to 30 minutes of  aerobic without signs/symptoms of physical distress    Intensity  THRR unchanged      Progression   Progression  Continue to progress workloads to maintain intensity without signs/symptoms of physical distress.    Average METs  3.38      Resistance Training   Training Prescription  Yes    Weight  5lbs    Reps  10-15    Time  10 Minutes      Treadmill   MPH  3    Grade  1    Minutes  10  METs  3.71      Bike   Level  1    Minutes  10    METs  3.23      NuStep   Level  2    SPM  95    Minutes  10    METs  3.2       Nutrition:  Target Goals: Understanding of nutrition guidelines, daily intake of sodium 1500mg , cholesterol 200mg , calories 30% from fat and 7% or less from saturated fats, daily to have 5 or more servings of fruits and vegetables.  Biometrics: Pre Biometrics - 12/10/18 1017      Pre Biometrics   Height  5' 9.75" (1.772 m)    Weight  85.3 kg    Waist Circumference  37 inches    Hip  Circumference  41 inches    Waist to Hip Ratio  0.9 %    BMI (Calculated)  27.17    Triceps Skinfold  20 mm    % Body Fat  26.6 %    Grip Strength  42 kg    Flexibility  13 in    Single Leg Stand  30 seconds        Nutrition Therapy Plan and Nutrition Goals: Nutrition Therapy & Goals - 12/11/18 0835      Nutrition Therapy   Diet  Heart healthy      Personal Nutrition Goals   Nutrition Goal  Pt to identify and limit food sources of saturated fat, trans fat, refined carbohydrates and sodium    Personal Goal #2  Pt to identify food quantities necessary to achieve weight loss of 6-24 lb at graduation from cardiac rehab    Personal Goal #3  Pt to build a healthy plate including vegetables, fruits, whole grains, and low-fat dairy products in a heart healthy meal plan    Personal Goal #4  Pt to decrease consumption of fried foods      Intervention Plan   Intervention  Prescribe, educate and counsel regarding individualized specific dietary modifications aiming towards targeted core components such as weight, hypertension, lipid management, diabetes, heart failure and other comorbidities.    Expected Outcomes  Short Term Goal: Understand basic principles of dietary content, such as calories, fat, sodium, cholesterol and nutrients.;Long Term Goal: Adherence to prescribed nutrition plan.       Nutrition Assessments: Nutrition Assessments - 12/11/18 0838      MEDFICTS Scores   Pre Score  26       Nutrition Goals Re-Evaluation: Nutrition Goals Re-Evaluation    [place] Name 12/11/18 0835             Goals   Current Weight  188 lb 0.8 oz (85.3 kg)          Nutrition Goals Re-Evaluation: Nutrition Goals Re-Evaluation    [place] Name 12/11/18 0835             Goals   Current Weight  188 lb 0.8 oz (85.3 kg)          Nutrition Goals Discharge (Final Nutrition Goals Re-Evaluation): Nutrition Goals Re-Evaluation - 12/11/18 0835      Goals   Current Weight  188 lb 0.8 oz (85.3  kg)       Psychosocial: Target Goals: Acknowledge presence or absence of significant depression and/or stress, maximize coping skills, provide positive support system. Participant is able to verbalize types and ability to use techniques and skills needed for reducing stress and depression.  Initial Review &  Psychosocial Screening: Initial Psych Review & Screening - 12/10/18 0752      Initial Review   Current issues with  None Identified      Family Dynamics   Good Support System?  Yes   Pt lists his wife, sons, friends, and extended family as sources of support.      Barriers   Psychosocial barriers to participate in program  There are no identifiable barriers or psychosocial needs.      Screening Interventions   Interventions  Encouraged to exercise       Quality of Life Scores: Quality of Life - 12/10/18 0801      Quality of Life   Select  Quality of Life      Quality of Life Scores   Health/Function Pre  23.77 %    Socioeconomic Pre  24.75 %    Psych/Spiritual Pre  25.5 %    Family Pre  25.2 %    GLOBAL Pre  24.54 %      Scores of 19 and below usually indicate a poorer quality of life in these areas.  A difference of  2-3 points is a clinically meaningful difference.  A difference of 2-3 points in the total score of the Quality of Life Index has been associated with significant improvement in overall quality of life, self-image, physical symptoms, and general health in studies assessing change in quality of life.  PHQ-9: Recent Review Flowsheet Data    Depression screen [facility] 2/9 12/14/2018   Decreased Interest 0   Down, Depressed, Hopeless 0   PHQ - 2 Score 0     Interpretation of Total Score  Total Score Depression Severity:  1-4 = Minimal depression, 5-9 = Mild depression, 10-14 = Moderate depression, 15-19 = Moderately severe depression, 20-27 = Severe depression   Psychosocial Evaluation and Intervention: Psychosocial Evaluation - 12/14/18 0809       Psychosocial Evaluation & Interventions   Interventions  Encouraged to exercise with the program and follow exercise prescription    Comments  No psychosocial needs identified.  Andrew Mitchell enjoys golfing, exercising, and yard work.     Expected Outcomes  Andrew Mitchell will maintain a positive outlook with good coping skills.    Continue Psychosocial Services   No Follow up required       Psychosocial Re-Evaluation:   Psychosocial Discharge (Final Psychosocial Re-Evaluation):   Vocational Rehabilitation: Provide vocational rehab assistance to qualifying candidates.   Vocational Rehab Evaluation & Intervention: Vocational Rehab - 12/10/18 3299      Initial Vocational Rehab Evaluation & Intervention   Assessment shows need for Vocational Rehabilitation  No       Education: Education Goals: Education classes will be provided on a weekly basis, covering required topics. Participant will state understanding/return demonstration of topics presented.  Learning Barriers/Preferences: Learning Barriers/Preferences - 12/10/18 1030      Learning Barriers/Preferences   Learning Barriers  Sight    Learning Preferences  Skilled Demonstration;Written Material;Individual Instruction       Education Topics: Count Your Pulse:  -Group instruction provided by verbal instruction, demonstration, patient participation and written materials to support subject.  Instructors address importance of being able to find your pulse and how to count your pulse when at home without a heart monitor.  Patients get hands on experience counting their pulse with staff help and individually.   Heart Attack, Angina, and Risk Factor Modification:  -Group instruction provided by verbal instruction, video, and written materials to support subject.  Instructors  address signs and symptoms of angina and heart attacks.    Also discuss risk factors for heart disease and how to make changes to improve heart health risk  factors.   Functional Fitness:  -Group instruction provided by verbal instruction, demonstration, patient participation, and written materials to support subject.  Instructors address safety measures for doing things around the house.  Discuss how to get up and down off the floor, how to pick things up properly, how to safely get out of a chair without assistance, and balance training.   Meditation and Mindfulness:  -Group instruction provided by verbal instruction, patient participation, and written materials to support subject.  Instructor addresses importance of mindfulness and meditation practice to help reduce stress and improve awareness.  Instructor also leads participants through a meditation exercise.    Stretching for Flexibility and Mobility:  -Group instruction provided by verbal instruction, patient participation, and written materials to support subject.  Instructors lead participants through series of stretches that are designed to increase flexibility thus improving mobility.  These stretches are additional exercise for major muscle groups that are typically performed during regular warm up and cool down.   Hands Only CPR:  -Group verbal, video, and participation provides a basic overview of AHA guidelines for community CPR. Role-play of emergencies allow participants the opportunity to practice calling for help and chest compression technique with discussion of AED use.   Hypertension: -Group verbal and written instruction that provides a basic overview of hypertension including the most recent diagnostic guidelines, risk factor reduction with self-care instructions and medication management.    Nutrition I class: Heart Healthy Eating:  -Group instruction provided by PowerPoint slides, verbal discussion, and written materials to support subject matter. The instructor gives an explanation and review of the Therapeutic Lifestyle Changes diet recommendations, which includes a  discussion on lipid goals, dietary fat, sodium, fiber, plant stanol/sterol esters, sugar, and the components of a well-balanced, healthy diet.   Nutrition II class: Lifestyle Skills:  -Group instruction provided by PowerPoint slides, verbal discussion, and written materials to support subject matter. The instructor gives an explanation and review of label reading, grocery shopping for heart health, heart healthy recipe modifications, and ways to make healthier choices when eating out.   Diabetes Question & Answer:  -Group instruction provided by PowerPoint slides, verbal discussion, and written materials to support subject matter. The instructor gives an explanation and review of diabetes co-morbidities, pre- and post-prandial blood glucose goals, pre-exercise blood glucose goals, signs, symptoms, and treatment of hypoglycemia and hyperglycemia, and foot care basics.   Diabetes Blitz:  -Group instruction provided by PowerPoint slides, verbal discussion, and written materials to support subject matter. The instructor gives an explanation and review of the physiology behind type 1 and type 2 diabetes, diabetes medications and rational behind using different medications, pre- and post-prandial blood glucose recommendations and Hemoglobin A1c goals, diabetes diet, and exercise including blood glucose guidelines for exercising safely.    Portion Distortion:  -Group instruction provided by PowerPoint slides, verbal discussion, written materials, and food models to support subject matter. The instructor gives an explanation of serving size versus portion size, changes in portions sizes over the last 20 years, and what consists of a serving from each food group.   Stress Management:  -Group instruction provided by verbal instruction, video, and written materials to support subject matter.  Instructors review role of stress in heart disease and how to cope with stress positively.     Exercising on Your  Own:  -Group instruction provided by verbal instruction, power point, and written materials to support subject.  Instructors discuss benefits of exercise, components of exercise, frequency and intensity of exercise, and end points for exercise.  Also discuss use of nitroglycerin and activating EMS.  Review options of places to exercise outside of rehab.  Review guidelines for sex with heart disease.   Cardiac Drugs I:  -Group instruction provided by verbal instruction and written materials to support subject.  Instructor reviews cardiac drug classes: antiplatelets, anticoagulants, beta blockers, and statins.  Instructor discusses reasons, side effects, and lifestyle considerations for each drug class.   Cardiac Drugs II:  -Group instruction provided by verbal instruction and written materials to support subject.  Instructor reviews cardiac drug classes: angiotensin converting enzyme inhibitors (ACE-I), angiotensin II receptor blockers (ARBs), nitrates, and calcium channel blockers.  Instructor discusses reasons, side effects, and lifestyle considerations for each drug class.   Anatomy and Physiology of the Circulatory System:  Group verbal and written instruction and models provide basic cardiac anatomy and physiology, with the coronary electrical and arterial systems. Review of: AMI, Angina, Valve disease, Heart Failure, Peripheral Artery Disease, Cardiac Arrhythmia, Pacemakers, and the ICD.   Other Education:  -Group or individual verbal, written, or video instructions that support the educational goals of the cardiac rehab program.   Holiday Eating Survival Tips:  -Group instruction provided by PowerPoint slides, verbal discussion, and written materials to support subject matter. The instructor gives patients tips, tricks, and techniques to help them not only survive but enjoy the holidays despite the onslaught of food that accompanies the holidays.   Knowledge Questionnaire  Score: Knowledge Questionnaire Score - 12/10/18 0745      Knowledge Questionnaire Score   Pre Score  23/24       Core Components/Risk Factors/Patient Goals at Admission: Personal Goals and Risk Factors at Admission - 12/10/18 1034      Core Components/Risk Factors/Patient Goals on Admission    Weight Management  Yes;Weight Loss    Intervention  Weight Management: Develop a combined nutrition and exercise program designed to reach desired caloric intake, while maintaining appropriate intake of nutrient and fiber, sodium and fats, and appropriate energy expenditure required for the weight goal.;Weight Management: Provide education and appropriate resources to help participant work on and attain dietary goals.;Weight Management/Obesity: Establish reasonable short term and long term weight goals.    Admit Weight  188 lb 0.8 oz (85.3 kg)    Goal Weight: Long Term  178 lb (80.7 kg)    Expected Outcomes  Short Term: Continue to assess and modify interventions until short term weight is achieved;Long Term: Adherence to nutrition and physical activity/exercise program aimed toward attainment of established weight goal;Weight Loss: Understanding of general recommendations for a balanced deficit meal plan, which promotes 1-2 lb weight loss per week and includes a negative energy balance of [phone] kcal/d;Understanding recommendations for meals to include 15-35% energy as protein, 25-35% energy from fat, 35-60% energy from carbohydrates, less than 200mg  of dietary cholesterol, 20-35 gm of total fiber daily;Understanding of distribution of calorie intake throughout the day with the consumption of 4-5 meals/snacks    Hypertension  Yes    Intervention  Provide education on lifestyle modifcations including regular physical activity/exercise, weight management, moderate sodium restriction and increased consumption of fresh fruit, vegetables, and low fat dairy, alcohol moderation, and smoking cessation.;Monitor  prescription use compliance.    Expected Outcomes  Short Term: Continued assessment and intervention until BP is < 140/65mm  HG in hypertensive participants. < 130/45mm HG in hypertensive participants with diabetes, heart failure or chronic kidney disease.;Long Term: Maintenance of blood pressure at goal levels.    Lipids  Yes    Intervention  Provide education and support for participant on nutrition & aerobic/resistive exercise along with prescribed medications to achieve LDL 70mg , HDL >40mg .    Expected Outcomes  Short Term: Participant states understanding of desired cholesterol values and is compliant with medications prescribed. Participant is following exercise prescription and nutrition guidelines.;Long Term: Cholesterol controlled with medications as prescribed, with individualized exercise RX and with personalized nutrition plan. Value goals: LDL < 70mg , HDL > 40 mg.       Core Components/Risk Factors/Patient Goals Review:  Goals and Risk Factor Review    Row Name 12/14/18 0828             Core Components/Risk Factors/Patient Goals Review   Personal Goals Review  Weight Management/Obesity;Lipids;Hypertension       Review  Pt with multiple CAD RFs willing to participate in CR exercise.  Andrew Mitchell would ike to get back to walking 5 miles a day and keep up an exercise routine.        Expected Outcomes  Pt will continue to participate in CR exercise, nutrition, and lifestyle modification opportunities.           Core Components/Risk Factors/Patient Goals at Discharge (Final Review):  Goals and Risk Factor Review - 12/14/18 0828      Core Components/Risk Factors/Patient Goals Review   Personal Goals Review  Weight Management/Obesity;Lipids;Hypertension    Review  Pt with multiple CAD RFs willing to participate in CR exercise.  Andrew Mitchell would ike to get back to walking 5 miles a day and keep up an exercise routine.     Expected Outcomes  Pt will continue to participate in CR exercise,  nutrition, and lifestyle modification opportunities.        ITP Comments: ITP Comments    Row Name 12/10/18 1009 12/14/18 0808         ITP Comments  Dr. Fransico Him, Medical Director   30 Day ITP Review. Pt started exercise today and tolerated it well.          Comments: See ITP Comments.

## 2018-12-18 ENCOUNTER — Encounter (HOSPITAL_COMMUNITY): Payer: 59

## 2018-12-19 ENCOUNTER — Other Ambulatory Visit: Payer: Self-pay

## 2018-12-21 ENCOUNTER — Encounter (HOSPITAL_COMMUNITY)
Admission: RE | Admit: 2018-12-21 | Discharge: 2018-12-21 | Disposition: A | Discharge status: Home / Self Care | Payer: 59 | Source: Ambulatory Visit | Attending: Interventional Cardiology | Admitting: Interventional Cardiology

## 2018-12-21 DIAGNOSIS — Z955 Presence of coronary angioplasty implant and graft: Secondary | ICD-10-CM

## 2018-12-21 DIAGNOSIS — I214 Non-ST elevation (NSTEMI) myocardial infarction: Secondary | ICD-10-CM | POA: Diagnosis not present

## 2018-12-21 MED ORDER — EZETIMIBE 10 MG PO TABS: 10.0000 mg | ORAL_TABLET | Freq: Every day | ORAL | 3 refills | Status: DC

## 2018-12-23 ENCOUNTER — Encounter (HOSPITAL_COMMUNITY): Payer: 59

## 2018-12-24 NOTE — Progress Notes (Signed)
Cardiology Office Note:    Date:  12/25/2018   ID:  Andrew Mitchell, Andrew Mitchell 04-29-1961, MRN [id number]  PCP:  [name], MD  Cardiologist:  Andrew Grooms, MD   Referring MD: [facility]   Chief Complaint  Patient presents with  . Coronary Artery Disease    History of Present Illness:    Andrew Mitchell is a 57 y.o. male with a hx of DOE, family h/o CAD (father with MI) , hyperlipidemia,  inferior MI 2019 treated with RCA DES.  He is doing well.  No angina, dyspnea, lower extremity swelling, palpitations, or syncope.  Planing of significant ED.  Patient side effects.  Rosuvastatin 40 mg/day led to elevated liver enzymes.  We asked him to discontinue the medication on February 21.  Liver tests and LDL cholesterol be restudied.  Past Medical History:  Diagnosis Date  . Back pain   . CAD (coronary artery disease)    a. 09/2018 Inf STEMI/PCI: LM nl, LAD min irregs, D1/2 min irregs, LCX 100ost/p thrombotic (2.75x35 Orsiro DES), RCA 40d, RPAV 90.  . Carpal tunnel syndrome   . DDD (degenerative disc disease)   . Diastolic dysfunction    a. 09/2018 Echo: EF 50-55%, prob inflat and antlat HK, Gr1 DD. Mildlly dil Ao root (46m). Nl RV fxn.  . Hyperlipemia   . Mildly Dilated aortic root ([place])    a. 09/2018 Echo: 49mm.  . 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 CARPAL TUNNEL RELEASE Left 01/06/2013   Procedure: CARPAL TUNNEL RELEASE;  Surgeon: Wynonia Sours, MD;  Location: [facility];  Service: Orthopedics;  Laterality: Left;  ANESTHESIA: IV REGIONAL FAB  . COLONOSCOPY    . CORONARY ANGIOGRAPHY N/A 10/08/2018   Procedure: CORONARY ANGIOGRAPHY;  Surgeon: Sherren Mocha, MD;  Location: [facility];  Service: Cardiovascular;  Laterality: N/A;  . CORONARY STENT INTERVENTION N/A 10/08/2018   Procedure: CORONARY STENT INTERVENTION;  Surgeon: Sherren Mocha, MD;  Location: [facility];   Service: Cardiovascular;  Laterality: N/A;  . CORONARY/GRAFT ACUTE MI REVASCULARIZATION N/A 10/08/2018   Procedure: Coronary/Graft Acute MI Revascularization;  Surgeon: Sherren Mocha, MD;  Location: [facility];  Service: Cardiovascular;  Laterality: N/A;  . KNEE ARTHROSCOPY     rightx2  . RIB RESECTION  2003   thorasic outlet syndrome-rt  . SHOULDER ARTHROSCOPY     rightx2  . TONSILLECTOMY      Current Medications: Current Meds  Medication Sig  . aspirin 81 MG tablet Take 81 mg by mouth daily.  . carbidopa-levodopa (SINEMET IR) 25-100 MG per tablet Take 1/2 to 1 tablet by mouth daily before bedtime.  Marland Kitchen ezetimibe (ZETIA) 10 MG tablet Take 1 tablet (10 mg total) by mouth daily. Please make overdue appt with Dr. Tamala Julian before anymore refills. 2nd attempt  . Krill Oil 1000 MG CAPS Take 1,000 mg by mouth daily.  Marland Kitchen lisdexamfetamine (VYVANSE) 60 MG capsule Take 60 mg by mouth every morning.   . metoprolol tartrate (LOPRESSOR) 25 MG tablet Take 1 tablet (25 mg total) by mouth 2 (two) times daily.  . Multiple Vitamins-Minerals (CENTRUM PO) Take 1 tablet by mouth daily.  . nitroGLYCERIN (NITROSTAT) 0.4 MG SL tablet Place 1 tablet (0.4 mg total) under the tongue every 5 (five) minutes x 3 doses as needed for chest pain.  . saw palmetto 160 MG capsule Take 320 mg  by mouth daily.  . ticagrelor (BRILINTA) 90 MG TABS tablet Take 1 tablet (90 mg total) by mouth 2 (two) times daily.  . vitamin B-12 (CYANOCOBALAMIN) 1000 MCG tablet Take 3,000 mcg by mouth daily.   . vitamin C (ASCORBIC ACID) 500 MG tablet Take 500 mg by mouth daily.     Allergies:   Shellfish allergy   Social History   Socioeconomic History  . Marital status: Married    Spouse name: Not on file  . Number of children: Not on file  . Years of education: Not on file  . Highest education level: Not on file  Occupational History  . Not on file  Social Needs  . Financial resource strain: Not on file  . Food insecurity:     Worry: Not on file    Inability: Not on file  . Transportation needs:    Medical: Not on file    Non-medical: Not on file  Tobacco Use  . Smoking status: Never Smoker  . Smokeless tobacco: Never Used  Substance and Sexual Activity  . Alcohol use: Yes    Comment: occ  . Drug use: No  . Sexual activity: Not on file  Lifestyle  . Physical activity:    Days per week: Not on file    Minutes per session: Not on file  . Stress: Not on file  Relationships  . Social connections:    Talks on phone: Not on file    Gets together: Not on file    Attends religious service: Not on file    Active member of club or organization: Not on file    Attends meetings of clubs or organizations: Not on file    Relationship status: Not on file  Other Topics Concern  . Not on file  Social History Narrative  . Not on file     Family History: The patient's family history is not on file.  ROS:   Please see the history of present illness.    Erectile dysfunction all other systems reviewed and are negative.  EKGs/Labs/Other Studies Reviewed:    The following studies were reviewed today: No new data  EKG:  EKG not repeated  Recent Labs: 10/09/2018: BUN 16; Creatinine, Ser 0.96; Hemoglobin 15.3; Platelets 236; Potassium 3.7; Sodium 137 12/01/2018: ALT 111  Recent Lipid Panel    Component Value Date/Time   CHOL 72 (L) 12/01/2018 0742   TRIG 50 12/01/2018 0742   HDL 39 (L) 12/01/2018 0742   CHOLHDL 1.8 12/01/2018 0742   CHOLHDL 3.3 10/09/2018 0028   VLDL 7 10/09/2018 0028   LDLCALC 23 12/01/2018 0742    Physical Exam:    VS:  BP 116/72   Pulse 62   Ht 5' 9.75" (1.772 m)   Wt 184 lb 12.8 oz (83.8 kg)   SpO2 98%   BMI 26.71 kg/m     Wt Readings from Last 3 Encounters:  12/25/18 184 lb 12.8 oz (83.8 kg)  12/10/18 188 lb 0.8 oz (85.3 kg)  10/23/18 187 lb 12.8 oz (85.2 kg)     GEN: Healthy appearing. No acute distress HEENT: Normal NECK: No JVD. LYMPHATICS: No lymphadenopathy  CARDIAC: RRR.  No murmur, no gallop, no edema VASCULAR: 2+ bilateral radial and carotid pulses, no bruits RESPIRATORY:  Clear to auscultation without rales, wheezing or rhonchi  ABDOMEN: Soft, non-tender, non-distended, No pulsatile mass, MUSCULOSKELETAL: No deformity  SKIN: Warm and dry NEUROLOGIC:  Alert and oriented x 3 PSYCHIATRIC:  Normal affect  ASSESSMENT:    1. Coronary artery disease involving native coronary artery of native heart with unstable angina pectoris ([place])   2. Hyperlipidemia, unspecified hyperlipidemia type   3. Essential hypertension   4. DOE (dyspnea on exertion)   5. Erectile dysfunction, unspecified erectile dysfunction type    PLAN:    In order of problems listed above:  1. Stable with moderate nonobstructive disease in the RCA and LAD.  Circumflex stent was placed with good result.  Secondary risk factor modification discussed in detail. 2. Not currently on statin therapy because he had elevated liver enzymes on high intensity rosuvastatin.  Recheck blood work in 2 weeks and make a decision concerning statin therapy.  Will probably use low to intermediate intensity and restudy. 3. Target blood pressure 130/80 mmHg or less. 4. No dyspnea on exertion currently. 5. He will be eligible for up PDE 5 inhibitor therapy.  He currently is trying over-the-counter arginine which I am okay with.  Clinical follow-up in 6 months.  Okay with PDE 5 inhibitor therapy.  Liver and lipid panel within the next 3 months to inform dose of statin that will be used.   Medication Adjustments/Labs and Tests Ordered: Current medicines are reviewed at length with the patient today.  Concerns regarding medicines are outlined above.  Orders Placed This Encounter  Procedures  . Hepatic function panel  . Lipid panel   No orders of the defined types were placed in this encounter.   Patient Instructions  Medication Instructions:  Your physician recommends that you continue on  your current medications as directed. Please refer to the Current Medication list given to you today.  If you need a refill on your cardiac medications before your next appointment, please call your pharmacy.   Lab work: Your physician recommends that you return for lab work in: 2 weeks (Lipid, liver). Please make sure you are fasting for these labs (nothing to eat or drink after midnight except water and black coffee).  If you have labs (blood work) drawn today and your tests are completely normal, you will receive your results only by: Marland Kitchen MyChart Message (if you have MyChart) OR . A paper copy in the mail If you have any lab test that is abnormal or we need to change your treatment, we will call you to review the results.  Testing/Procedures: None  Follow-Up: At [facility], you and your health needs are our priority.  As part of our continuing mission to provide you with exceptional heart care, we have created designated Provider Care Teams.  These Care Teams include your primary Cardiologist (physician) and Advanced Practice Providers (APPs -  Physician Assistants and Nurse Practitioners) who all work together to provide you with the care you need, when you need it. You will need a follow up appointment in 6 months.  Please call our office 2 months in advance to schedule this appointment.  You may see Andrew Grooms, MD or one of the following Advanced Practice Providers on your designated Care Team:   Truitt Merle, NP Cecilie Kicks, NP . Kathyrn Drown, NP  Any Other Special Instructions Will Be Listed Below (If Applicable).       Signed, Andrew Grooms, MD  12/25/2018 9:15 AM    [place]

## 2018-12-25 ENCOUNTER — Encounter: Payer: Self-pay | Admitting: Interventional Cardiology

## 2018-12-25 ENCOUNTER — Ambulatory Visit: Payer: 59 | Admitting: Interventional Cardiology

## 2018-12-25 ENCOUNTER — Encounter (HOSPITAL_COMMUNITY)
Admission: RE | Admit: 2018-12-25 | Discharge: 2018-12-25 | Disposition: A | Discharge status: Home / Self Care | Payer: 59 | Source: Ambulatory Visit | Attending: Interventional Cardiology | Admitting: Interventional Cardiology

## 2018-12-25 ENCOUNTER — Other Ambulatory Visit: Payer: Self-pay

## 2018-12-25 VITALS — BP 116/72 | HR 62 | Ht 69.75 in | Wt 184.8 lb

## 2018-12-25 DIAGNOSIS — E785 Hyperlipidemia, unspecified: Secondary | ICD-10-CM | POA: Diagnosis not present

## 2018-12-25 DIAGNOSIS — I10 Essential (primary) hypertension: Secondary | ICD-10-CM

## 2018-12-25 DIAGNOSIS — R0609 Other forms of dyspnea: Secondary | ICD-10-CM | POA: Diagnosis not present

## 2018-12-25 DIAGNOSIS — I214 Non-ST elevation (NSTEMI) myocardial infarction: Secondary | ICD-10-CM

## 2018-12-25 DIAGNOSIS — N529 Male erectile dysfunction, unspecified: Secondary | ICD-10-CM

## 2018-12-25 DIAGNOSIS — I25110 Atherosclerotic heart disease of native coronary artery with unstable angina pectoris: Secondary | ICD-10-CM

## 2018-12-25 DIAGNOSIS — Z955 Presence of coronary angioplasty implant and graft: Secondary | ICD-10-CM

## 2018-12-25 NOTE — Patient Instructions (Signed)
Medication Instructions:  Your physician recommends that you continue on your current medications as directed. Please refer to the Current Medication list given to you today.  If you need a refill on your cardiac medications before your next appointment, please call your pharmacy.   Lab work: Your physician recommends that you return for lab work in: 2 weeks (Lipid, liver). Please make sure you are fasting for these labs (nothing to eat or drink after midnight except water and black coffee).  If you have labs (blood work) drawn today and your tests are completely normal, you will receive your results only by: Marland Kitchen MyChart Message (if you have MyChart) OR . A paper copy in the mail If you have any lab test that is abnormal or we need to change your treatment, we will call you to review the results.  Testing/Procedures: None  Follow-Up: At [facility], you and your health needs are our priority.  As part of our continuing mission to provide you with exceptional heart care, we have created designated Provider Care Teams.  These Care Teams include your primary Cardiologist (physician) and Advanced Practice Providers (APPs -  Physician Assistants and Nurse Practitioners) who all work together to provide you with the care you need, when you need it. You will need a follow up appointment in 6 months.  Please call our office 2 months in advance to schedule this appointment.  You may see Sinclair Grooms, MD or one of the following Advanced Practice Providers on your designated Care Team:   Truitt Merle, NP Cecilie Kicks, NP . Kathyrn Drown, NP  Any Other Special Instructions Will Be Listed Below (If Applicable).

## 2018-12-28 ENCOUNTER — Encounter (HOSPITAL_COMMUNITY)
Admission: RE | Admit: 2018-12-28 | Discharge: 2018-12-28 | Disposition: A | Discharge status: Home / Self Care | Payer: 59 | Source: Ambulatory Visit | Attending: Interventional Cardiology | Admitting: Interventional Cardiology

## 2018-12-28 ENCOUNTER — Other Ambulatory Visit: Payer: Self-pay

## 2018-12-28 DIAGNOSIS — I214 Non-ST elevation (NSTEMI) myocardial infarction: Secondary | ICD-10-CM | POA: Diagnosis not present

## 2018-12-30 ENCOUNTER — Encounter (HOSPITAL_COMMUNITY): Payer: 59

## 2019-01-01 ENCOUNTER — Encounter (HOSPITAL_COMMUNITY): Payer: 59

## 2019-01-04 ENCOUNTER — Encounter (HOSPITAL_COMMUNITY): Payer: 59

## 2019-01-04 ENCOUNTER — Telehealth (HOSPITAL_COMMUNITY): Payer: Self-pay | Admitting: *Deleted

## 2019-01-04 ENCOUNTER — Other Ambulatory Visit: Payer: Self-pay

## 2019-01-04 MED ORDER — NITROGLYCERIN 0.4 MG SL SUBL: 0.4000 mg | SUBLINGUAL_TABLET | SUBLINGUAL | 3 refills | Status: DC | PRN

## 2019-01-04 NOTE — Telephone Encounter (Signed)
Pt called and updated about extended closure of cardiac rehab for four weeks d/t CoVID-19.  Tentative reopen date of 02/01/2019.  No answer. VM left for patient.

## 2019-01-05 ENCOUNTER — Telehealth (HOSPITAL_COMMUNITY): Payer: Self-pay

## 2019-01-05 NOTE — Telephone Encounter (Signed)
Andrew Mitchell 58 y.o. male Nutrition Note Spoke with pt vis telephone for 30 minute nutrition consult and education. Nutrition Plan and Nutrition Survey goals reviewed with pt. Pt is following a Heart Healthy diet. Pt continues with the desire to lose wt, ~5-10 lbs. Pt has not been trying to lose wt. Heart healthy eating tips and weight loss tips reviewed today (label reading, how to build a healthy plate, portion sizes, eating frequently across the day). Pt reports his main barrier currently is eating out 5x a week for lunch. Encouraged pt to decrease meals eaten away from home. Discussed batch cooking and meal prep and planning ahead of time as a strategy to help with this. Per discussion, pt does not use canned/convenience foods often. Pt does not add salt to food. Pt does eat out frequently. Pt expressed understanding of the information reviewed. Pt aware of nutrition education classes offered and would like to attend when cardiac rehab resumes.  Lab Results  Component Value Date   HGBA1C 5.5 12/01/2018    Wt Readings from Last 3 Encounters:  12/25/18 184 lb 12.8 oz (83.8 kg)  12/10/18 188 lb 0.8 oz (85.3 kg)  10/23/18 187 lb 12.8 oz (85.2 kg)    Nutrition Diagnosis ? Food-and nutrition-related knowledge deficit related to lack of exposure to information as related to diagnosis of: ? CVD ?  Overweight  related to excessive energy intake as evidenced by a BMI 27.18  Nutrition Intervention ? Pt's individual nutrition plan reviewed with pt. ? Benefits of adopting Heart Healthy diet discussed when Med[employer] reviewed.   ? Pt given handouts for: ? Label reading ? Building a healthy plate  ? Heart healthy nutrition  Goal(s) ? Pt to identify and limit food sources of saturated fat, trans fat, refined carbohydrates and sodium ? Pt to identify food quantities necessary to achieve weight loss of 6-24 lb at graduation from cardiac rehab.  ? Pt to build a healthy plate including vegetables,  fruits, whole grains, and low-fat dairy products in a heart healthy meal plan. ? Pt to decrease consumption of fried foods  Plan:   Pt to attend nutrition classes ? Nutrition I ? Nutrition II ? Portion Distortion   Will provide client-centered nutrition education as part of interdisciplinary care  Monitor and evaluate progress toward nutrition goal with team.    Laurina Bustle, MS, RD, LDN 01/05/2019 11:15 AM

## 2019-01-06 ENCOUNTER — Encounter (HOSPITAL_COMMUNITY): Payer: 59

## 2019-01-06 NOTE — Progress Notes (Signed)
Cardiac Individual Treatment Plan  Patient Details  Name: Andrew Mitchell MRN: [id number] Date of Birth: 10/24/1960 Referring Provider:     CARDIAC REHAB PHASE II ORIENTATION from 12/10/2018 in [place]  Referring Provider  Belva Crome MD       Initial Encounter Date:    CARDIAC REHAB PHASE II ORIENTATION from 12/10/2018 in [place]  Date  12/10/18      Visit Diagnosis: NSTEMI (non-ST elevated myocardial infarction) [facility])  Status post coronary artery stent placement  Patient's Home Medications on Admission:  Current Outpatient Medications:  .  aspirin 81 MG tablet, Take 81 mg by mouth daily., Disp: , Rfl:  .  carbidopa-levodopa (SINEMET IR) 25-100 MG per tablet, Take 1/2 to 1 tablet by mouth daily before bedtime., Disp: , Rfl:  .  ezetimibe (ZETIA) 10 MG tablet, Take 1 tablet (10 mg total) by mouth daily. Please make overdue appt with Dr. Tamala Julian before anymore refills. 2nd attempt, Disp: 90 tablet, Rfl: 3 .  Krill Oil 1000 MG CAPS, Take 1,000 mg by mouth daily., Disp: , Rfl:  .  lisdexamfetamine (VYVANSE) 60 MG capsule, Take 60 mg by mouth every morning. , Disp: , Rfl:  .  metoprolol tartrate (LOPRESSOR) 25 MG tablet, Take 1 tablet (25 mg total) by mouth 2 (two) times daily., Disp: 60 tablet, Rfl: 6 .  Multiple Vitamins-Minerals (CENTRUM PO), Take 1 tablet by mouth daily., Disp: , Rfl:  .  nitroGLYCERIN (NITROSTAT) 0.4 MG SL tablet, Place 1 tablet (0.4 mg total) under the tongue every 5 (five) minutes x 3 doses as needed for chest pain., Disp: 25 tablet, Rfl: 3 .  saw palmetto 160 MG capsule, Take 320 mg by mouth daily., Disp: , Rfl:  .  ticagrelor (BRILINTA) 90 MG TABS tablet, Take 1 tablet (90 mg total) by mouth 2 (two) times daily., Disp: 60 tablet, Rfl: 6 .  vitamin B-12 (CYANOCOBALAMIN) 1000 MCG tablet, Take 3,000 mcg by mouth daily. , Disp: , Rfl:  .  vitamin C (ASCORBIC ACID) 500 MG tablet, Take 500 mg by  mouth daily., Disp: , Rfl:   Past Medical History: Past Medical History:  Diagnosis Date  . Back pain   . CAD (coronary artery disease)    a. 09/2018 Inf STEMI/PCI: LM nl, LAD min irregs, D1/2 min irregs, LCX 100ost/p thrombotic (2.75x35 Orsiro DES), RCA 40d, RPAV 90.  . Carpal tunnel syndrome   . DDD (degenerative disc disease)   . Diastolic dysfunction    a. 09/2018 Echo: EF 50-55%, prob inflat and antlat HK, Gr1 DD. Mildlly dil Ao root (25m). Nl RV fxn.  . Hyperlipemia   . Mildly Dilated aortic root ([place])    a. 09/2018 Echo: 72mm.  . Tubular adenocarcinoma ([place]) 2010   colon    Tobacco Use: Social History   Tobacco Use  Smoking Status Never Smoker  Smokeless Tobacco Never Used    Labs: Recent Review Flowsheet Data    Labs for ITP Cardiac and Pulmonary Rehab Latest Ref Rng & Units 10/01/2017 02/06/2018 10/08/2018 10/09/2018 12/01/2018   Cholestrol 100 - 199 mg/dL 187 122 155 126 72(L)   LDLCALC 0 - 99 mg/dL 112(H) 62 76 81 23   HDL >39 mg/dL 38(L) 44 43 38(L) 39(L)   Trlycerides 0 - 149 mg/dL 186(H) 79 182(H) 36 50   Hemoglobin A1c 4.8 - 5.6 % - - - - 5.5   TCO2 22 - 32 mmol/L - -  26 - -      Capillary Blood Glucose: No results found for: GLUCAP   Exercise Target Goals: Exercise Program Goal: Individual exercise prescription set using results from initial 6 min walk test and THRR while considering  patient's activity barriers and safety.   Exercise Prescription Goal: Initial exercise prescription builds to 30-45 minutes a day of aerobic activity, 2-3 days per week.  Home exercise guidelines will be given to patient during program as part of exercise prescription that the participant will acknowledge.  Activity Barriers & Risk Stratification: Activity Barriers & Cardiac Risk Stratification - 12/10/18 1018      Activity Barriers & Cardiac Risk Stratification   Activity Barriers  None    Cardiac Risk Stratification  High       6 Minute Walk: 6 Minute Walk     Row Name 12/10/18 1016         6 Minute Walk   Phase  Initial     Distance  1946 feet     Walk Time  6 minutes     # of Rest Breaks  0     MPH  3.69     METS  4.53     RPE  11     Perceived Dyspnea   0     VO2 Peak  15.84     Symptoms  No     Resting HR  65 bpm     Resting BP  118/70     Resting Oxygen Saturation   98 %     Exercise Oxygen Saturation  during 6 min walk  99 %     Max Ex. HR  83 bpm     Max Ex. BP  122/78     2 Minute Post BP  124/72        Oxygen Initial Assessment:   Oxygen Re-Evaluation:   Oxygen Discharge (Final Oxygen Re-Evaluation):   Initial Exercise Prescription: Initial Exercise Prescription - 12/10/18 1000      Date of Initial Exercise RX and Referring Provider   Date  12/10/18    Referring Provider  Belva Crome MD     Expected Discharge Date  03/17/19      Treadmill   MPH  3    Grade  1    Minutes  10    METs  3.71      Bike   Level  1    Minutes  10    METs  3.22      NuStep   Level  2    SPM  85    Minutes  10    METs  3      Prescription Details   Frequency (times per week)  3x    Duration  Progress to 30 minutes of continuous aerobic without signs/symptoms of physical distress      Intensity   THRR 40-80% of Max Heartrate  65-130    Ratings of Perceived Exertion  11-13    Perceived Dyspnea  0-4      Progression   Progression  Continue progressive overload as per policy without signs/symptoms or physical distress.      Resistance Training   Training Prescription  Yes    Weight  5lbs    Reps  10-15       Perform Capillary Blood Glucose checks as needed.  Exercise Prescription Changes: Exercise Prescription Changes    Row Name 12/14/18 0800 12/28/18 1327  Response to Exercise   Blood Pressure (Admit)  120/80  126/72      Blood Pressure (Exercise)  134/80  144/78      Blood Pressure (Exit)  110/72  122/72      Heart Rate (Admit)  77 bpm  79 bpm      Heart Rate (Exercise)  103 bpm  121 bpm       Heart Rate (Exit)  76 bpm  83 bpm      Rating of Perceived Exertion (Exercise)  11  12      Perceived Dyspnea (Exercise)  0  0      Symptoms  None  None      Comments  Pt oriented to exercise equipment   None      Duration  Progress to 30 minutes of  aerobic without signs/symptoms of physical distress  Progress to 30 minutes of  aerobic without signs/symptoms of physical distress      Intensity  THRR unchanged  THRR unchanged        Progression   Progression  Continue to progress workloads to maintain intensity without signs/symptoms of physical distress.  Continue to progress workloads to maintain intensity without signs/symptoms of physical distress.      Average METs  3.38  4.2        Resistance Training   Training Prescription  Yes  Yes      Weight  5lbs  5lbs      Reps  10-15  10-15      Time  10 Minutes  10 Minutes        Treadmill   MPH  3  3.4      Grade  1  2      Minutes  10  10      METs  3.71  4.54        Bike   Level  1  1      Minutes  10  10      METs  3.23  3.23        NuStep   Level  2  3      SPM  95  105      Minutes  10  10      METs  3.2  4.7         Exercise Comments: Exercise Comments    Row Name 12/14/18 0807 12/31/18 1329         Exercise Comments  Pt's first day of exercise. Pt oriented to exercise equipment. Pt responded well to workloads. Will continue to monitor and progress pt as tolerated.   Pt has been responding well to exercise prescription. Unable to review METs, Goals and HEP with pt due to departmental closure. Closure due to CDC recommendation to help prevent COVID-19 spread.          Exercise Goals and Review: Exercise Goals    Row Name 12/10/18 1018             Exercise Goals   Increase Physical Activity  Yes       Intervention  Provide advice, education, support and counseling about physical activity/exercise needs.;Develop an individualized exercise prescription for aerobic and resistive training based on initial  evaluation findings, risk stratification, comorbidities and participant's personal goals.       Expected Outcomes  Short Term: Attend rehab on a regular basis to increase amount of physical activity.;Long Term: Add in home exercise to make exercise part  of routine and to increase amount of physical activity.;Long Term: Exercising regularly at least 3-5 days a week.       Increase Strength and Stamina  Yes       Intervention  Provide advice, education, support and counseling about physical activity/exercise needs.;Develop an individualized exercise prescription for aerobic and resistive training based on initial evaluation findings, risk stratification, comorbidities and participant's personal goals.       Expected Outcomes  Short Term: Increase workloads from initial exercise prescription for resistance, speed, and METs.;Short Term: Perform resistance training exercises routinely during rehab and add in resistance training at home;Long Term: Improve cardiorespiratory fitness, muscular endurance and strength as measured by increased METs and functional capacity (6MWT)       Able to understand and use rate of perceived exertion (RPE) scale  Yes       Intervention  Provide education and explanation on how to use RPE scale       Expected Outcomes  Short Term: Able to use RPE daily in rehab to express subjective intensity level;Long Term:  Able to use RPE to guide intensity level when exercising independently       Knowledge and understanding of Target Heart Rate Range (THRR)  Yes       Intervention  Provide education and explanation of THRR including how the numbers were predicted and where they are located for reference       Expected Outcomes  Short Term: Able to state/look up THRR;Short Term: Able to use daily as guideline for intensity in rehab;Long Term: Able to use THRR to govern intensity when exercising independently       Able to check pulse independently  Yes       Intervention  Provide education  and demonstration on how to check pulse in carotid and radial arteries.;Review the importance of being able to check your own pulse for safety during independent exercise       Expected Outcomes  Short Term: Able to explain why pulse checking is important during independent exercise;Long Term: Able to check pulse independently and accurately       Understanding of Exercise Prescription  Yes       Intervention  Provide education, explanation, and written materials on patient's individual exercise prescription       Expected Outcomes  Short Term: Able to explain program exercise prescription;Long Term: Able to explain home exercise prescription to exercise independently          Exercise Goals Re-Evaluation :   Discharge Exercise Prescription (Final Exercise Prescription Changes): Exercise Prescription Changes - 12/28/18 1327      Response to Exercise   Blood Pressure (Admit)  126/72    Blood Pressure (Exercise)  144/78    Blood Pressure (Exit)  122/72    Heart Rate (Admit)  79 bpm    Heart Rate (Exercise)  121 bpm    Heart Rate (Exit)  83 bpm    Rating of Perceived Exertion (Exercise)  12    Perceived Dyspnea (Exercise)  0    Symptoms  None    Comments  None    Duration  Progress to 30 minutes of  aerobic without signs/symptoms of physical distress    Intensity  THRR unchanged      Progression   Progression  Continue to progress workloads to maintain intensity without signs/symptoms of physical distress.    Average METs  4.2      Resistance Training   Training Prescription  Yes  Weight  5lbs    Reps  10-15    Time  10 Minutes      Treadmill   MPH  3.4    Grade  2    Minutes  10    METs  4.54      Bike   Level  1    Minutes  10    METs  3.23      NuStep   Level  3    SPM  105    Minutes  10    METs  4.7       Nutrition:  Target Goals: Understanding of nutrition guidelines, daily intake of sodium 1500mg , cholesterol 200mg , calories 30% from fat and 7% or  less from saturated fats, daily to have 5 or more servings of fruits and vegetables.  Biometrics: Pre Biometrics - 12/10/18 1017      Pre Biometrics   Height  5' 9.75" (1.772 m)    Weight  85.3 kg    Waist Circumference  37 inches    Hip Circumference  41 inches    Waist to Hip Ratio  0.9 %    BMI (Calculated)  27.17    Triceps Skinfold  20 mm    % Body Fat  26.6 %    Grip Strength  42 kg    Flexibility  13 in    Single Leg Stand  30 seconds        Nutrition Therapy Plan and Nutrition Goals: Nutrition Therapy & Goals - 12/11/18 0835      Nutrition Therapy   Diet  Heart healthy      Personal Nutrition Goals   Nutrition Goal  Pt to identify and limit food sources of saturated fat, trans fat, refined carbohydrates and sodium    Personal Goal #2  Pt to identify food quantities necessary to achieve weight loss of 6-24 lb at graduation from cardiac rehab    Personal Goal #3  Pt to build a healthy plate including vegetables, fruits, whole grains, and low-fat dairy products in a heart healthy meal plan    Personal Goal #4  Pt to decrease consumption of fried foods      Intervention Plan   Intervention  Prescribe, educate and counsel regarding individualized specific dietary modifications aiming towards targeted core components such as weight, hypertension, lipid management, diabetes, heart failure and other comorbidities.    Expected Outcomes  Short Term Goal: Understand basic principles of dietary content, such as calories, fat, sodium, cholesterol and nutrients.;Long Term Goal: Adherence to prescribed nutrition plan.       Nutrition Assessments: Nutrition Assessments - 12/11/18 0838      MEDFICTS Scores   Pre Score  26       Nutrition Goals Re-Evaluation: Nutrition Goals Re-Evaluation    [place] Name 12/11/18 0835             Goals   Current Weight  188 lb 0.8 oz (85.3 kg)          Nutrition Goals Re-Evaluation: Nutrition Goals Re-Evaluation    [place] Name 12/11/18  0835             Goals   Current Weight  188 lb 0.8 oz (85.3 kg)          Nutrition Goals Discharge (Final Nutrition Goals Re-Evaluation): Nutrition Goals Re-Evaluation - 12/11/18 0835      Goals   Current Weight  188 lb 0.8 oz (85.3 kg)  Psychosocial: Target Goals: Acknowledge presence or absence of significant depression and/or stress, maximize coping skills, provide positive support system. Participant is able to verbalize types and ability to use techniques and skills needed for reducing stress and depression.  Initial Review & Psychosocial Screening: Initial Psych Review & Screening - 12/10/18 0752      Initial Review   Current issues with  None Identified      Family Dynamics   Good Support System?  Yes   Pt lists his wife, sons, friends, and extended family as sources of support.      Barriers   Psychosocial barriers to participate in program  There are no identifiable barriers or psychosocial needs.      Screening Interventions   Interventions  Encouraged to exercise       Quality of Life Scores: Quality of Life - 12/10/18 0801      Quality of Life   Select  Quality of Life      Quality of Life Scores   Health/Function Pre  23.77 %    Socioeconomic Pre  24.75 %    Psych/Spiritual Pre  25.5 %    Family Pre  25.2 %    GLOBAL Pre  24.54 %      Scores of 19 and below usually indicate a poorer quality of life in these areas.  A difference of  2-3 points is a clinically meaningful difference.  A difference of 2-3 points in the total score of the Quality of Life Index has been associated with significant improvement in overall quality of life, self-image, physical symptoms, and general health in studies assessing change in quality of life.  PHQ-9: Recent Review Flowsheet Data    Depression screen [facility] 2/9 12/14/2018   Decreased Interest 0   Down, Depressed, Hopeless 0   PHQ - 2 Score 0     Interpretation of Total Score  Total Score Depression  Severity:  1-4 = Minimal depression, 5-9 = Mild depression, 10-14 = Moderate depression, 15-19 = Moderately severe depression, 20-27 = Severe depression   Psychosocial Evaluation and Intervention: Psychosocial Evaluation - 12/14/18 0809      Psychosocial Evaluation & Interventions   Interventions  Encouraged to exercise with the program and follow exercise prescription    Comments  No psychosocial needs identified.  Ulice Dash enjoys golfing, exercising, and yard work.     Expected Outcomes  Ulice Dash will maintain a positive outlook with good coping skills.    Continue Psychosocial Services   No Follow up required       Psychosocial Re-Evaluation: Psychosocial Re-Evaluation    [place] Name 12/30/18 1347             Psychosocial Re-Evaluation   Current issues with  None Identified       Comments  No psychosocial needs identified. No interventions necessary.       Expected Outcomes  Ulice Dash will maintained a positive outlook with good coping skills.        Interventions  Encouraged to attend Cardiac Rehabilitation for the exercise       Continue Psychosocial Services   No Follow up required          Psychosocial Discharge (Final Psychosocial Re-Evaluation): Psychosocial Re-Evaluation - 12/30/18 1347      Psychosocial Re-Evaluation   Current issues with  None Identified    Comments  No psychosocial needs identified. No interventions necessary.    Expected Outcomes  Ulice Dash will maintained a positive outlook with good coping skills.  Interventions  Encouraged to attend Cardiac Rehabilitation for the exercise    Continue Psychosocial Services   No Follow up required       Vocational Rehabilitation: Provide vocational rehab assistance to qualifying candidates.   Vocational Rehab Evaluation & Intervention: Vocational Rehab - 12/10/18 0258      Initial Vocational Rehab Evaluation & Intervention   Assessment shows need for Vocational Rehabilitation  No       Education: Education Goals:  Education classes will be provided on a weekly basis, covering required topics. Participant will state understanding/return demonstration of topics presented.  Learning Barriers/Preferences: Learning Barriers/Preferences - 12/10/18 1030      Learning Barriers/Preferences   Learning Barriers  Sight    Learning Preferences  Skilled Demonstration;Written Material;Individual Instruction       Education Topics: Count Your Pulse:  -Group instruction provided by verbal instruction, demonstration, patient participation and written materials to support subject.  Instructors address importance of being able to find your pulse and how to count your pulse when at home without a heart monitor.  Patients get hands on experience counting their pulse with staff help and individually.   Heart Attack, Angina, and Risk Factor Modification:  -Group instruction provided by verbal instruction, video, and written materials to support subject.  Instructors address signs and symptoms of angina and heart attacks.    Also discuss risk factors for heart disease and how to make changes to improve heart health risk factors.   Functional Fitness:  -Group instruction provided by verbal instruction, demonstration, patient participation, and written materials to support subject.  Instructors address safety measures for doing things around the house.  Discuss how to get up and down off the floor, how to pick things up properly, how to safely get out of a chair without assistance, and balance training.   Meditation and Mindfulness:  -Group instruction provided by verbal instruction, patient participation, and written materials to support subject.  Instructor addresses importance of mindfulness and meditation practice to help reduce stress and improve awareness.  Instructor also leads participants through a meditation exercise.    Stretching for Flexibility and Mobility:  -Group instruction provided by verbal instruction,  patient participation, and written materials to support subject.  Instructors lead participants through series of stretches that are designed to increase flexibility thus improving mobility.  These stretches are additional exercise for major muscle groups that are typically performed during regular warm up and cool down.   Hands Only CPR:  -Group verbal, video, and participation provides a basic overview of AHA guidelines for community CPR. Role-play of emergencies allow participants the opportunity to practice calling for help and chest compression technique with discussion of AED use.   Hypertension: -Group verbal and written instruction that provides a basic overview of hypertension including the most recent diagnostic guidelines, risk factor reduction with self-care instructions and medication management.    Nutrition I class: Heart Healthy Eating:  -Group instruction provided by PowerPoint slides, verbal discussion, and written materials to support subject matter. The instructor gives an explanation and review of the Therapeutic Lifestyle Changes diet recommendations, which includes a discussion on lipid goals, dietary fat, sodium, fiber, plant stanol/sterol esters, sugar, and the components of a well-balanced, healthy diet.   Nutrition II class: Lifestyle Skills:  -Group instruction provided by PowerPoint slides, verbal discussion, and written materials to support subject matter. The instructor gives an explanation and review of label reading, grocery shopping for heart health, heart healthy recipe modifications, and ways to make healthier  choices when eating out.   Diabetes Question & Answer:  -Group instruction provided by PowerPoint slides, verbal discussion, and written materials to support subject matter. The instructor gives an explanation and review of diabetes co-morbidities, pre- and post-prandial blood glucose goals, pre-exercise blood glucose goals, signs, symptoms, and  treatment of hypoglycemia and hyperglycemia, and foot care basics.   Diabetes Blitz:  -Group instruction provided by PowerPoint slides, verbal discussion, and written materials to support subject matter. The instructor gives an explanation and review of the physiology behind type 1 and type 2 diabetes, diabetes medications and rational behind using different medications, pre- and post-prandial blood glucose recommendations and Hemoglobin A1c goals, diabetes diet, and exercise including blood glucose guidelines for exercising safely.    Portion Distortion:  -Group instruction provided by PowerPoint slides, verbal discussion, written materials, and food models to support subject matter. The instructor gives an explanation of serving size versus portion size, changes in portions sizes over the last 20 years, and what consists of a serving from each food group.   Stress Management:  -Group instruction provided by verbal instruction, video, and written materials to support subject matter.  Instructors review role of stress in heart disease and how to cope with stress positively.     Exercising on Your Own:  -Group instruction provided by verbal instruction, power point, and written materials to support subject.  Instructors discuss benefits of exercise, components of exercise, frequency and intensity of exercise, and end points for exercise.  Also discuss use of nitroglycerin and activating EMS.  Review options of places to exercise outside of rehab.  Review guidelines for sex with heart disease.   Cardiac Drugs I:  -Group instruction provided by verbal instruction and written materials to support subject.  Instructor reviews cardiac drug classes: antiplatelets, anticoagulants, beta blockers, and statins.  Instructor discusses reasons, side effects, and lifestyle considerations for each drug class.   Cardiac Drugs II:  -Group instruction provided by verbal instruction and written materials to  support subject.  Instructor reviews cardiac drug classes: angiotensin converting enzyme inhibitors (ACE-I), angiotensin II receptor blockers (ARBs), nitrates, and calcium channel blockers.  Instructor discusses reasons, side effects, and lifestyle considerations for each drug class.   Anatomy and Physiology of the Circulatory System:  Group verbal and written instruction and models provide basic cardiac anatomy and physiology, with the coronary electrical and arterial systems. Review of: AMI, Angina, Valve disease, Heart Failure, Peripheral Artery Disease, Cardiac Arrhythmia, Pacemakers, and the ICD.   Other Education:  -Group or individual verbal, written, or video instructions that support the educational goals of the cardiac rehab program.   Holiday Eating Survival Tips:  -Group instruction provided by PowerPoint slides, verbal discussion, and written materials to support subject matter. The instructor gives patients tips, tricks, and techniques to help them not only survive but enjoy the holidays despite the onslaught of food that accompanies the holidays.   Knowledge Questionnaire Score: Knowledge Questionnaire Score - 12/10/18 0745      Knowledge Questionnaire Score   Pre Score  23/24       Core Components/Risk Factors/Patient Goals at Admission: Personal Goals and Risk Factors at Admission - 12/10/18 1034      Core Components/Risk Factors/Patient Goals on Admission    Weight Management  Yes;Weight Loss    Intervention  Weight Management: Develop a combined nutrition and exercise program designed to reach desired caloric intake, while maintaining appropriate intake of nutrient and fiber, sodium and fats, and appropriate energy expenditure required for  the weight goal.;Weight Management: Provide education and appropriate resources to help participant work on and attain dietary goals.;Weight Management/Obesity: Establish reasonable short term and long term weight goals.    Admit  Weight  188 lb 0.8 oz (85.3 kg)    Goal Weight: Long Term  178 lb (80.7 kg)    Expected Outcomes  Short Term: Continue to assess and modify interventions until short term weight is achieved;Long Term: Adherence to nutrition and physical activity/exercise program aimed toward attainment of established weight goal;Weight Loss: Understanding of general recommendations for a balanced deficit meal plan, which promotes 1-2 lb weight loss per week and includes a negative energy balance of [phone] kcal/d;Understanding recommendations for meals to include 15-35% energy as protein, 25-35% energy from fat, 35-60% energy from carbohydrates, less than 200mg  of dietary cholesterol, 20-35 gm of total fiber daily;Understanding of distribution of calorie intake throughout the day with the consumption of 4-5 meals/snacks    Hypertension  Yes    Intervention  Provide education on lifestyle modifcations including regular physical activity/exercise, weight management, moderate sodium restriction and increased consumption of fresh fruit, vegetables, and low fat dairy, alcohol moderation, and smoking cessation.;Monitor prescription use compliance.    Expected Outcomes  Short Term: Continued assessment and intervention until BP is < 140/78mm HG in hypertensive participants. < 130/43mm HG in hypertensive participants with diabetes, heart failure or chronic kidney disease.;Long Term: Maintenance of blood pressure at goal levels.    Lipids  Yes    Intervention  Provide education and support for participant on nutrition & aerobic/resistive exercise along with prescribed medications to achieve LDL 70mg , HDL >40mg .    Expected Outcomes  Short Term: Participant states understanding of desired cholesterol values and is compliant with medications prescribed. Participant is following exercise prescription and nutrition guidelines.;Long Term: Cholesterol controlled with medications as prescribed, with individualized exercise RX and with  personalized nutrition plan. Value goals: LDL < 70mg , HDL > 40 mg.       Core Components/Risk Factors/Patient Goals Review:  Goals and Risk Factor Review    Row Name 12/14/18 0828 12/30/18 1347           Core Components/Risk Factors/Patient Goals Review   Personal Goals Review  Weight Management/Obesity;Lipids;Hypertension  Weight Management/Obesity;Lipids;Hypertension      Review  Pt with multiple CAD RFs willing to participate in CR exercise.  Ulice Dash would ike to get back to walking 5 miles a day and keep up an exercise routine.   Pt continues to tolerate exercise well.  Exercise currently on hold as department is closed per recommended guidelines from federal government to prevent spread of COVID-19.       Expected Outcomes  Pt will continue to participate in CR exercise, nutrition, and lifestyle modification opportunities.   Pt will continue to participate in CR exercise, nutrition, and lifestyle modification opportunities.          Core Components/Risk Factors/Patient Goals at Discharge (Final Review):  Goals and Risk Factor Review - 12/30/18 1347      Core Components/Risk Factors/Patient Goals Review   Personal Goals Review  Weight Management/Obesity;Lipids;Hypertension    Review  Pt continues to tolerate exercise well.  Exercise currently on hold as department is closed per recommended guidelines from federal government to prevent spread of COVID-19.     Expected Outcomes  Pt will continue to participate in CR exercise, nutrition, and lifestyle modification opportunities.        ITP Comments: ITP Comments    Row Name 12/10/18 1009  12/14/18 0768 12/30/18 1333       ITP Comments  Dr. Fransico Him, Medical Director   30 Day ITP Review. Pt started exercise today and tolerated it well.   30 Day ITP Review. Pt continues to tolerate exercise well.  Exercise currently on hold as department is closed per recommended guidelines from federal government to prevent spread of COVID-19.          Comments: See ITP Comments.

## 2019-01-08 ENCOUNTER — Other Ambulatory Visit: Payer: 59

## 2019-01-08 ENCOUNTER — Encounter (HOSPITAL_COMMUNITY): Payer: 59

## 2019-01-11 ENCOUNTER — Encounter (HOSPITAL_COMMUNITY): Payer: 59

## 2019-01-13 ENCOUNTER — Encounter (HOSPITAL_COMMUNITY): Payer: 59

## 2019-01-15 ENCOUNTER — Encounter (HOSPITAL_COMMUNITY): Payer: 59

## 2019-01-18 ENCOUNTER — Encounter (HOSPITAL_COMMUNITY): Payer: 59

## 2019-01-20 ENCOUNTER — Encounter (HOSPITAL_COMMUNITY): Payer: 59

## 2019-01-20 ENCOUNTER — Telehealth (HOSPITAL_COMMUNITY): Payer: Self-pay | Admitting: Cardiac Rehabilitation

## 2019-01-20 NOTE — Telephone Encounter (Signed)
Pt phone call to advise of continued Outpatient  Cardiac Rehab departmental closure for COVID 19 precautions.  Unknown date for reopening. Pt advised to continue exercising on her own, following home exercise guidelines. Pt reports he is maintaining active lifestyle.   Pt instructed to notify MD PRN symptoms.  Pt denies food insecurity at this time.  Pt offered emotional support and reassurance.  Pt expressed gratitude for the call and is looking forward to returning to [facility] program upon reopening.    Andi Hence, RN, BSN Cardiac Pulmonary Rehab

## 2019-01-22 ENCOUNTER — Encounter (HOSPITAL_COMMUNITY): Payer: 59

## 2019-01-25 ENCOUNTER — Encounter (HOSPITAL_COMMUNITY): Payer: 59

## 2019-01-27 ENCOUNTER — Encounter (HOSPITAL_COMMUNITY): Payer: 59

## 2019-01-29 ENCOUNTER — Encounter (HOSPITAL_COMMUNITY): Payer: 59

## 2019-02-01 ENCOUNTER — Encounter (HOSPITAL_COMMUNITY): Payer: 59

## 2019-02-02 ENCOUNTER — Other Ambulatory Visit: Payer: Self-pay

## 2019-02-02 ENCOUNTER — Other Ambulatory Visit: Payer: 59

## 2019-02-02 DIAGNOSIS — I25110 Atherosclerotic heart disease of native coronary artery with unstable angina pectoris: Secondary | ICD-10-CM

## 2019-02-02 DIAGNOSIS — E785 Hyperlipidemia, unspecified: Secondary | ICD-10-CM

## 2019-02-02 LAB — LIPID PANEL
Chol/HDL Ratio: 3.7 ratio (ref 0.0–5.0)
Cholesterol, Total: 183 mg/dL (ref 100–199)
HDL: 49 mg/dL (ref >39)
LDL Calculated: 115 mg/dL — ABNORMAL HIGH (ref 0–99)
Triglycerides: 93 mg/dL (ref 0–149)
VLDL Cholesterol Cal: 19 mg/dL (ref 5–40)

## 2019-02-02 LAB — HEPATIC FUNCTION PANEL
ALT: 20 IU/L (ref 0–44)
AST: 21 IU/L (ref 0–40)
Albumin: 4.3 g/dL (ref 3.8–4.9)
Alkaline Phosphatase: 71 IU/L (ref 39–117)
Bilirubin Total: 0.5 mg/dL (ref 0.0–1.2)
Bilirubin, Direct: 0.14 mg/dL (ref 0.00–0.40)
Total Protein: 6.7 g/dL (ref 6.0–8.5)

## 2019-02-03 ENCOUNTER — Encounter (HOSPITAL_COMMUNITY): Payer: 59

## 2019-02-05 ENCOUNTER — Encounter (HOSPITAL_COMMUNITY): Payer: 59

## 2019-02-05 ENCOUNTER — Telehealth: Payer: Self-pay | Admitting: *Deleted

## 2019-02-05 ENCOUNTER — Encounter: Payer: Self-pay | Admitting: Interventional Cardiology

## 2019-02-05 DIAGNOSIS — E785 Hyperlipidemia, unspecified: Secondary | ICD-10-CM

## 2019-02-05 MED ORDER — ROSUVASTATIN CALCIUM 5 MG PO TABS: 5.0000 mg | ORAL_TABLET | Freq: Every day | ORAL | 3 refills | Status: DC

## 2019-02-05 NOTE — Telephone Encounter (Signed)
Error

## 2019-02-05 NOTE — Telephone Encounter (Signed)
Spoke with pt and went over results and recommendations per Dr. Tamala Julian.  Pt will have labs drawn 6/9.  Pt verbalized understanding and was in agreement with this plan.   Pt states he has plenty of 5mg  tablets let over from when he was previously on them.  He asked that I hold off on sending in prescription and he will call when he needs it filled.

## 2019-02-05 NOTE — Telephone Encounter (Signed)
-----   Message from Belva Crome, MD sent at 02/04/2019  2:42 PM EDT ----- Let the patient know liver enzymes are back to normal.  Start rosuvastatin 5 mg/day.  Liver and lipid in 6 weeks. A copy will be sent to [name], MD

## 2019-02-08 ENCOUNTER — Encounter (HOSPITAL_COMMUNITY): Payer: 59

## 2019-02-10 ENCOUNTER — Encounter (HOSPITAL_COMMUNITY): Payer: 59

## 2019-02-12 ENCOUNTER — Encounter (HOSPITAL_COMMUNITY): Payer: 59

## 2019-02-15 ENCOUNTER — Encounter (HOSPITAL_COMMUNITY): Payer: 59

## 2019-02-17 ENCOUNTER — Encounter (HOSPITAL_COMMUNITY): Payer: 59

## 2019-02-19 ENCOUNTER — Encounter (HOSPITAL_COMMUNITY): Payer: 59

## 2019-02-22 ENCOUNTER — Encounter (HOSPITAL_COMMUNITY): Payer: 59

## 2019-02-24 ENCOUNTER — Encounter (HOSPITAL_COMMUNITY): Payer: 59

## 2019-02-26 ENCOUNTER — Encounter (HOSPITAL_COMMUNITY): Payer: 59

## 2019-03-01 ENCOUNTER — Encounter (HOSPITAL_COMMUNITY): Payer: 59

## 2019-03-03 ENCOUNTER — Encounter (HOSPITAL_COMMUNITY): Payer: 59

## 2019-03-05 ENCOUNTER — Encounter (HOSPITAL_COMMUNITY): Payer: 59

## 2019-03-09 ENCOUNTER — Telehealth (HOSPITAL_COMMUNITY): Payer: Self-pay | Admitting: *Deleted

## 2019-03-09 NOTE — Telephone Encounter (Signed)
PC to patient regarding virtual cardiac rehab option while face to face cardiac rehab is closed.  Explained program to patient and pt expresses interest in joining program. Virtual Cardiac Rehab will be set up with patient.

## 2019-03-10 ENCOUNTER — Encounter (HOSPITAL_COMMUNITY): Payer: 59

## 2019-03-10 ENCOUNTER — Encounter (HOSPITAL_COMMUNITY): Payer: Self-pay

## 2019-03-10 NOTE — Progress Notes (Signed)
Transitioning  Dr.  Tamala Julian   As you are aware our department remains closed to patients due to Covid-19.  We are excited to be able to offer an alternative to traditional onsite Cardiac Rehab while your patient continues to follow Re-Open guidelines.  This is a notification that your patient has been contacted and is very interested in participating in Virtual Cardiac Rehab.  Thank you for your continued support in helping [place] meet the health care needs of our patients.  Tedra Senegal. Support Rep II   Cardiac Rehab staff

## 2019-03-10 NOTE — Progress Notes (Signed)
Transitioning to virtual cardiac rehab   Dr.  Tamala Julian   As you are aware our department remains closed to patients due to Covid-19.  We are excited to be able to offer an alternative to traditional onsite Cardiac Rehab while your patient continues to follow Re-Open guidelines.  This is a notification that your patient has been contacted and is very interested in participating in Virtual Cardiac Rehab.  Thank you for your continued support in helping [place] meet the health care needs of our patients.  Tedra Senegal. Support Rep II   Cardiac Rehab staff

## 2019-03-12 ENCOUNTER — Encounter (HOSPITAL_COMMUNITY): Payer: 59

## 2019-03-15 ENCOUNTER — Encounter (HOSPITAL_COMMUNITY)
Admission: RE | Admit: 2019-03-15 | Discharge: 2019-03-15 | Disposition: A | Discharge status: Home / Self Care | Payer: 59 | Source: Ambulatory Visit | Attending: Interventional Cardiology | Admitting: Interventional Cardiology

## 2019-03-15 ENCOUNTER — Encounter (HOSPITAL_COMMUNITY): Payer: 59

## 2019-03-15 ENCOUNTER — Other Ambulatory Visit: Payer: Self-pay

## 2019-03-15 ENCOUNTER — Encounter (HOSPITAL_COMMUNITY): Payer: Self-pay | Admitting: *Deleted

## 2019-03-15 ENCOUNTER — Telehealth (HOSPITAL_COMMUNITY): Payer: Self-pay

## 2019-03-15 DIAGNOSIS — I214 Non-ST elevation (NSTEMI) myocardial infarction: Secondary | ICD-10-CM | POA: Insufficient documentation

## 2019-03-15 DIAGNOSIS — Z955 Presence of coronary angioplasty implant and graft: Secondary | ICD-10-CM | POA: Insufficient documentation

## 2019-03-15 NOTE — Telephone Encounter (Signed)
° °        Confirm Consent - In the setting of the current Covid19 crisis, you are scheduled for a phone visit with your Cardiac or Pulmonary team member.  Just as we do with many in-gym visits, in order for you to participate in this visit, we must obtain consent.  If you'd like, I can send this to your mychart (if signed up) or email for you to review.  Otherwise, I can obtain your verbal consent now.  By agreeing to a telephone visit, we'd like you to understand that the technology does not allow for your Cardiac or Pulmonary Rehab team member to perform a physical assessment, and thus may limit their ability to fully assess your ability to perform exercise programs. If your provider identifies any concerns that need to be evaluated in person, we will make arrangements to do so.  Finally, though the technology is pretty good, we cannot assure that it will always work on either your or our end and we cannot ensure that we have a secure connection.  Cardiac and Pulmonary Rehab Telehealth visits and At Home cardiac and pulmonary rehab are provided at no cost to you.               Are you willing to proceed?" STAFF: Did the patient verbally acknowledge consent to telehealth visit? Document YES/NO here: Yes      Jessica C.   Cardiac and Pulmonary Rehab Staff   D6/10/2018 T9:45AM

## 2019-03-15 NOTE — Progress Notes (Signed)
Called and spoke to pt regarding Virtual Cardiac Rehab.  Pt  was able to download the [employer] app on their smart device with no issues. Pt set up their account and received the following welcome message -"Welcome to the [place] and Pulmonary Rehabilitation program. We hope that you will find the exercise program beneficial in your recovery process. Our staff is available to assist with in questions/concerns about your exercise routine. Best wishes". Brief orientation provided to with the advisement to watch the "Intro to Rehab" series located under the Resource tab. Pt verbalized understanding. Will continue to follow and monitor pt progress with feedback as needed. Barnet Pall, RN,BSN 03/15/2019 12:32 PM

## 2019-03-17 ENCOUNTER — Encounter (HOSPITAL_COMMUNITY): Payer: 59

## 2019-03-22 ENCOUNTER — Other Ambulatory Visit: Payer: Self-pay | Admitting: Interventional Cardiology

## 2019-03-22 ENCOUNTER — Telehealth: Payer: Self-pay

## 2019-03-22 NOTE — Telephone Encounter (Signed)
    COVID-19 Pre-Screening Questions:  . In the past 7 to 10 days have you had a cough,  shortness of breath, headache, congestion, fever (100 or greater) body aches, chills, sore throat, or sudden loss of taste or sense of smell? . Have you been around anyone with known Covid 19. . Have you been around anyone who is awaiting Covid 19 test results in the past 7 to 10 days? . Have you been around anyone who has been exposed to Covid 19, or has mentioned symptoms of Covid 19 within the past 7 to 10 days?  If you have any concerns/questions about symptoms patients report during screening (either on the phone or at threshold). Contact the provider seeing the patient or DOD for further guidance.  If neither are available contact a member of the leadership team.          Pt answered NO to all pre-screening questions. klb [id number] 03/22/2019

## 2019-03-23 ENCOUNTER — Other Ambulatory Visit: Payer: 59

## 2019-03-23 ENCOUNTER — Other Ambulatory Visit: Payer: Self-pay

## 2019-03-23 DIAGNOSIS — E785 Hyperlipidemia, unspecified: Secondary | ICD-10-CM

## 2019-03-23 LAB — LIPID PANEL
Chol/HDL Ratio: 2.5 ratio (ref 0.0–5.0)
Cholesterol, Total: 115 mg/dL (ref 100–199)
HDL: 46 mg/dL (ref >39)
LDL Calculated: 50 mg/dL (ref 0–99)
Triglycerides: 96 mg/dL (ref 0–149)
VLDL Cholesterol Cal: 19 mg/dL (ref 5–40)

## 2019-03-23 LAB — HEPATIC FUNCTION PANEL
ALT: 27 IU/L (ref 0–44)
AST: 30 IU/L (ref 0–40)
Albumin: 4.5 g/dL (ref 3.8–4.9)
Alkaline Phosphatase: 71 IU/L (ref 39–117)
Bilirubin Total: 0.6 mg/dL (ref 0.0–1.2)
Bilirubin, Direct: 0.19 mg/dL (ref 0.00–0.40)
Total Protein: 6.7 g/dL (ref 6.0–8.5)

## 2019-03-29 ENCOUNTER — Other Ambulatory Visit: Payer: Self-pay | Admitting: Interventional Cardiology

## 2019-03-29 MED ORDER — ROSUVASTATIN CALCIUM 5 MG PO TABS: 5.0000 mg | ORAL_TABLET | Freq: Every day | ORAL | 2 refills | Status: DC

## 2019-03-29 MED ORDER — EZETIMIBE 10 MG PO TABS: 10.0000 mg | ORAL_TABLET | Freq: Every day | ORAL | 2 refills | Status: DC

## 2019-03-29 MED ORDER — METOPROLOL TARTRATE 25 MG PO TABS: 25.0000 mg | ORAL_TABLET | Freq: Two times a day (BID) | ORAL | 8 refills | Status: DC

## 2019-03-29 NOTE — Addendum Note (Signed)
Addended by: Derl Barrow on: 03/29/2019 10:14 AM   Modules accepted: Orders

## 2019-03-29 NOTE — Telephone Encounter (Signed)
Pt's medications were sent to pt's pharmacy as requested. Confirmation received.  

## 2019-04-07 NOTE — Addendum Note (Signed)
Encounter addended by: Noel Christmas, RN on: 04/07/2019 4:12 PM  Actions taken: Clinical Note Signed

## 2019-04-07 NOTE — Progress Notes (Signed)
Discharge Progress Report  Patient Details  Name: Andrew Mitchell MRN: [id number] Date of Birth: May 26, 1961 Referring Provider:     CARDIAC REHAB PHASE II ORIENTATION from 12/10/2018 in [place]  Referring Provider  Belva Crome MD        Number of Visits: 5  Reason for Discharge:  Patient reached a stable level of exercise. Patient independent in their exercise. Patient has met program and personal goals.  Smoking History:  Social History   Tobacco Use  Smoking Status Never Smoker  Smokeless Tobacco Never Used    Diagnosis:  No diagnosis found.  ADL UCSD:   Initial Exercise Prescription: Initial Exercise Prescription - 12/10/18 1000      Date of Initial Exercise RX and Referring Provider   Date  12/10/18    Referring Provider  Belva Crome MD     Expected Discharge Date  03/17/19      Treadmill   MPH  3    Grade  1    Minutes  10    METs  3.71      Bike   Level  1    Minutes  10    METs  3.22      NuStep   Level  2    SPM  85    Minutes  10    METs  3      Prescription Details   Frequency (times per week)  3x    Duration  Progress to 30 minutes of continuous aerobic without signs/symptoms of physical distress      Intensity   THRR 40-80% of Max Heartrate  65-130    Ratings of Perceived Exertion  11-13    Perceived Dyspnea  0-4      Progression   Progression  Continue progressive overload as per policy without signs/symptoms or physical distress.      Resistance Training   Training Prescription  Yes    Weight  5lbs    Reps  10-15       Discharge Exercise Prescription (Final Exercise Prescription Changes): Exercise Prescription Changes - 12/28/18 1327      Response to Exercise   Blood Pressure (Admit)  126/72    Blood Pressure (Exercise)  144/78    Blood Pressure (Exit)  122/72    Heart Rate (Admit)  79 bpm    Heart Rate (Exercise)  121 bpm    Heart Rate (Exit)  83 bpm    Rating of Perceived  Exertion (Exercise)  12    Perceived Dyspnea (Exercise)  0    Symptoms  None    Comments  None    Duration  Progress to 30 minutes of  aerobic without signs/symptoms of physical distress    Intensity  THRR unchanged      Progression   Progression  Continue to progress workloads to maintain intensity without signs/symptoms of physical distress.    Average METs  4.2      Resistance Training   Training Prescription  Yes    Weight  5lbs    Reps  10-15    Time  10 Minutes      Treadmill   MPH  3.4    Grade  2    Minutes  10    METs  4.54      Bike   Level  1    Minutes  10    METs  3.23      NuStep  Level  3    SPM  105    Minutes  10    METs  4.7       Functional Capacity: 6 Minute Walk    Row Name 12/10/18 1016         6 Minute Walk   Phase  Initial     Distance  1946 feet     Walk Time  6 minutes     # of Rest Breaks  0     MPH  3.69     METS  4.53     RPE  11     Perceived Dyspnea   0     VO2 Peak  15.84     Symptoms  No     Resting HR  65 bpm     Resting BP  118/70     Resting Oxygen Saturation   98 %     Exercise Oxygen Saturation  during 6 min walk  99 %     Max Ex. HR  83 bpm     Max Ex. BP  122/78     2 Minute Post BP  124/72        Psychological, QOL, Others - Outcomes: PHQ 2/9: Depression screen PHQ 2/9 12/14/2018  Decreased Interest 0  Down, Depressed, Hopeless 0  PHQ - 2 Score 0    Quality of Life: Quality of Life - 12/10/18 0801      Quality of Life   Select  Quality of Life      Quality of Life Scores   Health/Function Pre  23.77 %    Socioeconomic Pre  24.75 %    Psych/Spiritual Pre  25.5 %    Family Pre  25.2 %    GLOBAL Pre  24.54 %       Personal Goals: Goals established at orientation with interventions provided to work toward goal. Personal Goals and Risk Factors at Admission - 12/10/18 1034      Core Components/Risk Factors/Patient Goals on Admission    Weight Management  Yes;Weight Loss    Intervention   Weight Management: Develop a combined nutrition and exercise program designed to reach desired caloric intake, while maintaining appropriate intake of nutrient and fiber, sodium and fats, and appropriate energy expenditure required for the weight goal.;Weight Management: Provide education and appropriate resources to help participant work on and attain dietary goals.;Weight Management/Obesity: Establish reasonable short term and long term weight goals.    Admit Weight  188 lb 0.8 oz (85.3 kg)    Goal Weight: Long Term  178 lb (80.7 kg)    Expected Outcomes  Short Term: Continue to assess and modify interventions until short term weight is achieved;Long Term: Adherence to nutrition and physical activity/exercise program aimed toward attainment of established weight goal;Weight Loss: Understanding of general recommendations for a balanced deficit meal plan, which promotes 1-2 lb weight loss per week and includes a negative energy balance of [phone] kcal/d;Understanding recommendations for meals to include 15-35% energy as protein, 25-35% energy from fat, 35-60% energy from carbohydrates, less than '200mg'$  of dietary cholesterol, 20-35 gm of total fiber daily;Understanding of distribution of calorie intake throughout the day with the consumption of 4-5 meals/snacks    Hypertension  Yes    Intervention  Provide education on lifestyle modifcations including regular physical activity/exercise, weight management, moderate sodium restriction and increased consumption of fresh fruit, vegetables, and low fat dairy, alcohol moderation, and smoking cessation.;Monitor prescription use compliance.    Expected  Outcomes  Short Term: Continued assessment and intervention until BP is < 140/43m HG in hypertensive participants. < 130/84mHG in hypertensive participants with diabetes, heart failure or chronic kidney disease.;Long Term: Maintenance of blood pressure at goal levels.    Lipids  Yes    Intervention  Provide  education and support for participant on nutrition & aerobic/resistive exercise along with prescribed medications to achieve LDL '70mg'$ , HDL >'40mg'$ .    Expected Outcomes  Short Term: Participant states understanding of desired cholesterol values and is compliant with medications prescribed. Participant is following exercise prescription and nutrition guidelines.;Long Term: Cholesterol controlled with medications as prescribed, with individualized exercise RX and with personalized nutrition plan. Value goals: LDL < '70mg'$ , HDL > 40 mg.        Personal Goals Discharge: Goals and Risk Factor Review    Row Name 12/14/18 0828 12/30/18 1347           Core Components/Risk Factors/Patient Goals Review   Personal Goals Review  Weight Management/Obesity;Lipids;Hypertension  Weight Management/Obesity;Lipids;Hypertension      Review  Pt with multiple CAD RFs willing to participate in CR exercise.  JaUlice Dashould ike to get back to walking 5 miles a day and keep up an exercise routine.   Pt continues to tolerate exercise well.  Exercise currently on hold as department is closed per recommended guidelines from federal government to prevent spread of COVID-19.       Expected Outcomes  Pt will continue to participate in CR exercise, nutrition, and lifestyle modification opportunities.   Pt will continue to participate in CR exercise, nutrition, and lifestyle modification opportunities.          Exercise Goals and Review: Exercise Goals    Row Name 12/10/18 1018             Exercise Goals   Increase Physical Activity  Yes       Intervention  Provide advice, education, support and counseling about physical activity/exercise needs.;Develop an individualized exercise prescription for aerobic and resistive training based on initial evaluation findings, risk stratification, comorbidities and participant's personal goals.       Expected Outcomes  Short Term: Attend rehab on a regular basis to increase amount of  physical activity.;Long Term: Add in home exercise to make exercise part of routine and to increase amount of physical activity.;Long Term: Exercising regularly at least 3-5 days a week.       Increase Strength and Stamina  Yes       Intervention  Provide advice, education, support and counseling about physical activity/exercise needs.;Develop an individualized exercise prescription for aerobic and resistive training based on initial evaluation findings, risk stratification, comorbidities and participant's personal goals.       Expected Outcomes  Short Term: Increase workloads from initial exercise prescription for resistance, speed, and METs.;Short Term: Perform resistance training exercises routinely during rehab and add in resistance training at home;Long Term: Improve cardiorespiratory fitness, muscular endurance and strength as measured by increased METs and functional capacity (6MWT)       Able to understand and use rate of perceived exertion (RPE) scale  Yes       Intervention  Provide education and explanation on how to use RPE scale       Expected Outcomes  Short Term: Able to use RPE daily in rehab to express subjective intensity level;Long Term:  Able to use RPE to guide intensity level when exercising independently       Knowledge and understanding of Target  Heart Rate Range (THRR)  Yes       Intervention  Provide education and explanation of THRR including how the numbers were predicted and where they are located for reference       Expected Outcomes  Short Term: Able to state/look up THRR;Short Term: Able to use daily as guideline for intensity in rehab;Long Term: Able to use THRR to govern intensity when exercising independently       Able to check pulse independently  Yes       Intervention  Provide education and demonstration on how to check pulse in carotid and radial arteries.;Review the importance of being able to check your own pulse for safety during independent exercise        Expected Outcomes  Short Term: Able to explain why pulse checking is important during independent exercise;Long Term: Able to check pulse independently and accurately       Understanding of Exercise Prescription  Yes       Intervention  Provide education, explanation, and written materials on patient's individual exercise prescription       Expected Outcomes  Short Term: Able to explain program exercise prescription;Long Term: Able to explain home exercise prescription to exercise independently          Exercise Goals Re-Evaluation:   Nutrition & Weight - Outcomes: Pre Biometrics - 12/10/18 1017      Pre Biometrics   Height  5' 9.75" (1.772 m)    Weight  85.3 kg    Waist Circumference  37 inches    Hip Circumference  41 inches    Waist to Hip Ratio  0.9 %    BMI (Calculated)  27.17    Triceps Skinfold  20 mm    % Body Fat  26.6 %    Grip Strength  42 kg    Flexibility  13 in    Single Leg Stand  30 seconds        Nutrition: Nutrition Therapy & Goals - 12/11/18 0835      Nutrition Therapy   Diet  Heart healthy      Personal Nutrition Goals   Nutrition Goal  Pt to identify and limit food sources of saturated fat, trans fat, refined carbohydrates and sodium    Personal Goal #2  Pt to identify food quantities necessary to achieve weight loss of 6-24 lb at graduation from cardiac rehab    Personal Goal #3  Pt to build a healthy plate including vegetables, fruits, whole grains, and low-fat dairy products in a heart healthy meal plan    Personal Goal #4  Pt to decrease consumption of fried foods      Intervention Plan   Intervention  Prescribe, educate and counsel regarding individualized specific dietary modifications aiming towards targeted core components such as weight, hypertension, lipid management, diabetes, heart failure and other comorbidities.    Expected Outcomes  Short Term Goal: Understand basic principles of dietary content, such as calories, fat, sodium, cholesterol  and nutrients.;Long Term Goal: Adherence to prescribed nutrition plan.       Nutrition Discharge: Nutrition Assessments - 12/11/18 0838      MEDFICTS Scores   Pre Score  26       Education Questionnaire Score: Knowledge Questionnaire Score - 12/10/18 0745      Knowledge Questionnaire Score   Pre Score  23/24       Goals reviewed with patient; copy given to patient.

## 2019-05-03 ENCOUNTER — Other Ambulatory Visit: Payer: Self-pay | Admitting: Interventional Cardiology

## 2019-05-03 MED ORDER — NITROGLYCERIN 0.4 MG SL SUBL: 0.4000 mg | SUBLINGUAL_TABLET | SUBLINGUAL | 5 refills | Status: DC | PRN

## 2019-05-31 ENCOUNTER — Other Ambulatory Visit: Payer: Self-pay | Admitting: Interventional Cardiology

## 2019-05-31 MED ORDER — TICAGRELOR 90 MG PO TABS: 90.0000 mg | ORAL_TABLET | Freq: Two times a day (BID) | ORAL | 7 refills | Status: DC

## 2019-06-22 ENCOUNTER — Other Ambulatory Visit: Payer: Self-pay | Admitting: Interventional Cardiology

## 2019-06-22 MED ORDER — NITROGLYCERIN 0.4 MG SL SUBL: 0.4000 mg | SUBLINGUAL_TABLET | SUBLINGUAL | 0 refills | Status: AC | PRN

## 2019-06-22 NOTE — Addendum Note (Signed)
Addended by: Derl Barrow on: 06/22/2019 12:08 PM   Modules accepted: Orders

## 2019-06-22 NOTE — Telephone Encounter (Signed)
Pt's pharmacy requested a 90 day supply. Confirmation received.

## 2019-07-01 NOTE — Progress Notes (Addendum)
CARDIOLOGY OFFICE NOTE  Date:  07/06/2019    Andrew Mitchell Date of Birth: 10/08/1961 Medical Record [id number]  PCP:  [name], MD  Cardiologist:  Tamala Julian  Chief Complaint  Patient presents with  . Follow-up    History of Present Illness: Andrew Mitchell is a 58 y.o. male who presents today for a 6 month check. Seen for Dr. Tamala Julian.   He has a history of DOE, + FH for CAD (father with MI), HLD, CAD with prior inferior MI in 2019 treated with DES to the LCX due to total occlusion - also with severe stenosis of the right PL at the origin of the 2nd PL.   Last seen here in March by Dr. Tamala Julian. Noted several side effects - having significant ED, Crestor 40 mg led to elevated LFTs. He was otherwise felt to be doing well.   The patient does not have symptoms concerning for COVID-19 infection (fever, chills, cough, or new shortness of breath).   Comes in today. Here alone. He is doing quite well. He has been working remotely. His work is slow - so he has played lots of golf, done lots of projects at home and walking - sometimes 8 miles a day. He has had no chest pain similar to prior chest pain syndrome which was more of a pressure sensation. He had some "prickly" feeling for a minute or so on 2 occasions. Nothing exertional. He is on Vyvanse - for past 5 years due to ADD/inability to concentrate -  we talked about the implications - he was thinking about getting off this - his current job is not stressful and he really has no issue with focusing.  He is tolerating his current medicines. He will need some follow up health maintenance once his DAPT is completed. He is on Sinemet for restless legs with good results. He does not have Parkinson's. Overall, he is quite happy with how he is doing. Had his flu shot yesterday.  Past Medical History:  Diagnosis Date  . Back pain   . CAD (coronary artery disease)    a. 09/2018 Inf STEMI/PCI: LM nl, LAD min irregs, D1/2 min irregs, LCX 100ost/p  thrombotic (2.75x35 Orsiro DES), RCA 40d, RPAV 90.  . Carpal tunnel syndrome   . DDD (degenerative disc disease)   . Diastolic dysfunction    a. 09/2018 Echo: EF 50-55%, prob inflat and antlat HK, Gr1 DD. Mildlly dil Ao root (80m). Nl RV fxn.  . Hyperlipemia   . Mildly Dilated aortic root ([place])    a. 09/2018 Echo: 44mm.  . Tubular adenocarcinoma ([place]) 2010   colon    Past Surgical History:  Procedure Laterality Date  . CARPAL TUNNEL RELEASE Left 01/06/2013   Procedure: CARPAL TUNNEL RELEASE;  Surgeon: Wynonia Sours, MD;  Location: [facility];  Service: Orthopedics;  Laterality: Left;  ANESTHESIA: IV REGIONAL FAB  . COLONOSCOPY    . CORONARY ANGIOGRAPHY N/A 10/08/2018   Procedure: CORONARY ANGIOGRAPHY;  Surgeon: Sherren Mocha, MD;  Location: [facility];  Service: Cardiovascular;  Laterality: N/A;  . CORONARY STENT INTERVENTION N/A 10/08/2018   Procedure: CORONARY STENT INTERVENTION;  Surgeon: Sherren Mocha, MD;  Location: [facility];  Service: Cardiovascular;  Laterality: N/A;  . CORONARY/GRAFT ACUTE MI REVASCULARIZATION N/A 10/08/2018   Procedure: Coronary/Graft Acute MI Revascularization;  Surgeon: Sherren Mocha, MD;  Location: [facility];  Service: Cardiovascular;  Laterality: N/A;  . KNEE ARTHROSCOPY  rightx2  . RIB RESECTION  2003   thorasic outlet syndrome-rt  . SHOULDER ARTHROSCOPY     rightx2  . TONSILLECTOMY       Medications: Current Meds  Medication Sig  . aspirin 81 MG tablet Take 81 mg by mouth daily.  . carbidopa-levodopa (SINEMET IR) 25-100 MG per tablet Take 1/2 to 1 tablet by mouth daily before bedtime.  Marland Kitchen ezetimibe (ZETIA) 10 MG tablet Take 1 tablet (10 mg total) by mouth daily.  Javier Docker Oil 1000 MG CAPS Take 1,000 mg by mouth daily.  Marland Kitchen lisdexamfetamine (VYVANSE) 60 MG capsule Take 60 mg by mouth every morning.   . metoprolol tartrate (LOPRESSOR) 25 MG tablet Take 1 tablet (25 mg total) by mouth 2 (two) times  daily.  . Multiple Vitamins-Minerals (CENTRUM PO) Take 1 tablet by mouth daily.  . nitroGLYCERIN (NITROSTAT) 0.4 MG SL tablet Place 1 tablet (0.4 mg total) under the tongue every 5 (five) minutes x 3 doses as needed for chest pain.  . rosuvastatin (CRESTOR) 5 MG tablet Take 1 tablet (5 mg total) by mouth daily.  . saw palmetto 160 MG capsule Take 320 mg by mouth daily.  . ticagrelor (BRILINTA) 90 MG TABS tablet Take 1 tablet (90 mg total) by mouth 2 (two) times daily.  . vitamin B-12 (CYANOCOBALAMIN) 1000 MCG tablet Take 3,000 mcg by mouth daily.   . vitamin C (ASCORBIC ACID) 500 MG tablet Take 500 mg by mouth daily.     Allergies: Allergies  Allergen Reactions  . Shellfish Allergy Anaphylaxis    Just to shrimp    Social History: The patient  reports that he has never smoked. He has never used smokeless tobacco. He reports current alcohol use. He reports that he does not use drugs.   Family History: The patient's family history is not on file. His father died at 42 with lung cancer and had had several Mi's in his 81's. Mother is alive at 8 and doing well. One sister who is 58 has CAD.   Review of Systems: Please see the history of present illness.   All other systems are reviewed and negative.   Physical Exam: VS:  BP 128/80   Pulse 87   Ht 5' 9.75" (1.772 m)   Wt 175 lb 6.4 oz (79.6 kg)   SpO2 97%   BMI 25.35 kg/m  .  BMI Body mass index is 25.35 kg/m.  Wt Readings from Last 3 Encounters:  07/06/19 175 lb 6.4 oz (79.6 kg)  12/25/18 184 lb 12.8 oz (83.8 kg)  12/10/18 188 lb 0.8 oz (85.3 kg)    General: Pleasant. Well developed, well nourished and in no acute distress. He has lost weight.   HEENT: Normal.  Neck: Supple, no JVD, carotid bruits, or masses noted.  Cardiac: Regular rate and rhythm. No murmurs, rubs, or gallops. No edema.  Respiratory:  Lungs are clear to auscultation bilaterally with normal work of breathing.  GI: Soft and nontender.  MS: No deformity or  atrophy. Gait and ROM intact.  Skin: Warm and dry. Color is normal.  Neuro:  Strength and sensation are intact and no gross focal deficits noted.  Psych: Alert, appropriate and with normal affect.   LABORATORY DATA:  EKG:  EKG is not ordered today.  Lab Results  Component Value Date   WBC 10.2 10/09/2018   HGB 15.3 10/09/2018   HCT 43.6 10/09/2018   PLT 236 10/09/2018   GLUCOSE 108 (H) 10/09/2018   CHOL  115 03/23/2019   TRIG 96 03/23/2019   HDL 46 03/23/2019   LDLCALC 50 03/23/2019   ALT 27 03/23/2019   AST 30 03/23/2019   NA 137 10/09/2018   K 3.7 10/09/2018   CL 102 10/09/2018   CREATININE 0.96 10/09/2018   BUN 16 10/09/2018   CO2 21 (L) 10/09/2018   INR 0.96 10/08/2018   HGBA1C 5.5 12/01/2018     BNP (last 3 results) No results for input(s): BNP in the last 8760 hours.  ProBNP (last 3 results) No results for input(s): PROBNP in the last 8760 hours.   Other Studies Reviewed Today:  Echo Study Conclusions 09/2018  - Left ventricle: The cavity size was normal. Systolic function was   normal. The estimated ejection fraction was in the range of 50%   to 55%. Probable hypokinesis of the inferolateral and   anterolateral walls. Suboptimal image quality despite the use of   Definity. Doppler parameters are consistent with abnormal left   ventricular relaxation (grade 1 diastolic dysfunction). - Aortic valve: Transvalvular velocity was within the normal range.   There was no stenosis. There was no regurgitation. - Aorta: Aortic root dimension: 42 mm (ED). Mid-ascending aortic   diameter: 39 mm (ED). - Aortic root: The aortic root was mildly dilated. - Ascending aorta: The ascending aorta was mildly dilated. - Mitral valve: Transvalvular velocity was within the normal range.   There was no evidence for stenosis. There was no regurgitation. - Left atrium: The atrium was normal in size. - Right ventricle: The cavity size was normal. Wall thickness was   normal.  Systolic function was normal. - Right atrium: The atrium was normal in size. - Tricuspid valve: There was no regurgitation. - Inferior vena cava: The vessel was normal in size. The   respirophasic diameter changes were in the normal range (>= 50%),   consistent with normal central venous pressure. - Pericardium, extracardiac: There was no pericardial effusion.    CORONARY ANGIOGRAPHY  Coronary/Graft Acute MI Revascularization  CORONARY STENT INTERVENTION  Conclusion    Ost Cx to Prox Cx lesion is 100% stenosed.  Post Atrio lesion is 90% stenosed.  Dist RCA lesion is 40% stenosed.  A drug-eluting stent was successfully placed using a STENT ORSIRO 2.75X35.  Post intervention, there is a 0% residual stenosis.   1.  Patent left main, RCA, and LAD with mild diffuse luminal irregularities 2.  Severe stenosis of the right posterior lateral branch at the origin of the second PL 3.  Acute total occlusion of the proximal circumflex, treated successfully with primary PCI using a 2.75 x 35 mm Orsiro DES  Recommend:   ASA/Brilinta x 12 months without interruption  Echo for assessment of LV function  Post-MI medical therapy  **There is a non-system delay for difficulty passing guide catheters through the arm and difficulty wiring the circumflex secondary to severe angulation and tortuosity. Multiple guides and wires are used for the procedure**      Assessment/Plan:  1. CAD with prior MI with DES to LCX - he is doing well clinically and doing a great job with CV risk factor modification. Lab today. Remains on DAPT until December. He notes from a prior conversation with Dr. Tamala Julian that they will then discuss long term use/changing therapy. He is going to talk with his prescriber about his ADD medicine and may be looking at weaning off of.   2. HLD - has had prior elevated LFTs with high intense therapy - now  on low dose statin with Zetia - rechecking lab today. He is fasting.    3. HTN - BP is great.   4. ED - was given the ok to use PDE 5 inhibitor therapy - not noted today.   5. Mildly dilated aorta - discussed with patient - precautions given and will arrange for CTA in December as follow up.   7. COVID-19 Education: The signs and symptoms of COVID-19 were discussed with the patient and how to seek care for testing (follow up with PCP or arrange E-visit).  The importance of social distancing, staying at home, hand hygiene and wearing a mask when out in public were discussed today.  Current medicines are reviewed with the patient today.  The patient does not have concerns regarding medicines other than what has been noted above.  The following changes have been made:  See above.  Labs/ tests ordered today include:    Orders Placed This Encounter  Procedures  . Basic metabolic panel  . CBC  . Hepatic function panel  . Lipid panel     Disposition:   FU with Dr. Tamala Julian in about 4 months.  Patient is agreeable to this plan and will call if any problems develop in the interim.   SignedTruitt Merle, NP  07/06/2019 9:26 AM  [facility] [address] [place],   [address] Phone: [phone] Fax: [phone]

## 2019-07-06 ENCOUNTER — Encounter: Payer: Self-pay | Admitting: Nurse Practitioner

## 2019-07-06 ENCOUNTER — Ambulatory Visit (INDEPENDENT_AMBULATORY_CARE_PROVIDER_SITE_OTHER): Payer: 59 | Admitting: Nurse Practitioner

## 2019-07-06 ENCOUNTER — Other Ambulatory Visit: Payer: Self-pay

## 2019-07-06 VITALS — BP 128/80 | HR 87 | Ht 69.75 in | Wt 175.4 lb

## 2019-07-06 DIAGNOSIS — I2121 ST elevation (STEMI) myocardial infarction involving left circumflex coronary artery: Secondary | ICD-10-CM

## 2019-07-06 DIAGNOSIS — I25110 Atherosclerotic heart disease of native coronary artery with unstable angina pectoris: Secondary | ICD-10-CM

## 2019-07-06 DIAGNOSIS — I712 Thoracic aortic aneurysm, without rupture, unspecified: Secondary | ICD-10-CM

## 2019-07-06 DIAGNOSIS — E785 Hyperlipidemia, unspecified: Secondary | ICD-10-CM | POA: Diagnosis not present

## 2019-07-06 DIAGNOSIS — I10 Essential (primary) hypertension: Secondary | ICD-10-CM

## 2019-07-06 LAB — BASIC METABOLIC PANEL
BUN/Creatinine Ratio: 20 (ref 9–20)
BUN: 20 mg/dL (ref 6–24)
CO2: 24 mmol/L (ref 20–29)
Calcium: 9.7 mg/dL (ref 8.7–10.2)
Chloride: 101 mmol/L (ref 96–106)
Creatinine, Ser: 1.02 mg/dL (ref 0.76–1.27)
GFR calc Af Amer: 93 mL/min/{1.73_m2} (ref >59)
GFR calc non Af Amer: 80 mL/min/{1.73_m2} (ref >59)
Glucose: 101 mg/dL — ABNORMAL HIGH (ref 65–99)
Potassium: 5 mmol/L (ref 3.5–5.2)
Sodium: 139 mmol/L (ref 134–144)

## 2019-07-06 LAB — CBC
Hematocrit: 46.6 % (ref 37.5–51.0)
Hemoglobin: 16.1 g/dL (ref 13.0–17.7)
MCH: 33.1 pg — ABNORMAL HIGH (ref 26.6–33.0)
MCHC: 34.5 g/dL (ref 31.5–35.7)
MCV: 96 fL (ref 79–97)
Platelets: 235 10*3/uL (ref 150–450)
RBC: 4.87 x10E6/uL (ref 4.14–5.80)
RDW: 12.5 % (ref 11.6–15.4)
WBC: 4.9 10*3/uL (ref 3.4–10.8)

## 2019-07-06 LAB — LIPID PANEL
Chol/HDL Ratio: 2.4 ratio (ref 0.0–5.0)
Cholesterol, Total: 123 mg/dL (ref 100–199)
HDL: 52 mg/dL (ref >39)
LDL Chol Calc (NIH): 57 mg/dL (ref 0–99)
Triglycerides: 68 mg/dL (ref 0–149)
VLDL Cholesterol Cal: 14 mg/dL (ref 5–40)

## 2019-07-06 LAB — HEPATIC FUNCTION PANEL
ALT: 28 IU/L (ref 0–44)
AST: 25 IU/L (ref 0–40)
Albumin: 4.5 g/dL (ref 3.8–4.9)
Alkaline Phosphatase: 68 IU/L (ref 39–117)
Bilirubin Total: 0.7 mg/dL (ref 0.0–1.2)
Bilirubin, Direct: 0.21 mg/dL (ref 0.00–0.40)
Total Protein: 6.9 g/dL (ref 6.0–8.5)

## 2019-07-06 NOTE — Patient Instructions (Addendum)
After Visit Summary:  We will be checking the following labs today - BMET, CBC, HPF and Lipids   Medication Instructions:    Continue with your current medicines.   Talk with your prescriber about the Vyvanse   If you need a refill on your cardiac medications before your next appointment, please call your pharmacy.     Testing/Procedures To Be Arranged:  CTA of the aorta - in December  Follow-Up:   See Dr. Tamala Julian in December    At [facility], [place], you and your health needs are our priority.  As part of our continuing mission to provide you with exceptional heart care, we have created designated Provider Care Teams.  These Care Teams include your primary Cardiologist (physician) and Advanced Practice Providers (APPs -  Physician Assistants and Nurse Practitioners) who all work together to provide you with the care you need, when you need it.  Special Instructions:  . Stay safe, stay home, wash your hands for at least 20 seconds and wear a mask when out in public.  . It was good to talk with you today.                               Ascending Aortic Aneurysm/ Thoracic Aortic Aneurysm   Recent studies have raised concern that fluoroquinolone antibiotics could be associated with an increased risk of aortic aneurysm or aortic dissection. You should avoid use of Cipro and other associated antibiotics (flouroquinolone antibiotics )  It is  best to avoid activities that cause grunting or straining (medically referred to as a "valsalva maneuver"). This happens when a person bears down against a closed throat to increase the strength of arm or abdominal muscles. There's often a tendency to do this when lifting heavy weights, doing sit-ups, push-ups or chin-ups, etc., but it may be harmful.     An aneurysm is a bulge in an artery. It happens when blood pushes up against a weakened or damaged artery wall. A thoracic aortic aneurysm is an aneurysm that occurs in the first part of the  aorta, between the heart and the diaphragm. The aorta is the main artery of the body. It supplies blood from the heart to the rest of the body. Some aneurysms may not cause symptoms or problems. However, the major concern with a thoracic aortic aneurysm is that it can enlarge and burst (rupture), or blood can flow between the layers of the wall of the aorta through a tear (aorticdissection). Both of these conditions can cause bleeding inside the body and can be life-threatening if they are not diagnosed and treated right away. What are the causes? The exact cause of this condition is not known. What increases the risk? The following factors may make you more likely to develop this condition:  Being age 59 or older.  Having a hardening of the arteries caused by the buildup of fat and other substances in the lining of a blood vessel (arteriosclerosis).  Having inflammation of the walls of an artery (arteritis).  Having a genetic disease that weakens the body's connective tissue, such as Marfan syndrome.  Having an injury or trauma to the aorta.  Having an infection that is caused by bacteria, such as syphilis or staphylococcus, in the wall of the aorta (infectious aortitis).  Having high blood pressure (hypertension).  Being male.  Being white (Caucasian).  Having high cholesterol.  Having a family history of aneurysms.  Using tobacco.  Having chronic obstructive pulmonary disease (COPD). What are the signs or symptoms? Symptoms of this condition vary depending on the size and rate of growth of the aneurysm. Most grow slowly and do not cause any symptoms. When symptoms do occur, they may include:  Pain in the chest, back, sides, or abdomen. The pain may vary in intensity. A sudden onset of severe pain may indicate that the aneurysm has ruptured.  Hoarseness.  Cough.  Shortness of breath.  Swallowing problems.  Swelling in the face, arms, or legs.  Fever.  Unexplained  weight loss. How is this diagnosed? This condition may be diagnosed with:  An ultrasound.  X-rays.  A CT scan.  An MRI.  Tests to check the arteries for damage or blockages (angiogram). Most unruptured thoracic aortic aneurysms cause no symptoms, so they are often found during exams for other conditions. How is this treated? Treatment for this condition depends on:  The size of the aneurysm.  How fast the aneurysm is growing.  Your age.  Risk factors for rupture. Aneurysms that are smaller than 2.2 inches (5.5 cm) may be managed by using medicines to control blood pressure, manage pain, or fight infection. You may need regular monitoring to see if the aneurysm is getting bigger. Your health care provider may recommend that you have an ultrasound every year or every 6 months. How often you need to have an ultrasound depends on the size of the aneurysm, how fast it is growing, and whether you have a family history of aneurysms. Surgical repair may be needed if your aneurysm is larger than 2.2 inches or if it is growing quickly. Follow these instructions at home: Eating and drinking   Eat a healthy diet. Your health care provider may recommend that you:  Lower your salt (sodium) intake. In some people, too much salt can raise blood pressure and increase the risk of thoracic aortic aneurysm.  Avoid foods that are high in saturated fat and cholesterol, such as red meat and dairy.  Eat a diet that is low in sugar.  Increase your fiber intake by including whole grains, vegetables, and fruits in your diet. Eating these foods may help to lower blood pressure.  Limit or avoid alcohol as recommended by your health care provider. Lifestyle   Follow instructions from your health care provider about healthy lifestyle habits. Your health care provider may recommend that you:  Do not use any products that contain nicotine or tobacco, such as cigarettes and e-cigarettes. If you need help  quitting, ask your health care provider.  Keep your blood pressure within normal limits. The target limit for most people is below 120/80. Check your blood pressure regularly. If it is high, ask your health care provider about ways that you can control it.  Keep your blood sugar (glucose) level and cholesterol levels within normal limits. Target limits for most people are:  Blood glucose level: Less than 100 mg/dL.  Total cholesterol level: Less than 200 mg/dL.  Maintain a healthy weight. Activity   Stay physically active and exercise regularly. Talk with your health care provider about how often you should exercise and ask which types of exercise are safe for you.  Avoid heavy lifting and activities that take a lot of effort (are strenuous). Ask your health care provider what activities are safe for you. General instructions   Keep all follow-up visits as told by your health care provider. This is important.  Talk with your health care provider about regular  screenings to see if the aneurysm is getting bigger.  Take over-the-counter and prescription medicines only as told by your health care provider. Contact a health care provider if:  You have discomfort in your upper back, neck, or abdomen.  You have trouble swallowing.  You have a cough or hoarseness.  You have a family history of aneurysms.  You have unexplained weight loss. Get help right away if:  You have sudden, severe pain in your upper back and abdomen. This pain may move into your chest and arms.  You have shortness of breath.  You have a fever. This information is not intended to replace advice given to you by your health care provider. Make sure you discuss any questions you have with your health care provider. Document Released: 09/30/2005 Document Revised: 07/12/2016 Document Reviewed: 07/12/2016 Elsevier Interactive Patient Education  2017 [employer].   Aortic Dissection An aortic dissection happens  when there is a tear in the main blood vessel of the body (aorta). The aorta comes out of the heart, curves around, and then goes down the chest (thoracic aorta) and into the abdomen (abdominal aorta) to supply arteries with blood. The wall of the aorta has inner and outer layers. Aortic dissection occurs most often in the thoracic aorta. As the tear widens and blood flows through it, the aorta becomes "double-barreled." This means that one part of the aorta continues to carry blood to the body, but blood also flows into the tear, between the layers of the aorta. The torn part of the aorta fills with blood and swells up. This can reduce blood flow through the part of the aorta that is still supplying blood to the body. Aortic dissection is a medical emergency. What are the causes? An aortic dissection is commonly caused by weakening of the artery wall due to high blood pressure. Other causes may include:  An injury, such as from a car crash.  Birth defects that affect the heart (congenital heart defects).  Thickening of the artery walls. In some cases, the cause is not known. What increases the risk? The following factors may make you more likely to develop this condition:  Having certain medical conditions, such as:  High blood pressure (hypertension).  Hardening and narrowing of the arteries (atherosclerosis).  A genetic disorder that affects the connective tissue, such as Marfan syndrome or Ehlers-Danlos syndrome.  A condition that causes inflammation of blood vessels, such as giant cell arteritis.  Having a chest injury.  Having surgery on the aorta.  Being born with a congenital heart defect.  Being male.  Being older than age 43.  Using cocaine.  Smoking.  Lifting heavy weights or doing other types of high-intensity resistance training. What are the signs or symptoms? Signs and symptoms of aortic dissection start suddenly. The most common symptoms are:  Severe chest  pain that may feel like tearing, stabbing, or sharp pain.  Severe pain that spreads (radiates) to the back, neck, jaw, or abdomen. Other symptoms may include:  Trouble breathing.  Dizziness or fainting.  Sudden weakness on one side of the body.  Nausea or vomiting.  Trouble swallowing.  Coughing up blood.  Vomiting blood.  Clammy skin. How is this diagnosed? This condition may be diagnosed based on:  Your symptoms.  A physical exam. This may include:  Listening for abnormal blood flow sounds (murmurs) in your chest or abdomen.  Checking your pulse in your arms and legs.  Checking your blood pressure to see whether  it is low or whether there is a difference between the measurements in your right and left arm.  Electrocardiogram (ECG). This test measures the electrical activity in your heart.  Chest X-ray.  CT scan.  MRI.  Aortic angiogram. This test involves injecting dye to make it easier to see your blood vessels clearly.  Echocardiogram to study your heart using sound waves.  Blood tests. How is this treated? It is important to treat an aortic dissection as quickly as possible. Treatment may start as soon as your health care provider thinks that you have aortic dissection. Treatment depends on the location and severity of your dissection and your overall health. Treatment may include:  Medicines to lower your blood pressure.  Surgery to repair the dissected part of your aorta with artificial material (syntheticgraft).  A medical procedure to insert a stent-graft into the aorta (endovascular procedure). During this procedure, a long, thin tube (stent) is inserted into an artery near the groin (femoral artery) and moved up to the damaged part of the aorta. Then, the stent is opened to help improve blood flow and prevent future dissection. Follow these instructions at home: Activity   Avoid activities that could injure your chest or your abdomen. Ask your  health care provider what activities are safe for you.  After you have recovered, try to stay active. Ask your health care provider what activities are safe for you after recovery.  Do not lift anything that is heavier than 10 lb (4.5 kg) until your health care provider approves.  Do not drive or use heavy machinery while taking prescription pain medicine. Eating and drinking   Eat a heart-healthy diet, which includes lots of fresh fruits and vegetables, low-fat (lean) protein, and whole grains.  Check ingredients and nutrition facts on packaged foods and beverages, and avoid foods with high amounts of:  Salt (sodium).  Saturated fats (like red meat).  Trans fats (like fried food). General instructions   Take over-the-counter and prescription medicines only as told by your health care provider.  Work with your health care provider to manage your blood pressure.  Talk with your health care provider about how to manage stress.  Do not use any products that contain nicotine or tobacco, such as cigarettes and e-cigarettes. If you need help quitting, ask your health care provider.  Keep all follow-up visits as told by your health care provider. This is important. Get help right away if:  You develop any symptoms of aortic dissection after treatment, including severe pain in your chest, back, or abdomen.  You have a pain in your abdomen.  You have trouble breathing or you develop a cough.  You faint.  You develop a racing heartbeat. These symptoms may represent a serious problem that is an emergency. Do not wait to see if the symptoms will go away. Get medical help right away. Call your local emergency services (911 in the [place] Do not drive yourself to the hospital. Summary  An aortic dissection happens when there is a tear in the main blood vessel of the body (aorta). It is a medical emergency.  The most common symptom is severe pain in the chest that spreads (radiates) to  the back, neck, jaw, or abdomen.  It is important to treat an aortic dissection as quickly as possible. Treatment typically includes surgery and medicines. This information is not intended to replace advice given to you by your health care provider. Make sure you discuss any questions you have with  your health care provider. Document Released: 01/07/2008 Document Revised: 08/19/2016 Document Reviewed: 08/19/2016 Elsevier Interactive Patient Education  2017 [employer].     Call the [place] office at [phone] if you have any questions, problems or concerns.

## 2019-09-03 ENCOUNTER — Other Ambulatory Visit: Payer: 59 | Admitting: *Deleted

## 2019-09-03 ENCOUNTER — Other Ambulatory Visit: Payer: Self-pay

## 2019-09-03 DIAGNOSIS — I712 Thoracic aortic aneurysm, without rupture, unspecified: Secondary | ICD-10-CM

## 2019-09-03 LAB — BASIC METABOLIC PANEL
BUN/Creatinine Ratio: 17 (ref 9–20)
BUN: 15 mg/dL (ref 6–24)
CO2: 23 mmol/L (ref 20–29)
Calcium: 9.2 mg/dL (ref 8.7–10.2)
Chloride: 101 mmol/L (ref 96–106)
Creatinine, Ser: 0.89 mg/dL (ref 0.76–1.27)
GFR calc Af Amer: 108 mL/min/{1.73_m2} (ref >59)
GFR calc non Af Amer: 94 mL/min/{1.73_m2} (ref >59)
Glucose: 92 mg/dL (ref 65–99)
Potassium: 3.9 mmol/L (ref 3.5–5.2)
Sodium: 138 mmol/L (ref 134–144)

## 2019-09-17 ENCOUNTER — Other Ambulatory Visit: Payer: Self-pay

## 2019-09-17 ENCOUNTER — Ambulatory Visit (INDEPENDENT_AMBULATORY_CARE_PROVIDER_SITE_OTHER)
Admission: RE | Admit: 2019-09-17 | Discharge: 2019-09-17 | Disposition: A | Discharge status: Home / Self Care | Payer: 59 | Source: Ambulatory Visit | Attending: Nurse Practitioner | Admitting: Nurse Practitioner

## 2019-09-17 DIAGNOSIS — I712 Thoracic aortic aneurysm, without rupture, unspecified: Secondary | ICD-10-CM

## 2019-09-17 MED ORDER — IOHEXOL 350 MG/ML SOLN
100.0000 mL | Freq: Once | INTRAVENOUS | Status: AC | PRN
  Administered 2019-09-17: 09:00:00 100 mL via INTRAVENOUS

## 2019-10-05 NOTE — Progress Notes (Signed)
Cardiology Office Note:    Date:  10/06/2019   ID:  Von, Lownes [birth year], MRN [id number]  PCP:  [name], MD  Cardiologist:  Sinclair Grooms, MD   Referring MD: [name], MD   Chief Complaint  Patient presents with  . Coronary Artery Disease  . Hyperlipidemia    History of Present Illness:    Andrew Mitchell is a 58 y.o. male with a hx of DOE, family h/o CAD (father with MI) , hyperlipidemia, inferior MI 2019 treated with RCA DES.  Denies angina.  Walking greater than 150 minutes/week.  No dyspnea on exertion or claudication.  No medication side effects but does ask when he will be able to get off of Brilinta.  He has not had blood in his urine or stool.  No transient neurological symptoms.  Past Medical History:  Diagnosis Date  . Back pain   . CAD (coronary artery disease)    a. 09/2018 Inf STEMI/PCI: LM nl, LAD min irregs, D1/2 min irregs, LCX 100ost/p thrombotic (2.75x35 Orsiro DES), RCA 40d, RPAV 90.  . Carpal tunnel syndrome   . DDD (degenerative disc disease)   . Diastolic dysfunction    a. 09/2018 Echo: EF 50-55%, prob inflat and antlat HK, Gr1 DD. Mildlly dil Ao root (43m). Nl RV fxn.  . Hyperlipemia   . Mildly Dilated aortic root ([place])    a. 09/2018 Echo: 2mm.  . Tubular adenocarcinoma ([place]) 2010   colon    Past Surgical History:  Procedure Laterality Date  . CARPAL TUNNEL RELEASE Left 01/06/2013   Procedure: CARPAL TUNNEL RELEASE;  Surgeon: Wynonia Sours, MD;  Location: [facility];  Service: Orthopedics;  Laterality: Left;  ANESTHESIA: IV REGIONAL FAB  . COLONOSCOPY    . CORONARY ANGIOGRAPHY N/A 10/08/2018   Procedure: CORONARY ANGIOGRAPHY;  Surgeon: Sherren Mocha, MD;  Location: [place] [facility];  Service: Cardiovascular;  Laterality: N/A;  . CORONARY STENT INTERVENTION N/A 10/08/2018   Procedure: CORONARY STENT INTERVENTION;  Surgeon: Sherren Mocha, MD;  Location: [facility];  Service: Cardiovascular;   Laterality: N/A;  . CORONARY/GRAFT ACUTE MI REVASCULARIZATION N/A 10/08/2018   Procedure: Coronary/Graft Acute MI Revascularization;  Surgeon: Sherren Mocha, MD;  Location: [place] [facility];  Service: Cardiovascular;  Laterality: N/A;  . KNEE ARTHROSCOPY     rightx2  . RIB RESECTION  2003   thorasic outlet syndrome-rt  . SHOULDER ARTHROSCOPY     rightx2  . TONSILLECTOMY      Current Medications: Current Meds  Medication Sig  . carbidopa-levodopa (SINEMET IR) 25-100 MG per tablet Take 1/2 to 1 tablet by mouth daily before bedtime.  Marland Kitchen ezetimibe (ZETIA) 10 MG tablet Take 1 tablet (10 mg total) by mouth daily.  Javier Docker Oil 1000 MG CAPS Take 1,000 mg by mouth daily.  . metoprolol tartrate (LOPRESSOR) 25 MG tablet Take 1 tablet (25 mg total) by mouth 2 (two) times daily.  . Multiple Vitamins-Minerals (CENTRUM PO) Take 1 tablet by mouth daily.  . nitroGLYCERIN (NITROSTAT) 0.4 MG SL tablet Place 1 tablet (0.4 mg total) under the tongue every 5 (five) minutes x 3 doses as needed for chest pain.  . rosuvastatin (CRESTOR) 5 MG tablet Take 1 tablet (5 mg total) by mouth daily.  . saw palmetto 160 MG capsule Take 320 mg by mouth daily.  . vitamin B-12 (CYANOCOBALAMIN) 1000 MCG tablet Take 3,000 mcg by mouth daily.   . vitamin C (ASCORBIC ACID)  500 MG tablet Take 500 mg by mouth daily.  . [DISCONTINUED] aspirin 81 MG tablet Take 81 mg by mouth daily.  . [DISCONTINUED] ticagrelor (BRILINTA) 90 MG TABS tablet Take 1 tablet (90 mg total) by mouth 2 (two) times daily.     Allergies:   Shellfish allergy   Social History   Socioeconomic History  . Marital status: Married    Spouse name: Not on file  . Number of children: Not on file  . Years of education: Not on file  . Highest education level: Not on file  Occupational History  . Not on file  Tobacco Use  . Smoking status: Never Smoker  . Smokeless tobacco: Never Used  Substance and Sexual Activity  . Alcohol use: Yes    Comment: occ    . Drug use: No  . Sexual activity: Not on file  Other Topics Concern  . Not on file  Social History Narrative  . Not on file   Social Determinants of Health   Financial Resource Strain:   . Difficulty of Paying Living Expenses: Not on file  Food Insecurity:   . Worried About Charity fundraiser in the Last Year: Not on file  . Ran Out of Food in the Last Year: Not on file  Transportation Needs:   . Lack of Transportation (Medical): Not on file  . Lack of Transportation (Non-Medical): Not on file  Physical Activity:   . Days of Exercise per Week: Not on file  . Minutes of Exercise per Session: Not on file  Stress:   . Feeling of Stress : Not on file  Social Connections:   . Frequency of Communication with Friends and Family: Not on file  . Frequency of Social Gatherings with Friends and Family: Not on file  . Attends Religious Services: Not on file  . Active Member of Clubs or Organizations: Not on file  . Attends Archivist Meetings: Not on file  . Marital Status: Not on file     Family History: The patient's family history is not on file.  ROS:   Please see the history of present illness.    Easy bruising.  Restless leg syndrome.  Sleeping better now that Vyvanse has been discontinued.  All other systems reviewed and are negative.  EKGs/Labs/Other Studies Reviewed:    The following studies were reviewed today:  Chest CT 09/17/2019: IMPRESSION: 1. Mild ectasia of the ascending thoracic aorta measuring 3.3 cm in AP diameter. No evidence of thoracic aortic aneurysm.  2.  Mild atherosclerotic coronary artery disease.   EKG:  EKG normal sinus rhythm, poor R wave progression V1 through V4.  Nonspecific ST abnormality.  Recent Labs: 07/06/2019: ALT 28; Hemoglobin 16.1; Platelets 235 09/03/2019: BUN 15; Creatinine, Ser 0.89; Potassium 3.9; Sodium 138  Recent Lipid Panel    Component Value Date/Time   CHOL 123 07/06/2019 0945   TRIG 68 07/06/2019 0945    HDL 52 07/06/2019 0945   CHOLHDL 2.4 07/06/2019 0945   CHOLHDL 3.3 10/09/2018 0028   VLDL 7 10/09/2018 0028   LDLCALC 57 07/06/2019 0945    Physical Exam:    VS:  BP 118/68   Pulse 62   Ht 5' 9.75" (1.772 m)   Wt 186 lb 1.9 oz (84.4 kg)   SpO2 95%   BMI 26.90 kg/m     Wt Readings from Last 3 Encounters:  10/06/19 186 lb 1.9 oz (84.4 kg)  07/06/19 175 lb 6.4 oz (79.6 kg)  12/25/18 184 lb 12.8 oz (83.8 kg)     GEN: Slender. No acute distress HEENT: Normal NECK: No JVD. LYMPHATICS: No lymphadenopathy CARDIAC:  RRR without murmur, gallop, or edema. VASCULAR:  Normal Pulses. No bruits. RESPIRATORY:  Clear to auscultation without rales, wheezing or rhonchi  ABDOMEN: Soft, non-tender, non-distended, No pulsatile mass, MUSCULOSKELETAL: No deformity  SKIN: Warm and dry NEUROLOGIC:  Alert and oriented x 3 PSYCHIATRIC:  Normal affect   ASSESSMENT:    1. Coronary artery disease involving native coronary artery of native heart with unstable angina pectoris ([place])   2. Hyperlipidemia, unspecified hyperlipidemia type   3. Essential hypertension   4. Thoracic aortic aneurysm without rupture ([place])   5. Educated about COVID-19 virus infection    PLAN:    In order of problems listed above:  1. Secondary prevention discussed.  Discontinue aspirin and Brilinta and start Plavix 75 mg/day. 2. Continue ezetimibe and rosuvastatin. 3. Continue target blood pressure less than 130/80 mmHg. 4. Does not have an aortic aneurysm.  Has aortic dilatation.  This probably needs to be reevaluated at some future point. 5. The 3W's discussed to avoid COVID-19 infection.  Overall education and awareness concerning primary/secondary risk prevention was discussed in detail: LDL less than 70, hemoglobin A1c less than 7, blood pressure target less than 130/80 mmHg, >150 minutes of moderate aerobic activity per week, avoidance of smoking, weight control (via diet and exercise), and continued  surveillance/management of/for obstructive sleep apnea.    Medication Adjustments/Labs and Tests Ordered: Current medicines are reviewed at length with the patient today.  Concerns regarding medicines are outlined above.  Orders Placed This Encounter  Procedures  . EKG 12-Lead   No orders of the defined types were placed in this encounter.   There are no Patient Instructions on file for this visit.   Signed, Sinclair Grooms, MD  10/06/2019 8:37 AM    [place]

## 2019-10-06 ENCOUNTER — Other Ambulatory Visit: Payer: Self-pay

## 2019-10-06 ENCOUNTER — Encounter: Payer: Self-pay | Admitting: Interventional Cardiology

## 2019-10-06 ENCOUNTER — Ambulatory Visit: Payer: 59 | Admitting: Interventional Cardiology

## 2019-10-06 VITALS — BP 118/68 | HR 62 | Ht 69.75 in | Wt 186.1 lb

## 2019-10-06 DIAGNOSIS — I25110 Atherosclerotic heart disease of native coronary artery with unstable angina pectoris: Secondary | ICD-10-CM | POA: Diagnosis not present

## 2019-10-06 DIAGNOSIS — I712 Thoracic aortic aneurysm, without rupture, unspecified: Secondary | ICD-10-CM

## 2019-10-06 DIAGNOSIS — Z7189 Other specified counseling: Secondary | ICD-10-CM

## 2019-10-06 DIAGNOSIS — E785 Hyperlipidemia, unspecified: Secondary | ICD-10-CM | POA: Diagnosis not present

## 2019-10-06 DIAGNOSIS — I10 Essential (primary) hypertension: Secondary | ICD-10-CM | POA: Diagnosis not present

## 2019-10-06 MED ORDER — CLOPIDOGREL BISULFATE 75 MG PO TABS: 75.0000 mg | ORAL_TABLET | Freq: Every day | ORAL | 3 refills | Status: DC

## 2019-10-06 NOTE — Patient Instructions (Signed)
Medication Instructions:  1) DISCONTINUE Aspirin 2) DISCONTINUE Brilinta 3) START Plavix (Clopidgrel) 75mg  once daily  *If you need a refill on your cardiac medications before your next appointment, please call your pharmacy*  Lab Work: None If you have labs (blood work) drawn today and your tests are completely normal, you will receive your results only by: Andrew Mitchell MyChart Message (if you have MyChart) OR . A paper copy in the mail If you have any lab test that is abnormal or we need to change your treatment, we will call you to review the results.  Testing/Procedures: None  Follow-Up: At [facility], you and your health needs are our priority.  As part of our continuing mission to provide you with exceptional heart care, we have created designated Provider Care Teams.  These Care Teams include your primary Cardiologist (physician) and Advanced Practice Providers (APPs -  Physician Assistants and Nurse Practitioners) who all work together to provide you with the care you need, when you need it.  Your next appointment:   12 month(s)  The format for your next appointment:   In Person  Provider:   You may see Sinclair Grooms, MD or one of the following Advanced Practice Providers on your designated Care Team:    Truitt Merle, NP  Cecilie Kicks, NP  Kathyrn Drown, NP   Other Instructions

## 2019-11-23 ENCOUNTER — Telehealth: Payer: Self-pay | Admitting: *Deleted

## 2019-11-23 NOTE — Telephone Encounter (Signed)
   [facility] Pre-operative Risk Assessment    Request for surgical clearance:  1. What type of surgery is being performed? COLONOSCOPY   2. When is this surgery scheduled? 12/15/19   3. What type of clearance is required (medical clearance vs. Pharmacy clearance to hold med vs. Both)? MEDICAL  4. Are there any medications that need to be held prior to surgery and how long? PLAVIX   5. Practice name and name of physician performing surgery? [facility]; DR. Penelope Coop   6. What is your office phone number [phone]    7.   What is your office fax number [phone]  8.   Anesthesia type (None, local, MAC, general) ? NOT LISTED   Andrew Mitchell 11/23/2019, 10:12 AM  _________________________________________________________________   (provider comments below)

## 2019-11-23 NOTE — Telephone Encounter (Signed)
Hi Dr. Tamala Julian,  Andrew Mitchell is scheduled for a colonoscopy on 12/15/2019. Can you please comment on how long patient can hold Plavix for? He has a history of CAD with STEMI in 09/2018 s/p DES to CX and hyperlipidemia. You recently saw him on 10/06/2019 at which time patient was doing well. Aspirin and Brilinta were stopped at that visit and patient was started on Plavix.  Please route response back to P [facility] PREOP.  Thank you! Loyola Santino

## 2019-11-23 NOTE — Telephone Encounter (Signed)
Okay to hold plavix for 5 days prior to Colonoscopy

## 2019-11-24 NOTE — Telephone Encounter (Signed)
   Primary Cardiologist: Sinclair Grooms, MD  Chart reviewed as part of pre-operative protocol coverage. Patient was last seen by Dr. Tamala Julian on 10/06/2019 at which time he was doing well from a cardiac standpoint. Patient was contacted today for pre-operative risk assessment and reported he has continued to do well since last visit. No chest pain, shortness of breath, palpitations, lightheadedness, dizziness, syncope, orthopnea, or edema. Able to complete >4.0 METS without any anginal symptoms.   Given past medical history and time since last visit, based on ACC/AHA guidelines, Andrew Mitchell would be at acceptable risk for the planned procedure without further cardiovascular testing.   Per Dr. Tamala Julian, Andrew Mitchell to hold Plavix for 5 days prior to colonoscopy. This should be restarted as soon as able following procedure.   I will route this recommendation to the requesting party via Epic fax function and remove from pre-op pool.  Please call with questions.  Darreld Mclean, PA-C 11/24/2019, 8:53 AM

## 2019-12-30 ENCOUNTER — Other Ambulatory Visit: Payer: Self-pay | Admitting: Interventional Cardiology

## 2020-02-26 ENCOUNTER — Other Ambulatory Visit: Payer: Self-pay | Admitting: Interventional Cardiology

## 2020-06-08 NOTE — Progress Notes (Signed)
[facility] Note  06/13/2020     CHIEF COMPLAINT Patient presents for Retina Evaluation   HISTORY OF PRESENT ILLNESS: Andrew Mitchell is a 59 y.o. male who presents to the clinic today for:   HPI    Retina Evaluation    In both eyes.  This started 3 months ago.  Duration of 3 months.  Context:  distance vision and near vision.  I, the attending physician,  performed the HPI with the patient and updated documentation appropriately.          Comments    CEIOL  OU (Dr. [name] 2019)   Pt states he has noticed a decrease in his overall vision in the last 3 months.  Patient states things are just not clear.  Patient denies eye pain or discomfort and denies any new or worsening floaters or fol OU.  Patient recently used: Durezol BID OU (Rx'd by Dr. Corliss Parish not used within last couple of days Bromsite BID OU (Rx'd by Dr. Cloria Spring not used within the last couple of days Latanoprost QHS OU (Rx'd by Dr. Rosanne Sack Tmax of 28,29 per patient)--not using any longer       Last edited by [name], MD on 06/13/2020  9:16 AM. (History)    pt is here on the referral of Dr. [name] for concern of CME OU, pt last saw him 2 weeks ago, Dr. [name] put him on Durezol and Bromsite BID OU, but pt stopped both drops a couple days ago because he felt like his vision was okay, pt was give latanoprost by Dr. [place], but is not taking it anymore because he felt like it was causing him to not be able to see very well, pt has had cat sx OU with Dr. [name] (2019), he has also had a laser procedure in both eyes to break up floaters a couple months after cat sx, pt denies being diabetic, but states he does take medication for high blood pressure, he denies fol  Referring physician: [name], MD No address on file  HISTORICAL INFORMATION:   Selected notes from the MEDICAL RECORD NUMBER Referred by Dr. [name] for eval of CME     CURRENT MEDICATIONS: Current Outpatient Medications (Ophthalmic Drugs)  Medication Sig  . Bromfenac Sodium (PROLENSA) 0.07 % SOLN Place 1 drop into the right eye 3 (three) times daily.  . Difluprednate (DUREZOL) 0.05 % EMUL Place 1 drop into the right eye 3 (three) times daily.   No current facility-administered medications for this visit. (Ophthalmic Drugs)   Current Outpatient Medications (Other)  Medication Sig  . carbidopa-levodopa (SINEMET IR) 25-100 MG per tablet Take 1/2 to 1 tablet by mouth daily before bedtime.  . clopidogrel (PLAVIX) 75 MG tablet Take 1 tablet (75 mg total) by mouth daily.  Marland Kitchen ezetimibe (ZETIA) 10 MG tablet TAKE 1 TABLET BY MOUTH EVERY DAY  . Krill Oil 1000 MG CAPS Take 1,000 mg by mouth daily.  . metoprolol tartrate (LOPRESSOR) 25 MG tablet TAKE 1 TABLET BY MOUTH TWICE A DAY  . Multiple Vitamins-Minerals (CENTRUM PO) Take 1 tablet by mouth daily.  . nitroGLYCERIN (NITROSTAT) 0.4 MG SL tablet Place 1 tablet (0.4 mg total) under the tongue every 5 (five) minutes x 3 doses as needed for chest pain.  . rosuvastatin (CRESTOR) 5 MG tablet TAKE 1 TABLET BY MOUTH EVERY DAY  . saw palmetto 160 MG capsule Take 320 mg by mouth daily.  . vitamin B-12 (CYANOCOBALAMIN)  1000 MCG tablet Take 3,000 mcg by mouth daily.   . vitamin C (ASCORBIC ACID) 500 MG tablet Take 500 mg by mouth daily.   No current facility-administered medications for this visit. (Other)      REVIEW OF SYSTEMS: ROS    Positive for: Cardiovascular, Eyes   Negative for: Constitutional, Gastrointestinal, Neurological, Skin, Genitourinary, Musculoskeletal, HENT, Endocrine, Respiratory, Psychiatric, Allergic/Imm, Heme/Lymph   Last edited by Doneen Poisson on 06/13/2020  8:59 AM. (History)       ALLERGIES Allergies  Allergen Reactions  . Shellfish Allergy Anaphylaxis    Just to shrimp    PAST MEDICAL HISTORY Past Medical History:  Diagnosis Date  . Back pain   . CAD (coronary artery  disease)    a. 09/2018 Inf STEMI/PCI: LM nl, LAD min irregs, D1/2 min irregs, LCX 100ost/p thrombotic (2.75x35 Orsiro DES), RCA 40d, RPAV 90.  . Carpal tunnel syndrome   . DDD (degenerative disc disease)   . Diastolic dysfunction    a. 09/2018 Echo: EF 50-55%, prob inflat and antlat HK, Gr1 DD. Mildlly dil Ao root (87m). Nl RV fxn.  . Hyperlipemia   . Mildly Dilated aortic root ([place])    a. 09/2018 Echo: 47mm.  . Tubular adenocarcinoma ([place]) 2010   colon   Past Surgical History:  Procedure Laterality Date  . CARPAL TUNNEL RELEASE Left 01/06/2013   Procedure: CARPAL TUNNEL RELEASE;  Surgeon: Wynonia Sours, MD;  Location: [facility];  Service: Orthopedics;  Laterality: Left;  ANESTHESIA: IV REGIONAL FAB  . COLONOSCOPY    . CORONARY ANGIOGRAPHY N/A 10/08/2018   Procedure: CORONARY ANGIOGRAPHY;  Surgeon: Sherren Mocha, MD;  Location: [facility];  Service: Cardiovascular;  Laterality: N/A;  . CORONARY STENT INTERVENTION N/A 10/08/2018   Procedure: CORONARY STENT INTERVENTION;  Surgeon: Sherren Mocha, MD;  Location: [facility];  Service: Cardiovascular;  Laterality: N/A;  . CORONARY/GRAFT ACUTE MI REVASCULARIZATION N/A 10/08/2018   Procedure: Coronary/Graft Acute MI Revascularization;  Surgeon: Sherren Mocha, MD;  Location: [facility];  Service: Cardiovascular;  Laterality: N/A;  . KNEE ARTHROSCOPY     rightx2  . RIB RESECTION  2003   thorasic outlet syndrome-rt  . SHOULDER ARTHROSCOPY     rightx2  . TONSILLECTOMY      FAMILY HISTORY History reviewed. No pertinent family history.  SOCIAL HISTORY Social History   Tobacco Use  . Smoking status: Never Smoker  . Smokeless tobacco: Never Used  Vaping Use  . Vaping Use: Never used  Substance Use Topics  . Alcohol use: Yes    Comment: occ  . Drug use: No         OPHTHALMIC EXAM:  Base Eye Exam    Visual Acuity (Snellen - Linear)      Right Left   Dist [place] 20/30 -2 20/25 -2   Dist  ph [place] 20/30 +2 NI       Tonometry (Tonopen, 8:56 AM)      Right Left   Pressure 14 16       Pupils      Dark Light Shape React APD   Right 3 2 Round Brisk 0   Left 3 2 Round Brisk 0       Visual Fields      Left Right    Full Full       Extraocular Movement      Right Left    Full Full  Neuro/Psych    Oriented x3: Yes   Mood/Affect: Normal       Dilation    Both eyes: 1.0% Mydriacyl, 2.5% Phenylephrine @ 8:56 AM        Slit Lamp and Fundus Exam    Slit Lamp Exam      Right Left   Lids/Lashes Dermatochalasis - upper lid Dermatochalasis - upper lid   Conjunctiva/Sclera White and quiet White and quiet   Cornea Mild arcus, trace Punctate epithelial erosions, trace Debris in tear film, well healed temporal cataract wounds Mild arcus, trace Punctate epithelial erosions, trace Debris in tear film, well healed temporal cataract wounds   Anterior Chamber Deep, 0.5+cell/pigment Deep and quiet   Iris Round and dilated Round and dilated   Lens MF PC IOL in perfect position with open PC MF PC IOL in perfect position with open PC   Vitreous Vitreous syneresis, Posterior vitreous detachment, vitreous condensations Vitreous syneresis, Posterior vitreous detachment, vitreous condensations       Fundus Exam      Right Left   Disc Pink and Sharp, Compact Pink and Sharp   C/D Ratio 0.4 0.5   Macula Flat, Blunted foveal reflex, trace Cystic changes, mild RPE mottling and clumping Blunted foveal reflex, RPE mottling and clumping, No heme or edema   Vessels mild attenuation, mild tortuousity mild attenuation, mild tortuousity   Periphery Attached, No heme, No RT/RD Attached, No heme, No RT/RD        Refraction    Manifest Refraction      Sphere Cylinder Dist VA   Right -0.25 Sphere 20/20-1   Left -0.50 Sphere 20/20-1          IMAGING AND PROCEDURES  Imaging and Procedures for 06/13/2020  OCT, Retina - OU - Both Eyes       Right Eye Quality was good. Central  Foveal Thickness: 365. Progression has no prior data. Findings include normal foveal contour, intraretinal fluid, no SRF, vitreomacular adhesion .   Left Eye Quality was good. Central Foveal Thickness: 323. Progression has no prior data. Findings include normal foveal contour, no IRF, no SRF, vitreomacular adhesion .   Notes *Images captured and stored on drive  Diagnosis / Impression:  OD: trace CME OS: NFP, no IRF/SRF  Clinical management:  See below  Abbreviations: NFP - Normal foveal profile. CME - cystoid macular edema. PED - pigment epithelial detachment. IRF - intraretinal fluid. SRF - subretinal fluid. EZ - ellipsoid zone. ERM - epiretinal membrane. ORA - outer retinal atrophy. ORT - outer retinal tubulation. SRHM - subretinal hyper-reflective material. IRHM - intraretinal hyper-reflective material                 ASSESSMENT/PLAN:    ICD-10-CM   1. Cystoid macular edema of right eye  H35.351   2. Retinal edema  H35.81 OCT, Retina - OU - Both Eyes  3. Essential hypertension  I10   4. Hypertensive retinopathy of both eyes  H35.033   5. Pseudophakia, both eyes  Z96.1     1,2. CME OD  - initially diagnosed w/ CME OU by Dr. [name] on 8.16.21 and started on Durezol and BromSite TID OU  - pt reports self d/c of drops 2-3 days ago due to improvement in vision  - today on exam, BCVA 20/30 OD and 20/25 OS  - OCT shows interval improvement in CME OU -- OD with mild residual CME/IRF, OS without CME  - recommend restarting Durezol and Bromsite/Prolensa TID OD only  -  f/u 3 weeks, DFE, OCT  3,4. Hypertensive retinopathy OU - discussed importance of tight BP control - monitor  5. Pseudophakia OU  - s/p CE/IOL OU 2019  - multifocal IOLs in perfect position, doing well  - monitor   Ophthalmic Meds Ordered this visit:  Meds ordered this encounter  Medications  . Difluprednate (DUREZOL) 0.05 % EMUL    Sig: Place 1 drop into the right eye 3 (three) times daily.     Dispense:  5 mL    Refill:  1  . Bromfenac Sodium (PROLENSA) 0.07 % SOLN    Sig: Place 1 drop into the right eye 3 (three) times daily.    Dispense:  3 mL    Refill:  2       Return in about 3 weeks (around 07/04/2020) for f/u CME OD, DFE, OCT.  There are no Patient Instructions on file for this visit.   Explained the diagnoses, plan, and follow up with the patient and they expressed understanding.  Patient expressed understanding of the importance of proper follow up care.  This document serves as a record of services personally performed by Gardiner Sleeper, MD, PhD. It was created on their behalf by Estill Bakes, COT an ophthalmic technician. The creation of this record is the provider's dictation and/or activities during the visit.    Electronically signed by: Estill Bakes, COT 8.26.21 @ 5:43 PM   This document serves as a record of services personally performed by Gardiner Sleeper, MD, PhD. It was created on their behalf by San Jetty. Owens Shark, OA an ophthalmic technician. The creation of this record is the provider's dictation and/or activities during the visit.    Electronically signed by: San Jetty. Owens Shark, [place] 08.31.2021 5:43 PM   Gardiner Sleeper, M.D., Ph.D. Diseases & Surgery of the Retina and Vitreous [facility]  I have reviewed the above documentation for accuracy and completeness, and I agree with the above. Gardiner Sleeper, M.D., Ph.D. 06/13/20 5:43 PM   Abbreviations: M myopia (nearsighted); A astigmatism; H hyperopia (farsighted); P presbyopia; Mrx spectacle prescription;  CTL contact lenses; OD right eye; OS left eye; OU both eyes  XT exotropia; ET esotropia; PEK punctate epithelial keratitis; PEE punctate epithelial erosions; DES dry eye syndrome; MGD meibomian gland dysfunction; ATs artificial tears; PFAT's preservative free artificial tears; [place] nuclear sclerotic cataract; PSC posterior subcapsular cataract; ERM epi-retinal membrane; PVD  posterior vitreous detachment; RD retinal detachment; DM diabetes mellitus; DR diabetic retinopathy; NPDR non-proliferative diabetic retinopathy; PDR proliferative diabetic retinopathy; CSME clinically significant macular edema; DME diabetic macular edema; dbh dot blot hemorrhages; CWS cotton wool spot; POAG primary open angle glaucoma; C/D cup-to-disc ratio; HVF humphrey visual field; GVF goldmann visual field; OCT optical coherence tomography; IOP intraocular pressure; BRVO Branch retinal vein occlusion; CRVO central retinal vein occlusion; CRAO central retinal artery occlusion; BRAO branch retinal artery occlusion; RT retinal tear; SB scleral buckle; PPV pars plana vitrectomy; VH Vitreous hemorrhage; PRP panretinal laser photocoagulation; IVK intravitreal kenalog; VMT vitreomacular traction; MH Macular hole;  NVD neovascularization of the disc; NVE neovascularization elsewhere; AREDS age related eye disease study; ARMD age related macular degeneration; POAG primary open angle glaucoma; EBMD epithelial/anterior basement membrane dystrophy; ACIOL anterior chamber intraocular lens; IOL intraocular lens; PCIOL posterior chamber intraocular lens; Phaco/IOL phacoemulsification with intraocular lens placement; [place] photorefractive keratectomy; LASIK laser assisted in situ keratomileusis; HTN hypertension; DM diabetes mellitus; COPD chronic obstructive pulmonary disease

## 2020-06-13 ENCOUNTER — Encounter (INDEPENDENT_AMBULATORY_CARE_PROVIDER_SITE_OTHER): Payer: Self-pay | Admitting: Ophthalmology

## 2020-06-13 ENCOUNTER — Other Ambulatory Visit: Payer: Self-pay

## 2020-06-13 ENCOUNTER — Other Ambulatory Visit (INDEPENDENT_AMBULATORY_CARE_PROVIDER_SITE_OTHER): Payer: Self-pay | Admitting: Ophthalmology

## 2020-06-13 ENCOUNTER — Ambulatory Visit (INDEPENDENT_AMBULATORY_CARE_PROVIDER_SITE_OTHER): Payer: 59 | Admitting: Ophthalmology

## 2020-06-13 DIAGNOSIS — H35351 Cystoid macular degeneration, right eye: Secondary | ICD-10-CM

## 2020-06-13 DIAGNOSIS — H35033 Hypertensive retinopathy, bilateral: Secondary | ICD-10-CM | POA: Diagnosis not present

## 2020-06-13 DIAGNOSIS — I10 Essential (primary) hypertension: Secondary | ICD-10-CM

## 2020-06-13 DIAGNOSIS — H3581 Retinal edema: Secondary | ICD-10-CM | POA: Diagnosis not present

## 2020-06-13 DIAGNOSIS — Z961 Presence of intraocular lens: Secondary | ICD-10-CM

## 2020-06-13 MED ORDER — PROLENSA 0.07 % OP SOLN: 1.0000 [drp] | Freq: Three times a day (TID) | OPHTHALMIC | 2 refills | Status: DC

## 2020-06-13 MED ORDER — DUREZOL 0.05 % OP EMUL: 1.0000 [drp] | Freq: Three times a day (TID) | OPHTHALMIC | 1 refills | Status: DC

## 2020-06-20 ENCOUNTER — Other Ambulatory Visit (INDEPENDENT_AMBULATORY_CARE_PROVIDER_SITE_OTHER): Payer: Self-pay

## 2020-06-20 MED ORDER — PROLENSA 0.07 % OP SOLN: 1.0000 [drp] | Freq: Three times a day (TID) | OPHTHALMIC | 0 refills | Status: DC

## 2020-06-30 NOTE — Progress Notes (Signed)
[facility] Note  07/05/2020     CHIEF COMPLAINT Patient presents for Retina Follow Up   HISTORY OF PRESENT ILLNESS: Andrew Mitchell is a 59 y.o. male who presents to the clinic today for:   HPI    Retina Follow Up    Patient presents with  Other.  In right eye.  This started weeks ago.  Severity is moderate.  Duration of weeks.  Since onset it is stable.  I, the attending physician,  performed the HPI with the patient and updated documentation appropriately.          Comments    Pt states vision is pretty good OU.  Patient denies eye pain or discomfort and denies any new or worsening floaters or fol OU.       Last edited by [name], MD on 07/05/2020 11:34 AM. (History)    pt states he has been using Durezol and Prolensa TID OD, he states he feels like his vision is better   Referring physician: [name], MD No address on file  HISTORICAL INFORMATION:   Selected notes from the [place] Referred by Dr. [name] for eval of CME    CURRENT MEDICATIONS: Current Outpatient Medications (Ophthalmic Drugs)  Medication Sig  . Bromfenac Sodium (PROLENSA) 0.07 % SOLN Place 1 drop into the right eye 3 (three) times daily.  . Bromfenac Sodium (PROLENSA) 0.07 % SOLN Place 1 drop into the right eye 3 (three) times daily.  . Difluprednate (DUREZOL) 0.05 % EMUL Place 1 drop into the right eye 3 (three) times daily.   No current facility-administered medications for this visit. (Ophthalmic Drugs)   Current Outpatient Medications (Other)  Medication Sig  . carbidopa-levodopa (SINEMET IR) 25-100 MG per tablet Take 1/2 to 1 tablet by mouth daily before bedtime.  . clopidogrel (PLAVIX) 75 MG tablet Take 1 tablet (75 mg total) by mouth daily.  Marland Kitchen ezetimibe (ZETIA) 10 MG tablet TAKE 1 TABLET BY MOUTH EVERY DAY  . Krill Oil 1000 MG CAPS Take 1,000 mg by mouth daily.  . metoprolol tartrate (LOPRESSOR) 25 MG tablet TAKE 1 TABLET BY  MOUTH TWICE A DAY  . Multiple Vitamins-Minerals (CENTRUM PO) Take 1 tablet by mouth daily.  . nitroGLYCERIN (NITROSTAT) 0.4 MG SL tablet Place 1 tablet (0.4 mg total) under the tongue every 5 (five) minutes x 3 doses as needed for chest pain.  . rosuvastatin (CRESTOR) 5 MG tablet TAKE 1 TABLET BY MOUTH EVERY DAY  . saw palmetto 160 MG capsule Take 320 mg by mouth daily.  . vitamin B-12 (CYANOCOBALAMIN) 1000 MCG tablet Take 3,000 mcg by mouth daily.   . vitamin C (ASCORBIC ACID) 500 MG tablet Take 500 mg by mouth daily.   No current facility-administered medications for this visit. (Other)      REVIEW OF SYSTEMS: ROS    Positive for: Cardiovascular, Eyes   Negative for: Constitutional, Gastrointestinal, Neurological, Skin, Genitourinary, Musculoskeletal, HENT, Endocrine, Respiratory, Psychiatric, Allergic/Imm, Heme/Lymph   Last edited by Doneen Poisson on 07/05/2020  9:10 AM. (History)       ALLERGIES Allergies  Allergen Reactions  . Shellfish Allergy Anaphylaxis    Just to shrimp    PAST MEDICAL HISTORY Past Medical History:  Diagnosis Date  . Back pain   . CAD (coronary artery disease)    a. 09/2018 Inf STEMI/PCI: LM nl, LAD min irregs, D1/2 min irregs, LCX 100ost/p thrombotic (2.75x35 Orsiro DES), RCA 40d, RPAV 90.  Marland Kitchen  Carpal tunnel syndrome   . DDD (degenerative disc disease)   . Diastolic dysfunction    a. 09/2018 Echo: EF 50-55%, prob inflat and antlat HK, Gr1 DD. Mildlly dil Ao root (2m). Nl RV fxn.  . Hyperlipemia   . Mildly Dilated aortic root ([place])    a. 09/2018 Echo: 53mm.  . Tubular adenocarcinoma ([place]) 2010   colon   Past Surgical History:  Procedure Laterality Date  . CARPAL TUNNEL RELEASE Left 01/06/2013   Procedure: CARPAL TUNNEL RELEASE;  Surgeon: Wynonia Sours, MD;  Location: [facility];  Service: Orthopedics;  Laterality: Left;  ANESTHESIA: IV REGIONAL FAB  . COLONOSCOPY    . CORONARY ANGIOGRAPHY N/A 10/08/2018   Procedure: CORONARY  ANGIOGRAPHY;  Surgeon: Sherren Mocha, MD;  Location: [facility];  Service: Cardiovascular;  Laterality: N/A;  . CORONARY STENT INTERVENTION N/A 10/08/2018   Procedure: CORONARY STENT INTERVENTION;  Surgeon: Sherren Mocha, MD;  Location: [facility];  Service: Cardiovascular;  Laterality: N/A;  . CORONARY/GRAFT ACUTE MI REVASCULARIZATION N/A 10/08/2018   Procedure: Coronary/Graft Acute MI Revascularization;  Surgeon: Sherren Mocha, MD;  Location: [facility];  Service: Cardiovascular;  Laterality: N/A;  . KNEE ARTHROSCOPY     rightx2  . RIB RESECTION  2003   thorasic outlet syndrome-rt  . SHOULDER ARTHROSCOPY     rightx2  . TONSILLECTOMY      FAMILY HISTORY History reviewed. No pertinent family history.  SOCIAL HISTORY Social History   Tobacco Use  . Smoking status: Never Smoker  . Smokeless tobacco: Never Used  Vaping Use  . Vaping Use: Never used  Substance Use Topics  . Alcohol use: Yes    Comment: occ  . Drug use: No         OPHTHALMIC EXAM:  Base Eye Exam    Visual Acuity (Snellen - Linear)      Right Left   Dist [place] 20/25 -1 20/25 -1   Dist ph [place] 20/20 -2        Tonometry (Tonopen, 9:13 AM)      Right Left   Pressure 21 21       Pupils      Dark Light Shape React APD   Right 3 2 Round Brisk 0   Left 3 2 Round Brisk 0       Visual Fields      Left Right    Full Full       Extraocular Movement      Right Left    Full Full       Neuro/Psych    Oriented x3: Yes   Mood/Affect: Normal       Dilation    Both eyes: 1.0% Mydriacyl, 2.5% Phenylephrine @ 9:13 AM        Slit Lamp and Fundus Exam    Slit Lamp Exam      Right Left   Lids/Lashes Dermatochalasis - upper lid Dermatochalasis - upper lid   Conjunctiva/Sclera White and quiet White and quiet   Cornea Mild arcus, trace Punctate epithelial erosions, trace Debris in tear film, well healed temporal cataract wounds Mild arcus, trace Punctate epithelial erosions,  trace Debris in tear film, well healed temporal cataract wounds   Anterior Chamber Deep and quiet Deep and quiet   Iris Round and dilated Round and dilated   Lens MF PC IOL in perfect position with open PC MF PC IOL in perfect position with open PC   Vitreous  Vitreous syneresis, Posterior vitreous detachment, vitreous condensations Vitreous syneresis, Posterior vitreous detachment, vitreous condensations       Fundus Exam      Right Left   Disc Pink and Sharp, Compact Pink and Sharp   C/D Ratio 0.4 0.5   Macula Flat, good foveal reflex, trace, persistent Cystic changes, mild RPE mottling and clumping Flat, good foveal reflex, RPE mottling and clumping, No heme or edema   Vessels mild attenuation, mild tortuousity mild attenuation, mild tortuousity   Periphery Attached, No heme, No RT/RD Attached, No heme, No RT/RD          IMAGING AND PROCEDURES  Imaging and Procedures for 07/05/2020  OCT, Retina - OU - Both Eyes       Right Eye Quality was good. Central Foveal Thickness: 359. Progression has been stable. Findings include normal foveal contour, intraretinal fluid, no SRF, vitreomacular adhesion  (Persistent cystic changes).   Left Eye Quality was good. Central Foveal Thickness: 347. Progression has been stable. Findings include normal foveal contour, no IRF, no SRF, vitreomacular adhesion .   Notes *Images captured and stored on drive  Diagnosis / Impression:  OD: trace CME -- Persistent cystic changes OS: NFP, no IRF/SRF  Clinical management:  See below  Abbreviations: NFP - Normal foveal profile. CME - cystoid macular edema. PED - pigment epithelial detachment. IRF - intraretinal fluid. SRF - subretinal fluid. EZ - ellipsoid zone. ERM - epiretinal membrane. ORA - outer retinal atrophy. ORT - outer retinal tubulation. SRHM - subretinal hyper-reflective material. IRHM - intraretinal hyper-reflective material                 ASSESSMENT/PLAN:    ICD-10-CM   1.  Cystoid macular edema of right eye  H35.351   2. Retinal edema  H35.81 OCT, Retina - OU - Both Eyes  3. Essential hypertension  I10   4. Hypertensive retinopathy of both eyes  H35.033   5. Pseudophakia, both eyes  Z96.1     1,2. CME OD  - initially diagnosed w/ CME OU by Dr. [name] on 08.16.21 and started on Durezol and BromSite TID OU  - pt reports self d/c of drops 2-3 days prior to initial visit due to improvement in vision  - today BCVA 20/20 OD (improved) and 20/25 OS  - OCT shows OD with mild residual CME/IRF -- persistent, OS without CME  - increase Durezol and Bromsite/Prolensa QID OD only  - f/u 3-4 weeks, DFE, OCT  3,4. Hypertensive retinopathy OU - discussed importance of tight BP control - monitor  5. Pseudophakia OU  - s/p CE/IOL OU 2019  - multifocal IOLs in perfect position, doing well  - monitor   Ophthalmic Meds Ordered this visit:  Meds ordered this encounter  Medications  . Bromfenac Sodium (PROLENSA) 0.07 % SOLN    Sig: Place 1 drop into the right eye 3 (three) times daily.    Dispense:  3 mL    Refill:  2       Return for f/u 3-4 weeks, CME OD, DFE, OCT.  There are no Patient Instructions on file for this visit.   Explained the diagnoses, plan, and follow up with the patient and they expressed understanding.  Patient expressed understanding of the importance of proper follow up care.  This document serves as a record of services personally performed by Gardiner Sleeper, MD, PhD. It was created on their behalf by Roselee Nova, COMT. The creation of this record is the provider's  dictation and/or activities during the visit.  Electronically signed by: Roselee Nova, COMT 07/05/20 11:37 AM   This document serves as a record of services personally performed by Gardiner Sleeper, MD, PhD. It was created on their behalf by San Jetty. Owens Shark, OA an ophthalmic technician. The creation of this record is the provider's dictation and/or activities during the  visit.    Electronically signed by: San Jetty. Owens Shark, [place] 09.22.2021 11:37 AM   Gardiner Sleeper, M.D., Ph.D. Diseases & Surgery of the Retina and Vitreous [facility]  I have reviewed the above documentation for accuracy and completeness, and I agree with the above. Gardiner Sleeper, M.D., Ph.D. 07/05/20 11:37 AM    Abbreviations: M myopia (nearsighted); A astigmatism; H hyperopia (farsighted); P presbyopia; Mrx spectacle prescription;  CTL contact lenses; OD right eye; OS left eye; OU both eyes  XT exotropia; ET esotropia; PEK punctate epithelial keratitis; PEE punctate epithelial erosions; DES dry eye syndrome; MGD meibomian gland dysfunction; ATs artificial tears; PFAT's preservative free artificial tears; [place] nuclear sclerotic cataract; PSC posterior subcapsular cataract; ERM epi-retinal membrane; PVD posterior vitreous detachment; RD retinal detachment; DM diabetes mellitus; DR diabetic retinopathy; NPDR non-proliferative diabetic retinopathy; PDR proliferative diabetic retinopathy; CSME clinically significant macular edema; DME diabetic macular edema; dbh dot blot hemorrhages; CWS cotton wool spot; POAG primary open angle glaucoma; C/D cup-to-disc ratio; HVF humphrey visual field; GVF goldmann visual field; OCT optical coherence tomography; IOP intraocular pressure; BRVO Branch retinal vein occlusion; CRVO central retinal vein occlusion; CRAO central retinal artery occlusion; BRAO branch retinal artery occlusion; RT retinal tear; SB scleral buckle; PPV pars plana vitrectomy; VH Vitreous hemorrhage; PRP panretinal laser photocoagulation; IVK intravitreal kenalog; VMT vitreomacular traction; MH Macular hole;  NVD neovascularization of the disc; NVE neovascularization elsewhere; AREDS age related eye disease study; ARMD age related macular degeneration; POAG primary open angle glaucoma; EBMD epithelial/anterior basement membrane dystrophy; ACIOL anterior chamber intraocular lens;  IOL intraocular lens; PCIOL posterior chamber intraocular lens; Phaco/IOL phacoemulsification with intraocular lens placement; [place] photorefractive keratectomy; LASIK laser assisted in situ keratomileusis; HTN hypertension; DM diabetes mellitus; COPD chronic obstructive pulmonary disease

## 2020-07-05 ENCOUNTER — Ambulatory Visit (INDEPENDENT_AMBULATORY_CARE_PROVIDER_SITE_OTHER): Payer: 59 | Admitting: Ophthalmology

## 2020-07-05 ENCOUNTER — Encounter (INDEPENDENT_AMBULATORY_CARE_PROVIDER_SITE_OTHER): Payer: Self-pay | Admitting: Ophthalmology

## 2020-07-05 ENCOUNTER — Other Ambulatory Visit: Payer: Self-pay

## 2020-07-05 DIAGNOSIS — H35351 Cystoid macular degeneration, right eye: Secondary | ICD-10-CM | POA: Diagnosis not present

## 2020-07-05 DIAGNOSIS — H3581 Retinal edema: Secondary | ICD-10-CM

## 2020-07-05 DIAGNOSIS — I10 Essential (primary) hypertension: Secondary | ICD-10-CM | POA: Diagnosis not present

## 2020-07-05 DIAGNOSIS — Z961 Presence of intraocular lens: Secondary | ICD-10-CM

## 2020-07-05 DIAGNOSIS — H35033 Hypertensive retinopathy, bilateral: Secondary | ICD-10-CM

## 2020-07-05 MED ORDER — PROLENSA 0.07 % OP SOLN: 1.0000 [drp] | Freq: Three times a day (TID) | OPHTHALMIC | 2 refills | Status: DC

## 2020-07-17 ENCOUNTER — Telehealth: Payer: Self-pay | Admitting: Interventional Cardiology

## 2020-07-17 DIAGNOSIS — E785 Hyperlipidemia, unspecified: Secondary | ICD-10-CM

## 2020-07-17 DIAGNOSIS — R739 Hyperglycemia, unspecified: Secondary | ICD-10-CM

## 2020-07-17 NOTE — Telephone Encounter (Signed)
Spoke with pt and scheduled labs to be done 10/12 since it has already been over a year since they were drawn and pt preferred to do now.

## 2020-07-17 NOTE — Telephone Encounter (Signed)
Per appointment notes, patient needs orders (Lipid, Liver, A1C) for lab work prior to or same day as appointment with Dr. Tamala Julian on 11/07/19.

## 2020-07-25 ENCOUNTER — Other Ambulatory Visit: Payer: Self-pay

## 2020-07-25 ENCOUNTER — Other Ambulatory Visit: Payer: 59 | Admitting: *Deleted

## 2020-07-25 DIAGNOSIS — R739 Hyperglycemia, unspecified: Secondary | ICD-10-CM

## 2020-07-25 DIAGNOSIS — E785 Hyperlipidemia, unspecified: Secondary | ICD-10-CM

## 2020-07-25 NOTE — Progress Notes (Signed)
[facility] Note  07/26/2020     CHIEF COMPLAINT Patient presents for Retina Follow Up   HISTORY OF PRESENT ILLNESS: Andrew Mitchell is a 59 y.o. male who presents to the clinic today for:   HPI    Retina Follow Up    Patient presents with  Other.  In right eye.  This started 3 weeks ago.  I, the attending physician,  performed the HPI with the patient and updated documentation appropriately.          Comments    Patient here for 3 weeks retina follow up for CME OD. Patient states vision doing pretty good. Eyes feel tired. No eye pain.       Last edited by [name], MD on 07/27/2020 11:33 PM. (History)    pt states he is using both drops QID OU, he states he still has a large floater in his center vision that is bothering him  Referring physician: [name], MD [place],   [id number]  HISTORICAL INFORMATION:   Selected notes from the [facility] Referred by Dr. [name] for eval of CME    CURRENT MEDICATIONS: Current Outpatient Medications (Ophthalmic Drugs)  Medication Sig  . Bromfenac Sodium (PROLENSA) 0.07 % SOLN Place 1 drop into the right eye 3 (three) times daily.  . Bromfenac Sodium (PROLENSA) 0.07 % SOLN Place 1 drop into the right eye 4 (four) times daily.  . Difluprednate (DUREZOL) 0.05 % EMUL Place 1 drop into the right eye 4 (four) times daily.   No current facility-administered medications for this visit. (Ophthalmic Drugs)   Current Outpatient Medications (Other)  Medication Sig  . carbidopa-levodopa (SINEMET IR) 25-100 MG per tablet Take 1/2 to 1 tablet by mouth daily before bedtime.  . clopidogrel (PLAVIX) 75 MG tablet Take 1 tablet (75 mg total) by mouth daily.  Marland Kitchen ezetimibe (ZETIA) 10 MG tablet TAKE 1 TABLET BY MOUTH EVERY DAY  . Krill Oil 1000 MG CAPS Take 1,000 mg by mouth daily.  . metoprolol tartrate (LOPRESSOR) 25 MG tablet TAKE 1 TABLET BY MOUTH TWICE A DAY  . Multiple  Vitamins-Minerals (CENTRUM PO) Take 1 tablet by mouth daily.  . nitroGLYCERIN (NITROSTAT) 0.4 MG SL tablet Place 1 tablet (0.4 mg total) under the tongue every 5 (five) minutes x 3 doses as needed for chest pain.  . rosuvastatin (CRESTOR) 5 MG tablet TAKE 1 TABLET BY MOUTH EVERY DAY  . saw palmetto 160 MG capsule Take 320 mg by mouth daily.  . vitamin B-12 (CYANOCOBALAMIN) 1000 MCG tablet Take 3,000 mcg by mouth daily.   . vitamin C (ASCORBIC ACID) 500 MG tablet Take 500 mg by mouth daily.   No current facility-administered medications for this visit. (Other)      REVIEW OF SYSTEMS: ROS    Positive for: Cardiovascular, Eyes   Negative for: Constitutional, Gastrointestinal, Neurological, Skin, Genitourinary, Musculoskeletal, HENT, Endocrine, Respiratory, Psychiatric, Allergic/Imm, Heme/Lymph   Last edited by Theodore Demark, COA on 07/26/2020  9:25 AM. (History)       ALLERGIES Allergies  Allergen Reactions  . Shellfish Allergy Anaphylaxis    Just to shrimp    PAST MEDICAL HISTORY Past Medical History:  Diagnosis Date  . Back pain   . CAD (coronary artery disease)    a. 09/2018 Inf STEMI/PCI: LM nl, LAD min irregs, D1/2 min irregs, LCX 100ost/p thrombotic (2.75x35 Orsiro DES), RCA 40d, RPAV 90.  . Carpal tunnel syndrome   .  DDD (degenerative disc disease)   . Diastolic dysfunction    a. 09/2018 Echo: EF 50-55%, prob inflat and antlat HK, Gr1 DD. Mildlly dil Ao root (2m). Nl RV fxn.  . Hyperlipemia   . Mildly Dilated aortic root ([place])    a. 09/2018 Echo: 54mm.  . Tubular adenocarcinoma ([place]) 2010   colon   Past Surgical History:  Procedure Laterality Date  . CARPAL TUNNEL RELEASE Left 01/06/2013   Procedure: CARPAL TUNNEL RELEASE;  Surgeon: Wynonia Sours, MD;  Location: [facility];  Service: Orthopedics;  Laterality: Left;  ANESTHESIA: IV REGIONAL FAB  . COLONOSCOPY    . CORONARY ANGIOGRAPHY N/A 10/08/2018   Procedure: CORONARY ANGIOGRAPHY;  Surgeon:  Sherren Mocha, MD;  Location: [facility];  Service: Cardiovascular;  Laterality: N/A;  . CORONARY STENT INTERVENTION N/A 10/08/2018   Procedure: CORONARY STENT INTERVENTION;  Surgeon: Sherren Mocha, MD;  Location: [facility];  Service: Cardiovascular;  Laterality: N/A;  . CORONARY/GRAFT ACUTE MI REVASCULARIZATION N/A 10/08/2018   Procedure: Coronary/Graft Acute MI Revascularization;  Surgeon: Sherren Mocha, MD;  Location: [facility];  Service: Cardiovascular;  Laterality: N/A;  . KNEE ARTHROSCOPY     rightx2  . RIB RESECTION  2003   thorasic outlet syndrome-rt  . SHOULDER ARTHROSCOPY     rightx2  . TONSILLECTOMY      FAMILY HISTORY History reviewed. No pertinent family history.  SOCIAL HISTORY Social History   Tobacco Use  . Smoking status: Never Smoker  . Smokeless tobacco: Never Used  Vaping Use  . Vaping Use: Never used  Substance Use Topics  . Alcohol use: Yes    Comment: occ  . Drug use: No         OPHTHALMIC EXAM:  Base Eye Exam    Visual Acuity (Snellen - Linear)      Right Left   Dist  [place] 20/30 -2 20/25 -1   Dist ph [place] NI 20/20 -1       Tonometry (Tonopen, 9:23 AM)      Right Left   Pressure 21 21       Pupils      Dark Light Shape React APD   Right 4 3 Round Brisk None   Left 4 3 Round Brisk None       Visual Fields (Counting fingers)      Left Right    Full Full       Extraocular Movement      Right Left    Full Full       Neuro/Psych    Oriented x3: Yes   Mood/Affect: Normal       Dilation    Both eyes: 1.0% Mydriacyl, 2.5% Phenylephrine @ 9:22 AM        Slit Lamp and Fundus Exam    Slit Lamp Exam      Right Left   Lids/Lashes Dermatochalasis - upper lid Dermatochalasis - upper lid   Conjunctiva/Sclera White and quiet White and quiet   Cornea Mild arcus, trace Punctate epithelial erosions, trace Debris in tear film, well healed temporal cataract wounds Mild arcus, trace Punctate epithelial erosions,  trace Debris in tear film, well healed temporal cataract wounds   Anterior Chamber Deep and quiet Deep and quiet   Iris Round and dilated Round and dilated   Lens MF PC IOL in perfect position with open PC MF PC IOL in perfect position with open PC   Vitreous Vitreous syneresis, Posterior vitreous  detachment, vitreous condensations -- prominent linear white condensation IT quad Vitreous syneresis, Posterior vitreous detachment, vitreous condensations       Fundus Exam      Right Left   Disc Pink and Sharp, Compact Pink and Sharp   C/D Ratio 0.4 0.5   Macula Flat, good foveal reflex, trace, persistent Cystic changes, mild RPE mottling and clumping Flat, good foveal reflex, RPE mottling and clumping, No heme or edema   Vessels mild attenuation, mild tortuousity mild attenuation, mild tortuousity   Periphery Attached, No heme, No RT/RD Attached, No heme, No RT/RD          IMAGING AND PROCEDURES  Imaging and Procedures for 07/26/2020  OCT, Retina - OU - Both Eyes       Right Eye Quality was good. Central Foveal Thickness: 360. Progression has been stable. Findings include normal foveal contour, intraretinal fluid, no SRF, vitreomacular adhesion  (Mild interval improvement in cystic changes).   Left Eye Quality was good. Central Foveal Thickness: 341. Progression has been stable. Findings include normal foveal contour, no IRF, no SRF, vitreomacular adhesion .   Notes *Images captured and stored on drive  Diagnosis / Impression:  OD: trace CME -- Mild interval improvement in cystic changes OS: NFP, no IRF/SRF  Clinical management:  See below  Abbreviations: NFP - Normal foveal profile. CME - cystoid macular edema. PED - pigment epithelial detachment. IRF - intraretinal fluid. SRF - subretinal fluid. EZ - ellipsoid zone. ERM - epiretinal membrane. ORA - outer retinal atrophy. ORT - outer retinal tubulation. SRHM - subretinal hyper-reflective material. IRHM - intraretinal  hyper-reflective material                 ASSESSMENT/PLAN:    ICD-10-CM   1. Cystoid macular edema of right eye  H35.351   2. Retinal edema  H35.81 OCT, Retina - OU - Both Eyes  3. Essential hypertension  I10   4. Hypertensive retinopathy of both eyes  H35.033   5. Pseudophakia, both eyes  Z96.1     1,2. CME OD  - initially diagnosed w/ CME OU by Dr. [name] on 08.16.21 and started on Durezol and BromSite TID OU  - pt reports self d/c of drops 2-3 days prior to initial visit due to improvement in vision  - today BCVA 20/30 OD (decreased) and 20/20 OS  - OCT shows OD with mild interval improvement in CME/IRF -- persistent; OS without CME  - continue Durezol and Bromsite/Prolensa QID OD only   - f/u 3-4 weeks, DFE, OCT  3,4. Hypertensive retinopathy OU - discussed importance of tight BP control - monitor  5. Pseudophakia OU  - s/p CE/IOL OU 2019  - multifocal IOLs in perfect position, doing well  - monitor   Ophthalmic Meds Ordered this visit:  Meds ordered this encounter  Medications  . Difluprednate (DUREZOL) 0.05 % EMUL    Sig: Place 1 drop into the right eye 4 (four) times daily.    Dispense:  5 mL    Refill:  1  . Bromfenac Sodium (PROLENSA) 0.07 % SOLN    Sig: Place 1 drop into the right eye 4 (four) times daily.    Dispense:  3 mL    Refill:  2       Return for f/u CME OD, DFE, OCT.  There are no Patient Instructions on file for this visit.   Explained the diagnoses, plan, and follow up with the patient and they expressed understanding.  Patient  expressed understanding of the importance of proper follow up care.  This document serves as a record of services personally performed by Gardiner Sleeper, MD, PhD. It was created on their behalf by Roselee Nova, COMT. The creation of this record is the provider's dictation and/or activities during the visit.  Electronically signed by: Roselee Nova, COMT 07/27/20 11:39 PM  This document serves as a  record of services personally performed by Gardiner Sleeper, MD, PhD. It was created on their behalf by San Jetty. Owens Shark, OA an ophthalmic technician. The creation of this record is the provider's dictation and/or activities during the visit.    Electronically signed by: San Jetty. [place], [place] 10.13.2021 11:39 PM.   Gardiner Sleeper, M.D., Ph.D. Diseases & Surgery of the Retina and Vitreous [facility]  I have reviewed the above documentation for accuracy and completeness, and I agree with the above. Gardiner Sleeper, M.D., Ph.D. 07/27/20 11:39 PM     Abbreviations: M myopia (nearsighted); A astigmatism; H hyperopia (farsighted); P presbyopia; Mrx spectacle prescription;  CTL contact lenses; OD right eye; OS left eye; OU both eyes  XT exotropia; ET esotropia; PEK punctate epithelial keratitis; PEE punctate epithelial erosions; DES dry eye syndrome; MGD meibomian gland dysfunction; ATs artificial tears; PFAT's preservative free artificial tears; [place] nuclear sclerotic cataract; PSC posterior subcapsular cataract; ERM epi-retinal membrane; PVD posterior vitreous detachment; RD retinal detachment; DM diabetes mellitus; DR diabetic retinopathy; NPDR non-proliferative diabetic retinopathy; PDR proliferative diabetic retinopathy; CSME clinically significant macular edema; DME diabetic macular edema; dbh dot blot hemorrhages; CWS cotton wool spot; POAG primary open angle glaucoma; C/D cup-to-disc ratio; HVF humphrey visual field; GVF goldmann visual field; OCT optical coherence tomography; IOP intraocular pressure; BRVO Branch retinal vein occlusion; CRVO central retinal vein occlusion; CRAO central retinal artery occlusion; BRAO branch retinal artery occlusion; RT retinal tear; SB scleral buckle; PPV pars plana vitrectomy; VH Vitreous hemorrhage; PRP panretinal laser photocoagulation; IVK intravitreal kenalog; VMT vitreomacular traction; MH Macular hole;  NVD neovascularization of the disc;  NVE neovascularization elsewhere; AREDS age related eye disease study; ARMD age related macular degeneration; POAG primary open angle glaucoma; EBMD epithelial/anterior basement membrane dystrophy; ACIOL anterior chamber intraocular lens; IOL intraocular lens; PCIOL posterior chamber intraocular lens; Phaco/IOL phacoemulsification with intraocular lens placement; [place] photorefractive keratectomy; LASIK laser assisted in situ keratomileusis; HTN hypertension; DM diabetes mellitus; COPD chronic obstructive pulmonary disease

## 2020-07-26 ENCOUNTER — Encounter (INDEPENDENT_AMBULATORY_CARE_PROVIDER_SITE_OTHER): Payer: Self-pay | Admitting: Ophthalmology

## 2020-07-26 ENCOUNTER — Ambulatory Visit (INDEPENDENT_AMBULATORY_CARE_PROVIDER_SITE_OTHER): Payer: 59 | Admitting: Ophthalmology

## 2020-07-26 DIAGNOSIS — H35033 Hypertensive retinopathy, bilateral: Secondary | ICD-10-CM

## 2020-07-26 DIAGNOSIS — H3581 Retinal edema: Secondary | ICD-10-CM | POA: Diagnosis not present

## 2020-07-26 DIAGNOSIS — H35351 Cystoid macular degeneration, right eye: Secondary | ICD-10-CM | POA: Diagnosis not present

## 2020-07-26 DIAGNOSIS — I10 Essential (primary) hypertension: Secondary | ICD-10-CM | POA: Diagnosis not present

## 2020-07-26 DIAGNOSIS — Z961 Presence of intraocular lens: Secondary | ICD-10-CM

## 2020-07-26 LAB — LIPID PANEL
Chol/HDL Ratio: 2.9 ratio (ref 0.0–5.0)
Cholesterol, Total: 117 mg/dL (ref 100–199)
HDL: 40 mg/dL (ref >39)
LDL Chol Calc (NIH): 60 mg/dL (ref 0–99)
Triglycerides: 90 mg/dL (ref 0–149)
VLDL Cholesterol Cal: 17 mg/dL (ref 5–40)

## 2020-07-26 LAB — HEPATIC FUNCTION PANEL
ALT: 49 IU/L — ABNORMAL HIGH (ref 0–44)
AST: 26 IU/L (ref 0–40)
Albumin: 4.1 g/dL (ref 3.8–4.9)
Alkaline Phosphatase: 70 IU/L (ref 44–121)
Bilirubin Total: 0.6 mg/dL (ref 0.0–1.2)
Bilirubin, Direct: 0.14 mg/dL (ref 0.00–0.40)
Total Protein: 6.4 g/dL (ref 6.0–8.5)

## 2020-07-26 LAB — HEMOGLOBIN A1C
Est. average glucose Bld gHb Est-mCnc: 94 mg/dL
Hgb A1c MFr Bld: 4.9 % (ref 4.8–5.6)

## 2020-07-26 LAB — SPECIMEN STATUS REPORT

## 2020-07-26 MED ORDER — DUREZOL 0.05 % OP EMUL: 1.0000 [drp] | Freq: Four times a day (QID) | OPHTHALMIC | 1 refills | Status: DC

## 2020-07-26 MED ORDER — PROLENSA 0.07 % OP SOLN: 1.0000 [drp] | Freq: Four times a day (QID) | OPHTHALMIC | 2 refills | Status: DC

## 2020-07-27 ENCOUNTER — Encounter (INDEPENDENT_AMBULATORY_CARE_PROVIDER_SITE_OTHER): Payer: Self-pay | Admitting: Ophthalmology

## 2020-08-28 ENCOUNTER — Encounter (INDEPENDENT_AMBULATORY_CARE_PROVIDER_SITE_OTHER): Payer: 59 | Admitting: Ophthalmology

## 2020-08-30 NOTE — Progress Notes (Signed)
[facility] Note  09/01/2020     CHIEF COMPLAINT Patient presents for Retina Follow Up   HISTORY OF PRESENT ILLNESS: Andrew Mitchell is a 59 y.o. male who presents to the clinic today for:   HPI    Retina Follow Up    Patient presents with  Other.  In right eye.  Severity is mild.  Since onset it is gradually improving.  I, the attending physician,  performed the HPI with the patient and updated documentation appropriately.          Comments    5 week follow up CME OD- Vision stable OU.  Using Prolensa and Durezol QID OD.         Last edited by [name], MD on 09/02/2020  2:01 AM. (History)    pt states his vision has improved   Referring physician: [name], MD [place],  [place] [address]  HISTORICAL INFORMATION:   Selected notes from the MEDICAL RECORD NUMBER Referred by Dr. [name] for eval of CME    CURRENT MEDICATIONS: Current Outpatient Medications (Ophthalmic Drugs)  Medication Sig  . Bromfenac Sodium (PROLENSA) 0.07 % SOLN Place 1 drop into the right eye 4 (four) times daily.  . Difluprednate (DUREZOL) 0.05 % EMUL Place 1 drop into the right eye 4 (four) times daily.  . Bromfenac Sodium (PROLENSA) 0.07 % SOLN Place 1 drop into the right eye 3 (three) times daily. (Patient not taking: Reported on 09/01/2020)   No current facility-administered medications for this visit. (Ophthalmic Drugs)   Current Outpatient Medications (Other)  Medication Sig  . carbidopa-levodopa (SINEMET IR) 25-100 MG per tablet Take 1/2 to 1 tablet by mouth daily before bedtime.  Marland Kitchen ezetimibe (ZETIA) 10 MG tablet TAKE 1 TABLET BY MOUTH EVERY DAY  . Krill Oil 1000 MG CAPS Take 1,000 mg by mouth daily.  . metoprolol tartrate (LOPRESSOR) 25 MG tablet TAKE 1 TABLET BY MOUTH TWICE A DAY  . Multiple Vitamins-Minerals (CENTRUM PO) Take 1 tablet by mouth daily.  . nitroGLYCERIN (NITROSTAT) 0.4 MG SL tablet Place 1 tablet (0.4 mg total)  under the tongue every 5 (five) minutes x 3 doses as needed for chest pain.  . rosuvastatin (CRESTOR) 5 MG tablet TAKE 1 TABLET BY MOUTH EVERY DAY  . saw palmetto 160 MG capsule Take 320 mg by mouth daily.  . vitamin B-12 (CYANOCOBALAMIN) 1000 MCG tablet Take 3,000 mcg by mouth daily.   . vitamin C (ASCORBIC ACID) 500 MG tablet Take 500 mg by mouth daily.  . clopidogrel (PLAVIX) 75 MG tablet Take 1 tablet (75 mg total) by mouth daily.   No current facility-administered medications for this visit. (Other)      REVIEW OF SYSTEMS: ROS    Positive for: Cardiovascular, Eyes, Psychiatric   Negative for: Constitutional, Gastrointestinal, Neurological, Skin, Genitourinary, Musculoskeletal, HENT, Endocrine, Respiratory, Allergic/Imm, Heme/Lymph   Last edited by Leonie Douglas, COA on 09/01/2020  9:14 AM. (History)       ALLERGIES Allergies  Allergen Reactions  . Shellfish Allergy Anaphylaxis    Just to shrimp    PAST MEDICAL HISTORY Past Medical History:  Diagnosis Date  . Back pain   . CAD (coronary artery disease)    a. 09/2018 Inf STEMI/PCI: LM nl, LAD min irregs, D1/2 min irregs, LCX 100ost/p thrombotic (2.75x35 Orsiro DES), RCA 40d, RPAV 90.  . Carpal tunnel syndrome   . DDD (degenerative disc disease)   . Diastolic dysfunction  a. 09/2018 Echo: EF 50-55%, prob inflat and antlat HK, Gr1 DD. Mildlly dil Ao root (87m). Nl RV fxn.  . Hyperlipemia   . Mildly Dilated aortic root ([place])    a. 09/2018 Echo: 73mm.  . Tubular adenocarcinoma ([place]) 2010   colon   Past Surgical History:  Procedure Laterality Date  . CARPAL TUNNEL RELEASE Left 01/06/2013   Procedure: CARPAL TUNNEL RELEASE;  Surgeon: Wynonia Sours, MD;  Location: [facility];  Service: Orthopedics;  Laterality: Left;  ANESTHESIA: IV REGIONAL FAB  . COLONOSCOPY    . CORONARY ANGIOGRAPHY N/A 10/08/2018   Procedure: CORONARY ANGIOGRAPHY;  Surgeon: Sherren Mocha, MD;  Location: [facility];  Service:  Cardiovascular;  Laterality: N/A;  . CORONARY STENT INTERVENTION N/A 10/08/2018   Procedure: CORONARY STENT INTERVENTION;  Surgeon: Sherren Mocha, MD;  Location: [facility];  Service: Cardiovascular;  Laterality: N/A;  . CORONARY/GRAFT ACUTE MI REVASCULARIZATION N/A 10/08/2018   Procedure: Coronary/Graft Acute MI Revascularization;  Surgeon: Sherren Mocha, MD;  Location: [facility];  Service: Cardiovascular;  Laterality: N/A;  . KNEE ARTHROSCOPY     rightx2  . RIB RESECTION  2003   thorasic outlet syndrome-rt  . SHOULDER ARTHROSCOPY     rightx2  . TONSILLECTOMY      FAMILY HISTORY History reviewed. No pertinent family history.  SOCIAL HISTORY Social History   Tobacco Use  . Smoking status: Never Smoker  . Smokeless tobacco: Never Used  Vaping Use  . Vaping Use: Never used  Substance Use Topics  . Alcohol use: Yes    Comment: occ  . Drug use: No         OPHTHALMIC EXAM:  Base Eye Exam    Visual Acuity (Snellen - Linear)      Right Left   Dist [place] 20/20 20/20 -2       Tonometry (Tonopen, 9:18 AM)      Right Left   Pressure 23 18       Pupils      Dark Light Shape React APD   Right 4 3 Round Brisk None   Left 4 3 Round Brisk None       Visual Fields (Counting fingers)      Left Right    Full Full       Extraocular Movement      Right Left    Full Full       Neuro/Psych    Oriented x3: Yes   Mood/Affect: Normal       Dilation    Both eyes: 1.0% Mydriacyl, 2.5% Phenylephrine @ 9:18 AM        Slit Lamp and Fundus Exam    Slit Lamp Exam      Right Left   Lids/Lashes Dermatochalasis - upper lid Dermatochalasis - upper lid   Conjunctiva/Sclera White and quiet White and quiet   Cornea Mild arcus, 1+Punctate epithelial erosions, trace Debris in tear film, well healed temporal cataract wounds Mild arcus, trace Punctate epithelial erosions, trace Debris in tear film, well healed temporal cataract wounds   Anterior Chamber Deep and  quiet Deep and quiet   Iris Round and dilated Round and dilated   Lens MF PC IOL in perfect position with open PC MF PC IOL in perfect position with open PC   Vitreous Vitreous syneresis, Posterior vitreous detachment, vitreous condensations -- prominent linear white condensation IT quad Vitreous syneresis, Posterior vitreous detachment, vitreous condensations  Fundus Exam      Right Left   Disc Pink and Sharp, Compact Pink and Sharp   C/D Ratio 0.4 0.5   Macula Flat, good foveal reflex, trace, persistent Cystic changes - slightly improved, mild RPE mottling and clumping Flat, good foveal reflex, RPE mottling and clumping, No heme or edema   Vessels mild attenuation, mild tortuousity mild attenuation, mild tortuousity   Periphery Attached, No heme, No RT/RD Attached, No heme, No RT/RD          IMAGING AND PROCEDURES  Imaging and Procedures for 09/01/2020  OCT, Retina - OU - Both Eyes       Right Eye Quality was good. Central Foveal Thickness: 345. Progression has improved. Findings include normal foveal contour, intraretinal fluid, no SRF, vitreomacular adhesion  (Mild interval improvement in cystic changes).   Left Eye Quality was good. Central Foveal Thickness: 343. Progression has been stable. Findings include normal foveal contour, no IRF, no SRF, vitreomacular adhesion  (Interval release of central VMA - now partial PVD).   Notes *Images captured and stored on drive  Diagnosis / Impression:  OD: trace CME -- Mild interval improvement in cystic changes OS: NFP, no IRF/SRF  Clinical management:  See below  Abbreviations: NFP - Normal foveal profile. CME - cystoid macular edema. PED - pigment epithelial detachment. IRF - intraretinal fluid. SRF - subretinal fluid. EZ - ellipsoid zone. ERM - epiretinal membrane. ORA - outer retinal atrophy. ORT - outer retinal tubulation. SRHM - subretinal hyper-reflective material. IRHM - intraretinal hyper-reflective material                  ASSESSMENT/PLAN:    ICD-10-CM   1. Cystoid macular edema of right eye  H35.351   2. Retinal edema  H35.81 OCT, Retina - OU - Both Eyes  3. Essential hypertension  I10   4. Hypertensive retinopathy of both eyes  H35.033   5. Pseudophakia, both eyes  Z96.1     1,2. CME OD  - initially diagnosed w/ CME OU by Dr. [name] on 08.16.21 and started on Durezol and BromSite TID OU  - pt reports self d/c of drops 2-3 days prior to initial visit due to improvement in vision  - today BCVA 20/20 OU  - OCT shows OD with mild interval improvement in CME/IRF -- tr CME / not quite fully resolved; OS without CME  - continue Durezol and Bromsite/Prolensa QID OD only   - f/u 4-5 weeks, DFE, OCT  3,4. Hypertensive retinopathy OU - discussed importance of tight BP control - monitor  5. Pseudophakia OU  - s/p CE/IOL OU 2019  - multifocal IOLs in perfect position, doing well  - monitor   Ophthalmic Meds Ordered this visit:  No orders of the defined types were placed in this encounter.      Return for f/u 4-5 weeks, CME OD, DFE, OCT.  There are no Patient Instructions on file for this visit.   Explained the diagnoses, plan, and follow up with the patient and they expressed understanding.  Patient expressed understanding of the importance of proper follow up care.  This document serves as a record of services personally performed by Gardiner Sleeper, MD, PhD. It was created on their behalf by San Jetty. Owens Shark, OA an ophthalmic technician. The creation of this record is the provider's dictation and/or activities during the visit.    Electronically signed by: San Jetty. [place], [place] 11.17.2021 2:05 AM  Gardiner Sleeper, M.D., Ph.D. Diseases &  Surgery of the Retina and Vitreous [facility]  I have reviewed the above documentation for accuracy and completeness, and I agree with the above. Gardiner Sleeper, M.D., Ph.D. 09/02/20 2:05 AM  Abbreviations: M  myopia (nearsighted); A astigmatism; H hyperopia (farsighted); P presbyopia; Mrx spectacle prescription;  CTL contact lenses; OD right eye; OS left eye; OU both eyes  XT exotropia; ET esotropia; PEK punctate epithelial keratitis; PEE punctate epithelial erosions; DES dry eye syndrome; MGD meibomian gland dysfunction; ATs artificial tears; PFAT's preservative free artificial tears; [place] nuclear sclerotic cataract; PSC posterior subcapsular cataract; ERM epi-retinal membrane; PVD posterior vitreous detachment; RD retinal detachment; DM diabetes mellitus; DR diabetic retinopathy; NPDR non-proliferative diabetic retinopathy; PDR proliferative diabetic retinopathy; CSME clinically significant macular edema; DME diabetic macular edema; dbh dot blot hemorrhages; CWS cotton wool spot; POAG primary open angle glaucoma; C/D cup-to-disc ratio; HVF humphrey visual field; GVF goldmann visual field; OCT optical coherence tomography; IOP intraocular pressure; BRVO Branch retinal vein occlusion; CRVO central retinal vein occlusion; CRAO central retinal artery occlusion; BRAO branch retinal artery occlusion; RT retinal tear; SB scleral buckle; PPV pars plana vitrectomy; VH Vitreous hemorrhage; PRP panretinal laser photocoagulation; IVK intravitreal kenalog; VMT vitreomacular traction; MH Macular hole;  NVD neovascularization of the disc; NVE neovascularization elsewhere; AREDS age related eye disease study; ARMD age related macular degeneration; POAG primary open angle glaucoma; EBMD epithelial/anterior basement membrane dystrophy; ACIOL anterior chamber intraocular lens; IOL intraocular lens; PCIOL posterior chamber intraocular lens; Phaco/IOL phacoemulsification with intraocular lens placement; [place] photorefractive keratectomy; LASIK laser assisted in situ keratomileusis; HTN hypertension; DM diabetes mellitus; COPD chronic obstructive pulmonary disease

## 2020-09-01 ENCOUNTER — Ambulatory Visit (INDEPENDENT_AMBULATORY_CARE_PROVIDER_SITE_OTHER): Payer: 59 | Admitting: Ophthalmology

## 2020-09-01 ENCOUNTER — Other Ambulatory Visit: Payer: Self-pay

## 2020-09-01 DIAGNOSIS — H35033 Hypertensive retinopathy, bilateral: Secondary | ICD-10-CM | POA: Diagnosis not present

## 2020-09-01 DIAGNOSIS — I10 Essential (primary) hypertension: Secondary | ICD-10-CM | POA: Diagnosis not present

## 2020-09-01 DIAGNOSIS — H3581 Retinal edema: Secondary | ICD-10-CM

## 2020-09-01 DIAGNOSIS — Z961 Presence of intraocular lens: Secondary | ICD-10-CM

## 2020-09-01 DIAGNOSIS — H35351 Cystoid macular degeneration, right eye: Secondary | ICD-10-CM | POA: Diagnosis not present

## 2020-09-02 ENCOUNTER — Encounter (INDEPENDENT_AMBULATORY_CARE_PROVIDER_SITE_OTHER): Payer: Self-pay | Admitting: Ophthalmology

## 2020-09-06 ENCOUNTER — Other Ambulatory Visit: Payer: Self-pay | Admitting: Interventional Cardiology

## 2020-10-11 ENCOUNTER — Encounter (INDEPENDENT_AMBULATORY_CARE_PROVIDER_SITE_OTHER): Payer: 59 | Admitting: Ophthalmology

## 2020-10-11 NOTE — Progress Notes (Signed)
[facility] Note  10/12/2020     CHIEF COMPLAINT Patient presents for Retina Follow Up   HISTORY OF PRESENT ILLNESS: LEONCE JETT is a 59 y.o. male who presents to the clinic today for:   HPI    Retina Follow Up    Patient presents with  Other.  In right eye.  This started months ago.  Severity is mild.  Duration of 6 weeks.  Since onset it is stable.  I, the attending physician,  performed the HPI with the patient and updated documentation appropriately.          Comments    59 y/o male pt here for 6 wk f/u for CME OD.  No change in [place] OU.  Denies pain, FOL, floaters.  Prolensa and Durezol QID OD.       Last edited by [name], MD on 10/12/2020  5:00 PM. (History)    pt is using Durezol and Prolensa QID OD only, no problems with vision   Referring physician: [name], MD [place],  [place] [address]  HISTORICAL INFORMATION:   Selected notes from the MEDICAL RECORD NUMBER Referred by Dr. [name] for eval of CME    CURRENT MEDICATIONS: Current Outpatient Medications (Ophthalmic Drugs)  Medication Sig   Bromfenac Sodium (PROLENSA) 0.07 % SOLN Place 1 drop into the right eye 3 (three) times daily.   Bromfenac Sodium (PROLENSA) 0.07 % SOLN Place 1 drop into the right eye 4 (four) times daily.   Difluprednate (DUREZOL) 0.05 % EMUL Place 1 drop into the right eye 4 (four) times daily.   No current facility-administered medications for this visit. (Ophthalmic Drugs)   Current Outpatient Medications (Other)  Medication Sig   buPROPion (WELLBUTRIN XL) 150 MG 24 hr tablet TAKE 1-2 TABLETS BY MOUTH IN THE MORNING   carbidopa-levodopa (SINEMET IR) 25-100 MG per tablet Take 1/2 to 1 tablet by mouth daily before bedtime.   clopidogrel (PLAVIX) 75 MG tablet Take 1 tablet (75 mg total) by mouth daily.   ezetimibe (ZETIA) 10 MG tablet TAKE 1 TABLET BY MOUTH EVERY DAY   Krill Oil 1000 MG CAPS Take 1,000 mg by mouth  daily.   metoprolol tartrate (LOPRESSOR) 25 MG tablet TAKE 1 TABLET BY MOUTH TWICE A DAY   Multiple Vitamins-Minerals (CENTRUM PO) Take 1 tablet by mouth daily.   nitroGLYCERIN (NITROSTAT) 0.4 MG SL tablet Place 1 tablet (0.4 mg total) under the tongue every 5 (five) minutes x 3 doses as needed for chest pain.   rosuvastatin (CRESTOR) 5 MG tablet TAKE 1 TABLET BY MOUTH EVERY DAY   saw palmetto 160 MG capsule Take 320 mg by mouth daily.   sildenafil (REVATIO) 20 MG tablet 2-5 tablets   vitamin B-12 (CYANOCOBALAMIN) 1000 MCG tablet Take 3,000 mcg by mouth daily.    vitamin C (ASCORBIC ACID) 500 MG tablet Take 500 mg by mouth daily.   No current facility-administered medications for this visit. (Other)      REVIEW OF SYSTEMS: ROS    Positive for: Cardiovascular, Eyes   Negative for: Constitutional, Gastrointestinal, Neurological, Skin, Genitourinary, Musculoskeletal, HENT, Endocrine, Respiratory, Psychiatric, Allergic/Imm, Heme/Lymph   Last edited by [name], COA on 10/12/2020  9:12 AM. (History)       ALLERGIES Allergies  Allergen Reactions   Shellfish Allergy Anaphylaxis    Just to shrimp    PAST MEDICAL HISTORY Past Medical History:  Diagnosis Date   Back pain  CAD (coronary artery disease)    a. 09/2018 Inf STEMI/PCI: LM nl, LAD min irregs, D1/2 min irregs, LCX 100ost/p thrombotic (2.75x35 Orsiro DES), RCA 40d, RPAV 90.   Carpal tunnel syndrome    DDD (degenerative disc disease)    Diastolic dysfunction    a. 09/2018 Echo: EF 50-55%, prob inflat and antlat HK, Gr1 DD. Mildlly dil Ao root (69m). Nl RV fxn.   Hyperlipemia    Hypertensive retinopathy    OU   Mildly Dilated aortic root ([place])    a. 09/2018 Echo: 52mm.   Tubular adenocarcinoma ([place]) 2010   colon   Past Surgical History:  Procedure Laterality Date   CARPAL TUNNEL RELEASE Left 01/06/2013   Procedure: CARPAL TUNNEL RELEASE;  Surgeon: Wynonia Sours, MD;  Location: [facility];  Service: Orthopedics;  Laterality: Left;  ANESTHESIA: IV REGIONAL FAB   CATARACT EXTRACTION Bilateral 2019   Dr. Peterson Ao [place]   COLONOSCOPY     CORONARY ANGIOGRAPHY N/A 10/08/2018   Procedure: CORONARY ANGIOGRAPHY;  Surgeon: Sherren Mocha, MD;  Location: [facility];  Service: Cardiovascular;  Laterality: N/A;   CORONARY STENT INTERVENTION N/A 10/08/2018   Procedure: CORONARY STENT INTERVENTION;  Surgeon: Sherren Mocha, MD;  Location: [facility];  Service: Cardiovascular;  Laterality: N/A;   CORONARY/GRAFT ACUTE MI REVASCULARIZATION N/A 10/08/2018   Procedure: Coronary/Graft Acute MI Revascularization;  Surgeon: Sherren Mocha, MD;  Location: [facility];  Service: Cardiovascular;  Laterality: N/A;   EYE SURGERY Bilateral 2019   Cat Sx - Dr. [name]   KNEE ARTHROSCOPY     rightx2   RIB RESECTION  2003   thorasic outlet syndrome-rt   SHOULDER ARTHROSCOPY     rightx2   TONSILLECTOMY      FAMILY HISTORY History reviewed. No pertinent family history.  SOCIAL HISTORY Social History   Tobacco Use   Smoking status: Never Smoker   Smokeless tobacco: Never Used  Vaping Use   Vaping Use: Never used  Substance Use Topics   Alcohol use: Yes    Comment: occ   Drug use: No         OPHTHALMIC EXAM:  Base Eye Exam    Visual Acuity (Snellen - Linear)      Right Left   Dist [place] 20/20 -2 20/20 -2       Tonometry (Tonopen, 9:16 AM)      Right Left   Pressure 22 18       Pupils      Dark Light Shape React APD   Right 4 3 Round Brisk None   Left 4 3 Round Brisk None       Visual Fields (Counting fingers)      Left Right    Full Full       Extraocular Movement      Right Left    Full, Ortho Full, Ortho       Neuro/Psych    Oriented x3: Yes   Mood/Affect: Normal       Dilation    Both eyes: 1.0% Mydriacyl, 2.5% Phenylephrine @ 9:16 AM        Slit Lamp and Fundus Exam    Slit Lamp Exam       Right Left   Lids/Lashes Dermatochalasis - upper lid Dermatochalasis - upper lid   Conjunctiva/Sclera White and quiet White and quiet   Cornea Mild arcus, trace Punctate epithelial erosions, trace Debris in tear film, well healed temporal cataract  wounds, fine endo pigment Mild arcus, trace Punctate epithelial erosions, trace Debris in tear film, well healed temporal cataract wounds, trace fine endo pigment   Anterior Chamber Deep and quiet Deep and quiet   Iris Round and dilated Round and dilated   Lens MF PC IOL in perfect position with open PC MF PC IOL in perfect position with open PC   Vitreous Vitreous syneresis, Posterior vitreous detachment, vitreous condensations -- prominent linear white condensation IT quad Vitreous syneresis, Posterior vitreous detachment, vitreous condensations       Fundus Exam      Right Left   Disc Pink and Sharp, Compact Pink and Sharp   C/D Ratio 0.4 0.5   Macula Flat, good foveal reflex, trace, persistent Cystic changes, mild RPE mottling and clumping Flat, good foveal reflex, RPE mottling and clumping, No heme or edema   Vessels mild attenuation, mild tortuousity mild attenuation, mild tortuousity   Periphery Attached, No heme, No RT/RD Attached, No heme, No RT/RD          IMAGING AND PROCEDURES  Imaging and Procedures for 10/12/2020  OCT, Retina - OU - Both Eyes       Right Eye Quality was good. Central Foveal Thickness: 345. Progression has been stable. Findings include normal foveal contour, intraretinal fluid, no SRF, vitreomacular adhesion  (Mild persistent cystic changes).   Left Eye Quality was good. Central Foveal Thickness: 344. Progression has been stable. Findings include normal foveal contour, no IRF, no SRF, vitreomacular adhesion  (partial PVD).   Notes *Images captured and stored on drive  Diagnosis / Impression:  OD: trace persistent cystic changes OS: NFP, no IRF/SRF  Clinical management:  See below  Abbreviations: NFP  - Normal foveal profile. CME - cystoid macular edema. PED - pigment epithelial detachment. IRF - intraretinal fluid. SRF - subretinal fluid. EZ - ellipsoid zone. ERM - epiretinal membrane. ORA - outer retinal atrophy. ORT - outer retinal tubulation. SRHM - subretinal hyper-reflective material. IRHM - intraretinal hyper-reflective material                 ASSESSMENT/PLAN:    ICD-10-CM   1. Cystoid macular edema of right eye  H35.351   2. Retinal edema  H35.81 OCT, Retina - OU - Both Eyes  3. Essential hypertension  I10   4. Hypertensive retinopathy of both eyes  H35.033   5. Pseudophakia, both eyes  Z96.1     1,2. CME OD  - initially diagnosed w/ CME OU by Dr. Delaney Meigs on 08.16.21 and started on Durezol and BromSite TID OU  - pt reports self d/c of drops 2-3 days prior to initial visit due to improvement in vision  - today BCVA 20/20 OU  - OCT shows OD stable improvement in CME/IRF; OS without CME  - start slow taper of PF and Prolensa -- 3,2,1 drops/day dec every 2 weeks  - f/u 8 weeks, DFE, OCT  3,4. Hypertensive retinopathy OU - discussed importance of tight BP control - monitor  5. Pseudophakia OU  - s/p CE/IOL OU 2019  - multifocal IOLs in perfect position, doing well  - monitor   Ophthalmic Meds Ordered this visit:  No orders of the defined types were placed in this encounter.      Return in about 8 weeks (around 12/07/2020) for f/u CME OD, DFE, OCT.  There are no Patient Instructions on file for this visit.   Explained the diagnoses, plan, and follow up with the patient and they expressed  understanding.  Patient expressed understanding of the importance of proper follow up care.  This document serves as a record of services personally performed by Gardiner Sleeper, MD, PhD. It was created on their behalf by San Jetty. Owens Shark, OA an ophthalmic technician. The creation of this record is the provider's dictation and/or activities during the visit.     Electronically signed by: San Jetty. Owens Shark, [place] 12.29.2021 5:15 PM  Gardiner Sleeper, M.D., Ph.D. Diseases & Surgery of the Retina and Vitreous [facility]  I have reviewed the above documentation for accuracy and completeness, and I agree with the above. Gardiner Sleeper, M.D., Ph.D. 10/12/20 5:15 PM  Abbreviations: M myopia (nearsighted); A astigmatism; H hyperopia (farsighted); P presbyopia; Mrx spectacle prescription;  CTL contact lenses; OD right eye; OS left eye; OU both eyes  XT exotropia; ET esotropia; PEK punctate epithelial keratitis; PEE punctate epithelial erosions; DES dry eye syndrome; MGD meibomian gland dysfunction; ATs artificial tears; PFAT's preservative free artificial tears; [place] nuclear sclerotic cataract; PSC posterior subcapsular cataract; ERM epi-retinal membrane; PVD posterior vitreous detachment; RD retinal detachment; DM diabetes mellitus; DR diabetic retinopathy; NPDR non-proliferative diabetic retinopathy; PDR proliferative diabetic retinopathy; CSME clinically significant macular edema; DME diabetic macular edema; dbh dot blot hemorrhages; CWS cotton wool spot; POAG primary open angle glaucoma; C/D cup-to-disc ratio; HVF humphrey visual field; GVF goldmann visual field; OCT optical coherence tomography; IOP intraocular pressure; BRVO Branch retinal vein occlusion; CRVO central retinal vein occlusion; CRAO central retinal artery occlusion; BRAO branch retinal artery occlusion; RT retinal tear; SB scleral buckle; PPV pars plana vitrectomy; VH Vitreous hemorrhage; PRP panretinal laser photocoagulation; IVK intravitreal kenalog; VMT vitreomacular traction; MH Macular hole;  NVD neovascularization of the disc; NVE neovascularization elsewhere; AREDS age related eye disease study; ARMD age related macular degeneration; POAG primary open angle glaucoma; EBMD epithelial/anterior basement membrane dystrophy; ACIOL anterior chamber intraocular lens; IOL intraocular  lens; PCIOL posterior chamber intraocular lens; Phaco/IOL phacoemulsification with intraocular lens placement; [place] photorefractive keratectomy; LASIK laser assisted in situ keratomileusis; HTN hypertension; DM diabetes mellitus; COPD chronic obstructive pulmonary disease

## 2020-10-12 ENCOUNTER — Ambulatory Visit (INDEPENDENT_AMBULATORY_CARE_PROVIDER_SITE_OTHER): Payer: 59 | Admitting: Ophthalmology

## 2020-10-12 ENCOUNTER — Encounter (INDEPENDENT_AMBULATORY_CARE_PROVIDER_SITE_OTHER): Payer: Self-pay | Admitting: Ophthalmology

## 2020-10-12 ENCOUNTER — Other Ambulatory Visit: Payer: Self-pay

## 2020-10-12 DIAGNOSIS — H35351 Cystoid macular degeneration, right eye: Secondary | ICD-10-CM | POA: Diagnosis not present

## 2020-10-12 DIAGNOSIS — I10 Essential (primary) hypertension: Secondary | ICD-10-CM | POA: Diagnosis not present

## 2020-10-12 DIAGNOSIS — Z961 Presence of intraocular lens: Secondary | ICD-10-CM

## 2020-10-12 DIAGNOSIS — H35033 Hypertensive retinopathy, bilateral: Secondary | ICD-10-CM

## 2020-10-12 DIAGNOSIS — H3581 Retinal edema: Secondary | ICD-10-CM | POA: Diagnosis not present

## 2020-11-02 NOTE — Progress Notes (Signed)
Cardiology Office Note:    Date:  11/06/2020   ID:  Sheryl, Towell 1961-03-22, MRN [id number]  PCP:  [name], MD  Cardiologist:  Sinclair Grooms, MD   Referring MD: [name], MD   Chief Complaint  Patient presents with  . Coronary Artery Disease    History of Present Illness:    Andrew Mitchell is a 60 y.o. male with a hx of DOE, family h/o CAD (father with MI) , hyperlipidemia, inferior MI 2019 treated with RCA DES.  He is doing well.  He denies angina.  No excessive shortness of breath.  He has not had syncope, palpitations, edema, neurological complaints, or orthopnea.  Not walking regularly because of the pandemic and also current weather.  Past Medical History:  Diagnosis Date  . Back pain   . CAD (coronary artery disease)    a. 09/2018 Inf STEMI/PCI: LM nl, LAD min irregs, D1/2 min irregs, LCX 100ost/p thrombotic (2.75x35 Orsiro DES), RCA 40d, RPAV 90.  . Carpal tunnel syndrome   . DDD (degenerative disc disease)   . Diastolic dysfunction    a. 09/2018 Echo: EF 50-55%, prob inflat and antlat HK, Gr1 DD. Mildlly dil Ao root (21m). Nl RV fxn.  . Hyperlipemia   . Hypertensive retinopathy    OU  . Mildly Dilated aortic root ([place])    a. 09/2018 Echo: 62mm.  . Tubular adenocarcinoma ([place]) 2010   colon    Past Surgical History:  Procedure Laterality Date  . CARPAL TUNNEL RELEASE Left 01/06/2013   Procedure: CARPAL TUNNEL RELEASE;  Surgeon: Wynonia Sours, MD;  Location: [facility];  Service: Orthopedics;  Laterality: Left;  ANESTHESIA: IV REGIONAL FAB  . CATARACT EXTRACTION Bilateral 2019   Dr. [name]  . COLONOSCOPY    . CORONARY ANGIOGRAPHY N/A 10/08/2018   Procedure: CORONARY ANGIOGRAPHY;  Surgeon: Sherren Mocha, MD;  Location: [facility];  Service: Cardiovascular;  Laterality: N/A;  . CORONARY STENT INTERVENTION N/A 10/08/2018   Procedure: CORONARY STENT INTERVENTION;  Surgeon: Sherren Mocha, MD;  Location: [facility];  Service: Cardiovascular;  Laterality: N/A;  . CORONARY/GRAFT ACUTE MI REVASCULARIZATION N/A 10/08/2018   Procedure: Coronary/Graft Acute MI Revascularization;  Surgeon: Sherren Mocha, MD;  Location: [facility];  Service: Cardiovascular;  Laterality: N/A;  . EYE SURGERY Bilateral 2019   Cat Sx - Dr. [name]  . KNEE ARTHROSCOPY     rightx2  . RIB RESECTION  2003   thorasic outlet syndrome-rt  . SHOULDER ARTHROSCOPY     rightx2  . TONSILLECTOMY      Current Medications: Current Meds  Medication Sig  . buPROPion (WELLBUTRIN XL) 150 MG 24 hr tablet TAKE 1-2 TABLETS BY MOUTH IN THE MORNING  . carbidopa-levodopa (SINEMET IR) 25-100 MG per tablet Take 1/2 to 1 tablet by mouth daily before bedtime.  . clopidogrel (PLAVIX) 75 MG tablet Take 1 tablet (75 mg total) by mouth daily.  Marland Kitchen ezetimibe (ZETIA) 10 MG tablet TAKE 1 TABLET BY MOUTH EVERY DAY  . Krill Oil 1000 MG CAPS Take 1,000 mg by mouth daily.  . metoprolol tartrate (LOPRESSOR) 25 MG tablet TAKE 1 TABLET BY MOUTH TWICE A DAY  . Multiple Vitamins-Minerals (CENTRUM PO) Take 1 tablet by mouth daily.  . nitroGLYCERIN (NITROSTAT) 0.4 MG SL tablet Place 1 tablet (0.4 mg total) under the tongue every 5 (five) minutes x 3 doses as needed for chest pain.  . rosuvastatin (  CRESTOR) 5 MG tablet TAKE 1 TABLET BY MOUTH EVERY DAY  . sildenafil (REVATIO) 20 MG tablet 2-5 tablets  . vitamin B-12 (CYANOCOBALAMIN) 1000 MCG tablet Take 3,000 mcg by mouth daily.   . vitamin C (ASCORBIC ACID) 500 MG tablet Take 500 mg by mouth daily.     Allergies:   Shellfish allergy   Social History   Socioeconomic History  . Marital status: Married    Spouse name: Not on file  . Number of children: Not on file  . Years of education: Not on file  . Highest education level: Not on file  Occupational History  . Not on file  Tobacco Use  . Smoking status: Never Smoker  . Smokeless tobacco: Never Used  Vaping Use  . Vaping Use:  Never used  Substance and Sexual Activity  . Alcohol use: Yes    Comment: occ  . Drug use: No  . Sexual activity: Not on file  Other Topics Concern  . Not on file  Social History Narrative  . Not on file   Social Determinants of Health   Financial Resource Strain: Not on file  Food Insecurity: Not on file  Transportation Needs: Not on file  Physical Activity: Not on file  Stress: Not on file  Social Connections: Not on file     Family History: The patient's family history is not on file.  ROS:   Please see the history of present illness.    Compliant with his medication regimen.  No side effects.  Has 1 small area of venous congestion anterolateral aspect of lower left extremity.  It gets larger when he stands and is consistent with a venous lake.  All other systems reviewed and are negative.  EKGs/Labs/Other Studies Reviewed:    The following studies were reviewed today: No new imaging  EKG:  EKG sinus rhythm/sinus bradycardia with otherwise normal appearing EKG.  Recent Labs: 07/25/2020: ALT 49  Recent Lipid Panel    Component Value Date/Time   CHOL 117 07/25/2020 0000   TRIG 90 07/25/2020 0000   HDL 40 07/25/2020 0000   CHOLHDL 2.9 07/25/2020 0000   CHOLHDL 3.3 10/09/2018 0028   VLDL 7 10/09/2018 0028   LDLCALC 60 07/25/2020 0000    Physical Exam:    VS:  BP 126/80   Pulse 61   Ht 5' 9.75" (1.772 m)   Wt 190 lb 3.2 oz (86.3 kg)   SpO2 99%   BMI 27.49 kg/m     Wt Readings from Last 3 Encounters:  11/06/20 190 lb 3.2 oz (86.3 kg)  10/06/19 186 lb 1.9 oz (84.4 kg)  07/06/19 175 lb 6.4 oz (79.6 kg)     GEN: Slightly overweight. No acute distress HEENT: Normal NECK: No JVD. LYMPHATICS: No lymphadenopathy CARDIAC: No murmur. RRR no gallop, or edema. VASCULAR: Venous lake lateral aspect of anterior left shin.  Normal Pulses. No bruits. RESPIRATORY:  Clear to auscultation without rales, wheezing or rhonchi  ABDOMEN: Soft, non-tender, non-distended,  No pulsatile mass, MUSCULOSKELETAL: No deformity  SKIN: Warm and dry NEUROLOGIC:  Alert and oriented x 3 PSYCHIATRIC:  Normal affect   ASSESSMENT:    1. Coronary artery disease involving native coronary artery of native heart with unstable angina pectoris ([place])   2. Hyperlipidemia, unspecified hyperlipidemia type   3. Essential hypertension   4. Thoracic aortic aneurysm without rupture ([place])   5. Educated about COVID-19 virus infection    PLAN:    In order of problems listed  above:  1. Secondary prevention discussed.  Strongly encouraged him to increase physical activity.  He is 150 minutes of moderate activity per week.  Continue Plavix 75 mg/day. 2. LDL target is 55.  Last LDL was 60 in October 2021 on Zetia and Crestor.  Continue same therapy. 3. Blood pressure control is excellent.  He is on low-dose Lopressor 25 mg twice daily.  We will continue that therapy. 4. A recent CT scan of the chest in 2020 did not demonstrate evidence of an aneurysm.  The dilated aorta was over called on echocardiography. 5. He is practicing medication.  He is fully vaccinated and boosted.  Overall education and awareness concerning primary/secondary risk prevention was discussed in detail: LDL less than 70, hemoglobin A1c less than 7, blood pressure target less than 130/80 mmHg, >150 minutes of moderate aerobic activity per week, avoidance of smoking, weight control (via diet and exercise), and continued surveillance/management of/for obstructive sleep apnea.    Medication Adjustments/Labs and Tests Ordered: Current medicines are reviewed at length with the patient today.  Concerns regarding medicines are outlined above.  Orders Placed This Encounter  Procedures  . EKG 12-Lead   No orders of the defined types were placed in this encounter.   Patient Instructions  Medication Instructions:  Your physician recommends that you continue on your current medications as directed. Please refer to the  Current Medication list given to you today.  *If you need a refill on your cardiac medications before your next appointment, please call your pharmacy*   Lab Work: None If you have labs (blood work) drawn today and your tests are completely normal, you will receive your results only by: Marland Kitchen MyChart Message (if you have MyChart) OR . A paper copy in the mail If you have any lab test that is abnormal or we need to change your treatment, we will call you to review the results.   Testing/Procedures: None   Follow-Up: At [facility], you and your health needs are our priority.  As part of our continuing mission to provide you with exceptional heart care, we have created designated Provider Care Teams.  These Care Teams include your primary Cardiologist (physician) and Advanced Practice Providers (APPs -  Physician Assistants and Nurse Practitioners) who all work together to provide you with the care you need, when you need it.  We recommend signing up for the patient portal called "MyChart".  Sign up information is provided on this After Visit Summary.  MyChart is used to connect with patients for Virtual Visits (Telemedicine).  Patients are able to view lab/test results, encounter notes, upcoming appointments, etc.  Non-urgent messages can be sent to your provider as well.   To learn more about what you can do with MyChart, go to [email or web address].    Your next appointment:   1 year(s)  The format for your next appointment:   In Person  Provider:   You may see Sinclair Grooms, MD or one of the following Advanced Practice Providers on your designated Care Team:    Cecilie Kicks, NP  Kathyrn Drown, NP    Other Instructions      Signed, Sinclair Grooms, MD  11/06/2020 9:03 AM    [place]

## 2020-11-06 ENCOUNTER — Encounter: Payer: Self-pay | Admitting: Interventional Cardiology

## 2020-11-06 ENCOUNTER — Other Ambulatory Visit: Payer: Self-pay

## 2020-11-06 ENCOUNTER — Ambulatory Visit: Payer: 59 | Admitting: Interventional Cardiology

## 2020-11-06 VITALS — BP 126/80 | HR 61 | Ht 69.75 in | Wt 190.2 lb

## 2020-11-06 DIAGNOSIS — I25110 Atherosclerotic heart disease of native coronary artery with unstable angina pectoris: Secondary | ICD-10-CM | POA: Diagnosis not present

## 2020-11-06 DIAGNOSIS — I712 Thoracic aortic aneurysm, without rupture, unspecified: Secondary | ICD-10-CM

## 2020-11-06 DIAGNOSIS — I10 Essential (primary) hypertension: Secondary | ICD-10-CM

## 2020-11-06 DIAGNOSIS — E785 Hyperlipidemia, unspecified: Secondary | ICD-10-CM

## 2020-11-06 DIAGNOSIS — Z7189 Other specified counseling: Secondary | ICD-10-CM

## 2020-11-06 NOTE — Patient Instructions (Signed)
Medication Instructions:  Your physician recommends that you continue on your current medications as directed. Please refer to the Current Medication list given to you today.  *If you need a refill on your cardiac medications before your next appointment, please call your pharmacy*   Lab Work: None If you have labs (blood work) drawn today and your tests are completely normal, you will receive your results only by: . MyChart Message (if you have MyChart) OR . A paper copy in the mail If you have any lab test that is abnormal or we need to change your treatment, we will call you to review the results.   Testing/Procedures: None   Follow-Up: At [facility], you and your health needs are our priority.  As part of our continuing mission to provide you with exceptional heart care, we have created designated Provider Care Teams.  These Care Teams include your primary Cardiologist (physician) and Advanced Practice Providers (APPs -  Physician Assistants and Nurse Practitioners) who all work together to provide you with the care you need, when you need it.  We recommend signing up for the patient portal called "MyChart".  Sign up information is provided on this After Visit Summary.  MyChart is used to connect with patients for Virtual Visits (Telemedicine).  Patients are able to view lab/test results, encounter notes, upcoming appointments, etc.  Non-urgent messages can be sent to your provider as well.   To learn more about what you can do with MyChart, go to [email or web address].    Your next appointment:   1 year(s)  The format for your next appointment:   In Person  Provider:   You may see Henry W Smith III, MD or one of the following Advanced Practice Providers on your designated Care Team:    Laura Ingold, NP  Jill McDaniel, NP    Other Instructions   

## 2020-11-28 ENCOUNTER — Other Ambulatory Visit: Payer: Self-pay | Admitting: Interventional Cardiology

## 2020-12-06 NOTE — Progress Notes (Signed)
[facility] Note  12/07/2020     CHIEF COMPLAINT Patient presents for Retina Follow Up   HISTORY OF PRESENT ILLNESS: Andrew Mitchell is a 60 y.o. male who presents to the clinic today for:   HPI    Retina Follow Up    Patient presents with  Other (CME).  In both eyes.  Severity is moderate.  Duration of 8 weeks.  Since onset it is stable.  I, the attending physician,  performed the HPI with the patient and updated documentation appropriately.          Comments    Patient states vision the same OU. Floater OS for the past 4 months or more. Finished Pred Forte. Using dorzolamide bid OU and prolensa bid OU.       Last edited by [name], MD on 12/07/2020  8:21 AM. (History)    pt is using Prolensa and Trusopt per Dr. [name], pt states Dr. [name] is going to do a laser procedure to get rid of a floater in his left eye, but wants to lower the pressure first   Referring physician: [name], MD No address on file  HISTORICAL INFORMATION:   Selected notes from the [facility] Referred by Dr. [name] for eval of CME    CURRENT MEDICATIONS: Current Outpatient Medications (Ophthalmic Drugs)  Medication Sig   prednisoLONE acetate (PRED FORTE) 1 % ophthalmic suspension Place 1 drop into the right eye 2 (two) times daily.   No current facility-administered medications for this visit. (Ophthalmic Drugs)   Current Outpatient Medications (Other)  Medication Sig   buPROPion (WELLBUTRIN XL) 150 MG 24 hr tablet TAKE 1-2 TABLETS BY MOUTH IN THE MORNING   carbidopa-levodopa (SINEMET IR) 25-100 MG per tablet Take 1/2 to 1 tablet by mouth daily before bedtime.   clopidogrel (PLAVIX) 75 MG tablet Take 1 tablet (75 mg total) by mouth daily.   ezetimibe (ZETIA) 10 MG tablet TAKE 1 TABLET BY MOUTH EVERY DAY   Krill Oil 1000 MG CAPS Take 1,000 mg by mouth daily.   metoprolol tartrate (LOPRESSOR) 25 MG tablet TAKE 1 TABLET  BY MOUTH TWICE A DAY   Multiple Vitamins-Minerals (CENTRUM PO) Take 1 tablet by mouth daily.   nitroGLYCERIN (NITROSTAT) 0.4 MG SL tablet Place 1 tablet (0.4 mg total) under the tongue every 5 (five) minutes x 3 doses as needed for chest pain.   rosuvastatin (CRESTOR) 5 MG tablet TAKE 1 TABLET BY MOUTH EVERY DAY   sildenafil (REVATIO) 20 MG tablet 2-5 tablets   vitamin B-12 (CYANOCOBALAMIN) 1000 MCG tablet Take 3,000 mcg by mouth daily.    vitamin C (ASCORBIC ACID) 500 MG tablet Take 500 mg by mouth daily.   No current facility-administered medications for this visit. (Other)      REVIEW OF SYSTEMS: ROS    Positive for: Cardiovascular, Eyes   Negative for: Constitutional, Gastrointestinal, Neurological, Skin, Genitourinary, Musculoskeletal, HENT, Endocrine, Respiratory, Psychiatric, Allergic/Imm, Heme/Lymph   Last edited by Jobe Marker, COT on 12/07/2020  8:13 AM. (History)       ALLERGIES Allergies  Allergen Reactions   Shellfish Allergy Anaphylaxis    Just to shrimp    PAST MEDICAL HISTORY Past Medical History:  Diagnosis Date   Back pain    CAD (coronary artery disease)    a. 09/2018 Inf STEMI/PCI: LM nl, LAD min irregs, D1/2 min irregs, LCX 100ost/p thrombotic (2.75x35 Orsiro DES), RCA 40d, RPAV 90.   Carpal  tunnel syndrome    DDD (degenerative disc disease)    Diastolic dysfunction    a. 09/2018 Echo: EF 50-55%, prob inflat and antlat HK, Gr1 DD. Mildlly dil Ao root (8m). Nl RV fxn.   Hyperlipemia    Hypertensive retinopathy    OU   Mildly Dilated aortic root ([place])    a. 09/2018 Echo: 45mm.   Tubular adenocarcinoma ([place]) 2010   colon   Past Surgical History:  Procedure Laterality Date   CARPAL TUNNEL RELEASE Left 01/06/2013   Procedure: CARPAL TUNNEL RELEASE;  Surgeon: Wynonia Sours, MD;  Location: [facility];  Service: Orthopedics;  Laterality: Left;  ANESTHESIA: IV REGIONAL FAB   CATARACT EXTRACTION Bilateral 2019   Dr.  Peterson Ao Stonecipher   COLONOSCOPY     CORONARY ANGIOGRAPHY N/A 10/08/2018   Procedure: CORONARY ANGIOGRAPHY;  Surgeon: Sherren Mocha, MD;  Location: [facility];  Service: Cardiovascular;  Laterality: N/A;   CORONARY STENT INTERVENTION N/A 10/08/2018   Procedure: CORONARY STENT INTERVENTION;  Surgeon: Sherren Mocha, MD;  Location: [facility];  Service: Cardiovascular;  Laterality: N/A;   CORONARY/GRAFT ACUTE MI REVASCULARIZATION N/A 10/08/2018   Procedure: Coronary/Graft Acute MI Revascularization;  Surgeon: Sherren Mocha, MD;  Location: [facility];  Service: Cardiovascular;  Laterality: N/A;   EYE SURGERY Bilateral 2019   Cat Sx - Dr. [name]   KNEE ARTHROSCOPY     rightx2   RIB RESECTION  2003   thorasic outlet syndrome-rt   SHOULDER ARTHROSCOPY     rightx2   TONSILLECTOMY      FAMILY HISTORY History reviewed. No pertinent family history.  SOCIAL HISTORY Social History   Tobacco Use   Smoking status: Never Smoker   Smokeless tobacco: Never Used  Vaping Use   Vaping Use: Never used  Substance Use Topics   Alcohol use: Yes    Comment: occ   Drug use: No         OPHTHALMIC EXAM:  Base Eye Exam    Visual Acuity (Snellen - Linear)      Right Left   Dist Eleva 20/20 -1 20/20       Tonometry (Tonopen, 8:21 AM)      Right Left   Pressure 18 15       Pupils      Dark Light Shape React APD   Right 4 3 Round Brisk None   Left 4 3 Round Brisk None       Visual Fields (Counting fingers)      Left Right    Full Full       Extraocular Movement      Right Left    Full, Ortho Full, Ortho       Neuro/Psych    Oriented x3: Yes   Mood/Affect: Normal       Dilation    Both eyes: 1.0% Mydriacyl, 2.5% Phenylephrine @ 8:21 AM        Slit Lamp and Fundus Exam    Slit Lamp Exam      Right Left   Lids/Lashes Dermatochalasis - upper lid Dermatochalasis - upper lid   Conjunctiva/Sclera White and quiet White and quiet    Cornea Mild arcus, trace Punctate epithelial erosions, trace Debris in tear film, well healed temporal cataract wounds, fine endo pigment Mild arcus, trace Punctate epithelial erosions, trace Debris in tear film, well healed temporal cataract wounds, trace fine endo pigment   Anterior Chamber Deep and quiet Deep and  quiet   Iris Round and dilated Round and dilated   Lens MF PC IOL in perfect position with open PC MF PC IOL in perfect position with open PC   Vitreous Vitreous syneresis, Posterior vitreous detachment, vitreous condensations -- prominent linear white condensation IT quad Vitreous syneresis, Posterior vitreous detachment, vitreous condensations       Fundus Exam      Right Left   Disc Pink and Sharp, Compact Pink and Sharp   C/D Ratio 0.4 0.5   Macula Flat, good foveal reflex, trace, persistent Cystic changes -- slightly increased, mild RPE mottling and clumping Flat, good foveal reflex, RPE mottling and clumping, No heme or edema   Vessels mild attenuation, mild tortuousity mild attenuation, mild tortuousity   Periphery Attached, No heme, No RT/RD Attached, No heme, No RT/RD          IMAGING AND PROCEDURES  Imaging and Procedures for 12/07/2020  OCT, Retina - OU - Both Eyes       Right Eye Quality was good. Central Foveal Thickness: 355. Progression has worsened. Findings include normal foveal contour, intraretinal fluid, no SRF, vitreomacular adhesion  (Mild interval increase in IRF temporal fovea, +vitreous opacities).   Left Eye Quality was good. Central Foveal Thickness: 337. Progression has been stable. Findings include normal foveal contour, no IRF, no SRF, vitreomacular adhesion  (partial PVD).   Notes *Images captured and stored on drive  Diagnosis / Impression:  OD: Mild interval increase in IRF temporal fovea OS: NFP, no IRF/SRF  Clinical management:  See below  Abbreviations: NFP - Normal foveal profile. CME - cystoid macular edema. PED - pigment  epithelial detachment. IRF - intraretinal fluid. SRF - subretinal fluid. EZ - ellipsoid zone. ERM - epiretinal membrane. ORA - outer retinal atrophy. ORT - outer retinal tubulation. SRHM - subretinal hyper-reflective material. IRHM - intraretinal hyper-reflective material                 ASSESSMENT/PLAN:    ICD-10-CM   1. Cystoid macular edema of right eye  H35.351   2. Retinal edema  H35.81 OCT, Retina - OU - Both Eyes  3. Essential hypertension  I10   4. Hypertensive retinopathy of both eyes  H35.033   5. Pseudophakia, both eyes  Z96.1     1,2. CME OD  - initially diagnosed w/ CME OU by Dr. [name] on 08.16.21 and started on Durezol and BromSite TID OU  - pt reports self d/c of drops 2-3 days prior to initial visit due to improvement in vision  - today BCVA 20/20 OU  - OCT shows mild interval increase in IRF temporal fovea; OS without CME  - re-start PF BID OD only, continue Prolensa BID OU per [facility]  - history of IOP steroid response -- started on Dorzolamide BID OU -- continue  - f/u 6-8 weeks, DFE, OCT  3,4. Hypertensive retinopathy OU - discussed importance of tight BP control - monitor  5. Pseudophakia OU  - s/p CE/IOL OU 2019  - multifocal IOLs in perfect position, doing well  - monitor   Ophthalmic Meds Ordered this visit:  Meds ordered this encounter  Medications   prednisoLONE acetate (PRED FORTE) 1 % ophthalmic suspension    Sig: Place 1 drop into the right eye 2 (two) times daily.    Dispense:  15 mL    Refill:  0       Return for f/u 6-8 weeks, CME OD, DFE, OCT.  There are no Patient  Instructions on file for this visit.   Explained the diagnoses, plan, and follow up with the patient and they expressed understanding.  Patient expressed understanding of the importance of proper follow up care.  This document serves as a record of services personally performed by Gardiner Sleeper, MD, PhD. It was created on their behalf by Leonie Douglas,  an ophthalmic technician. The creation of this record is the provider's dictation and/or activities during the visit.    Electronically signed by: Leonie Douglas COA, 12/07/20  8:45 AM   This document serves as a record of services personally performed by Gardiner Sleeper, MD, PhD. It was created on their behalf by San Jetty. Owens Shark, OA an ophthalmic technician. The creation of this record is the provider's dictation and/or activities during the visit.    Electronically signed by: San Jetty. Owens Shark, [place] 02.24.2022 8:45 AM   Gardiner Sleeper, M.D., Ph.D. Diseases & Surgery of the Retina and [place] 12/07/2020  I have reviewed the above documentation for accuracy and completeness, and I agree with the above. Gardiner Sleeper, M.D., Ph.D. 12/07/20 8:45 AM   Abbreviations: M myopia (nearsighted); A astigmatism; H hyperopia (farsighted); P presbyopia; Mrx spectacle prescription;  CTL contact lenses; OD right eye; OS left eye; OU both eyes  XT exotropia; ET esotropia; PEK punctate epithelial keratitis; PEE punctate epithelial erosions; DES dry eye syndrome; MGD meibomian gland dysfunction; ATs artificial tears; PFAT's preservative free artificial tears; [place] nuclear sclerotic cataract; PSC posterior subcapsular cataract; ERM epi-retinal membrane; PVD posterior vitreous detachment; RD retinal detachment; DM diabetes mellitus; DR diabetic retinopathy; NPDR non-proliferative diabetic retinopathy; PDR proliferative diabetic retinopathy; CSME clinically significant macular edema; DME diabetic macular edema; dbh dot blot hemorrhages; CWS cotton wool spot; POAG primary open angle glaucoma; C/D cup-to-disc ratio; HVF humphrey visual field; GVF goldmann visual field; OCT optical coherence tomography; IOP intraocular pressure; BRVO Branch retinal vein occlusion; CRVO central retinal vein occlusion; CRAO central retinal artery occlusion; BRAO branch retinal artery occlusion; RT retinal tear;  SB scleral buckle; PPV pars plana vitrectomy; VH Vitreous hemorrhage; PRP panretinal laser photocoagulation; IVK intravitreal kenalog; VMT vitreomacular traction; MH Macular hole;  NVD neovascularization of the disc; NVE neovascularization elsewhere; AREDS age related eye disease study; ARMD age related macular degeneration; POAG primary open angle glaucoma; EBMD epithelial/anterior basement membrane dystrophy; ACIOL anterior chamber intraocular lens; IOL intraocular lens; PCIOL posterior chamber intraocular lens; Phaco/IOL phacoemulsification with intraocular lens placement; [place] photorefractive keratectomy; LASIK laser assisted in situ keratomileusis; HTN hypertension; DM diabetes mellitus; COPD chronic obstructive pulmonary disease

## 2020-12-07 ENCOUNTER — Encounter (INDEPENDENT_AMBULATORY_CARE_PROVIDER_SITE_OTHER): Payer: Self-pay | Admitting: Ophthalmology

## 2020-12-07 ENCOUNTER — Ambulatory Visit (INDEPENDENT_AMBULATORY_CARE_PROVIDER_SITE_OTHER): Payer: 59 | Admitting: Ophthalmology

## 2020-12-07 ENCOUNTER — Other Ambulatory Visit: Payer: Self-pay

## 2020-12-07 DIAGNOSIS — H35351 Cystoid macular degeneration, right eye: Secondary | ICD-10-CM

## 2020-12-07 DIAGNOSIS — H3581 Retinal edema: Secondary | ICD-10-CM | POA: Diagnosis not present

## 2020-12-07 DIAGNOSIS — Z961 Presence of intraocular lens: Secondary | ICD-10-CM

## 2020-12-07 DIAGNOSIS — H35033 Hypertensive retinopathy, bilateral: Secondary | ICD-10-CM

## 2020-12-07 DIAGNOSIS — I10 Essential (primary) hypertension: Secondary | ICD-10-CM

## 2020-12-07 MED ORDER — PREDNISOLONE ACETATE 1 % OP SUSP: 1.0000 [drp] | Freq: Two times a day (BID) | OPHTHALMIC | 0 refills | Status: DC

## 2020-12-30 ENCOUNTER — Other Ambulatory Visit: Payer: Self-pay | Admitting: Interventional Cardiology

## 2021-01-16 NOTE — Progress Notes (Signed)
[facility] Note  01/18/2021     CHIEF COMPLAINT Patient presents for Retina Follow Up   HISTORY OF PRESENT ILLNESS: Andrew Mitchell is a 60 y.o. male who presents to the clinic today for:  HPI    Retina Follow Up    Patient presents with  Other.  In right eye.  This started 6 weeks ago.  I, the attending physician,  performed the HPI with the patient and updated documentation appropriately.          Comments    Patient here for 6 weeks retina follow up for CME OD. Patient states vision is better. Still has floater in OS. It bothersome. No eye pain.       Last edited by [name], MD on 01/18/2021  8:32 AM. (History)    Pt is using PF BID OD only, Prolensa BID OU, he is also using a white cap and orange cap drop BID OU, he states they are both for pressure given to him by Dr. [name]  Referring physician: [name], MD No address on file  HISTORICAL INFORMATION:   Selected notes from the [place] Referred by Dr. [name] for eval of CME   CURRENT MEDICATIONS: Current Outpatient Medications (Ophthalmic Drugs)  Medication Sig  . prednisoLONE acetate (PRED FORTE) 1 % ophthalmic suspension Place 1 drop into the right eye 2 (two) times daily.   No current facility-administered medications for this visit. (Ophthalmic Drugs)   Current Outpatient Medications (Other)  Medication Sig  . buPROPion (WELLBUTRIN XL) 150 MG 24 hr tablet TAKE 1-2 TABLETS BY MOUTH IN THE MORNING  . carbidopa-levodopa (SINEMET IR) 25-100 MG per tablet Take 1/2 to 1 tablet by mouth daily before bedtime.  . clopidogrel (PLAVIX) 75 MG tablet TAKE 1 TABLET BY MOUTH EVERY DAY  . ezetimibe (ZETIA) 10 MG tablet TAKE 1 TABLET BY MOUTH EVERY DAY  . Krill Oil 1000 MG CAPS Take 1,000 mg by mouth daily.  . metoprolol tartrate (LOPRESSOR) 25 MG tablet TAKE 1 TABLET BY MOUTH TWICE A DAY  . Multiple Vitamins-Minerals (CENTRUM PO) Take 1 tablet by mouth daily.   . nitroGLYCERIN (NITROSTAT) 0.4 MG SL tablet Place 1 tablet (0.4 mg total) under the tongue every 5 (five) minutes x 3 doses as needed for chest pain.  . rosuvastatin (CRESTOR) 5 MG tablet TAKE 1 TABLET BY MOUTH EVERY DAY  . sildenafil (REVATIO) 20 MG tablet 2-5 tablets  . vitamin B-12 (CYANOCOBALAMIN) 1000 MCG tablet Take 3,000 mcg by mouth daily.   . vitamin C (ASCORBIC ACID) 500 MG tablet Take 500 mg by mouth daily.   No current facility-administered medications for this visit. (Other)      REVIEW OF SYSTEMS: ROS    Positive for: Cardiovascular, Eyes   Negative for: Constitutional, Gastrointestinal, Neurological, Skin, Genitourinary, Musculoskeletal, HENT, Endocrine, Respiratory, Psychiatric, Allergic/Imm, Heme/Lymph   Last edited by Theodore Demark, COA on 01/18/2021  7:55 AM. (History)       ALLERGIES Allergies  Allergen Reactions  . Shellfish Allergy Anaphylaxis    Just to shrimp    PAST MEDICAL HISTORY Past Medical History:  Diagnosis Date  . Back pain   . CAD (coronary artery disease)    a. 09/2018 Inf STEMI/PCI: LM nl, LAD min irregs, D1/2 min irregs, LCX 100ost/p thrombotic (2.75x35 Orsiro DES), RCA 40d, RPAV 90.  . Carpal tunnel syndrome   . DDD (degenerative disc disease)   . Diastolic dysfunction  a. 09/2018 Echo: EF 50-55%, prob inflat and antlat HK, Gr1 DD. Mildlly dil Ao root (33m). Nl RV fxn.  . Hyperlipemia   . Hypertensive retinopathy    OU  . Mildly Dilated aortic root ([place])    a. 09/2018 Echo: 80mm.  . Tubular adenocarcinoma ([place]) 2010   colon   Past Surgical History:  Procedure Laterality Date  . CARPAL TUNNEL RELEASE Left 01/06/2013   Procedure: CARPAL TUNNEL RELEASE;  Surgeon: Wynonia Sours, MD;  Location: [place];  Service: Orthopedics;  Laterality: Left;  ANESTHESIA: IV REGIONAL FAB  . CATARACT EXTRACTION Bilateral 2019   Dr. [name]  . COLONOSCOPY    . CORONARY ANGIOGRAPHY N/A 10/08/2018   Procedure: CORONARY  ANGIOGRAPHY;  Surgeon: Sherren Mocha, MD;  Location: [facility] [facility];  Service: Cardiovascular;  Laterality: N/A;  . CORONARY STENT INTERVENTION N/A 10/08/2018   Procedure: CORONARY STENT INTERVENTION;  Surgeon: Sherren Mocha, MD;  Location: [place] [facility];  Service: Cardiovascular;  Laterality: N/A;  . CORONARY/GRAFT ACUTE MI REVASCULARIZATION N/A 10/08/2018   Procedure: Coronary/Graft Acute MI Revascularization;  Surgeon: Sherren Mocha, MD;  Location: [facility];  Service: Cardiovascular;  Laterality: N/A;  . EYE SURGERY Bilateral 2019   Cat Sx - Dr. [name]  . KNEE ARTHROSCOPY     rightx2  . RIB RESECTION  2003   thorasic outlet syndrome-rt  . SHOULDER ARTHROSCOPY     rightx2  . TONSILLECTOMY      FAMILY HISTORY History reviewed. No pertinent family history.  SOCIAL HISTORY Social History   Tobacco Use  . Smoking status: Never Smoker  . Smokeless tobacco: Never Used  Vaping Use  . Vaping Use: Never used  Substance Use Topics  . Alcohol use: Yes    Comment: occ  . Drug use: No         OPHTHALMIC EXAM:  Base Eye Exam    Visual Acuity (Snellen - Linear)      Right Left   Dist [place] 20/25 -1 20/20 -2   Dist ph  NI        Tonometry (Tonopen, 7:53 AM)      Right Left   Pressure 22 17       Pupils      Dark Light Shape React APD   Right 4 3 Round Brisk None   Left 4 3 Round Brisk None       Visual Fields (Counting fingers)      Left Right    Full Full       Extraocular Movement      Right Left    Full, Ortho Full, Ortho       Neuro/Psych    Oriented x3: Yes   Mood/Affect: Normal       Dilation    Both eyes: 1.0% Mydriacyl, 2.5% Phenylephrine @ 7:53 AM        Slit Lamp and Fundus Exam    Slit Lamp Exam      Right Left   Lids/Lashes Dermatochalasis - upper lid Dermatochalasis - upper lid   Conjunctiva/Sclera White and quiet White and quiet   Cornea Mild arcus, mild  Debris in tear film, well healed temporal  cataract wounds Mild arcus,trace Debris in tear film, well healed temporal cataract wounds   Anterior Chamber Deep and quiet Deep and quiet   Iris Round and dilated Round and dilated   Lens MF PC IOL in perfect position with open PC MF PC  IOL in perfect position with open PC   Vitreous Vitreous syneresis, Posterior vitreous detachment, vitreous condensations -- prominent linear white condensation IT quad Vitreous syneresis, Posterior vitreous detachment, vitreous condensations - very mobile       Fundus Exam      Right Left   Disc Pink and Sharp, Compact Pink and Sharp   C/D Ratio 0.4 0.5   Macula Flat, good foveal reflex, trace, persistent Cystic changes, mild RPE mottling and clumping Flat, good foveal reflex, RPE mottling and clumping, No heme or edema   Vessels mild attenuation, mild tortuousity mild attenuation, mild tortuousity   Periphery Attached, No heme, No RT/RD Attached, No heme, No RT/RD          IMAGING AND PROCEDURES  Imaging and Procedures for 01/18/2021  OCT, Retina - OU - Both Eyes       Right Eye Quality was good. Central Foveal Thickness: 350. Progression has been stable. Findings include normal foveal contour, intraretinal fluid, no SRF, vitreomacular adhesion  (persistent IRF/cystic changes temporal fovea, +vitreous opacities slightly improved).   Left Eye Quality was good. Central Foveal Thickness: 337. Progression has been stable. Findings include normal foveal contour, no IRF, no SRF, vitreomacular adhesion  (partial PVD; tr cystic changes).   Notes *Images captured and stored on drive  Diagnosis / Impression:  OD: persistent IRF/cystic changes temporal fovea, +vitreous opacities slightly improved OS: NFP, no IRF/SRF; tr cystic changes  Clinical management:  See below  Abbreviations: NFP - Normal foveal profile. CME - cystoid macular edema. PED - pigment epithelial detachment. IRF - intraretinal fluid. SRF - subretinal fluid. EZ - ellipsoid zone. ERM -  epiretinal membrane. ORA - outer retinal atrophy. ORT - outer retinal tubulation. SRHM - subretinal hyper-reflective material. IRHM - intraretinal hyper-reflective material                 ASSESSMENT/PLAN:    ICD-10-CM   1. Cystoid macular edema of right eye  H35.351   2. Retinal edema  H35.81 OCT, Retina - OU - Both Eyes  3. Essential hypertension  I10   4. Hypertensive retinopathy of both eyes  H35.033   5. Pseudophakia, both eyes  Z96.1     1,2. CME OD  - initially diagnosed w/ CME OU by Dr. [name] on 08.16.21 and started on Durezol and BromSite TID OU  - pt reports self d/c of drops 2-3 days prior to initial visit due to improvement in vision  - today BCVA 20/25 OD, 20/20 OS  - OCT shows OD: persistent IRF/cystic changes temporal fovea, +vitreous opacities slightly improved OS: NFP, no IRF/SRF; tr cystic changes  - inc PF to TID OD only, increase Prolensa to TID OU  - history of IOP steroid response -- started on Dorzolamide BID OU -- continue  - f/u 4-6 weeks, DFE, OCT  3,4. Hypertensive retinopathy OU - discussed importance of tight BP control - monitor  5. Pseudophakia OU  - s/p CE/IOL OU 2019  - multifocal IOLs in perfect position, doing well  - monitor   Ophthalmic Meds Ordered this visit:  No orders of the defined types were placed in this encounter.      Return for f/u 4-6 weeks, CME OD, DFE, OCT.  There are no Patient Instructions on file for this visit.   Explained the diagnoses, plan, and follow up with the patient and they expressed understanding.  Patient expressed understanding of the importance of proper follow up care.  This document serves as a  record of services personally performed by Gardiner Sleeper, MD, PhD. It was created on their behalf by Estill Bakes, COT an ophthalmic technician. The creation of this record is the provider's dictation and/or activities during the visit.    Electronically signed by: Estill Bakes, COT 4.5.22 @  8:35 AM   This document serves as a record of services personally performed by Gardiner Sleeper, MD, PhD. It was created on their behalf by San Jetty. Owens Shark, OA an ophthalmic technician. The creation of this record is the provider's dictation and/or activities during the visit.    Electronically signed by: San Jetty. Marguerita Merles 04.07.2022 8:35 AM  Gardiner Sleeper, M.D., Ph.D. Diseases & Surgery of the Retina and [place] 01/18/2021   I have reviewed the above documentation for accuracy and completeness, and I agree with the above. Gardiner Sleeper, M.D., Ph.D. 01/18/21 8:35 AM  Abbreviations: M myopia (nearsighted); A astigmatism; H hyperopia (farsighted); P presbyopia; Mrx spectacle prescription;  CTL contact lenses; OD right eye; OS left eye; OU both eyes  XT exotropia; ET esotropia; PEK punctate epithelial keratitis; PEE punctate epithelial erosions; DES dry eye syndrome; MGD meibomian gland dysfunction; ATs artificial tears; PFAT's preservative free artificial tears; [place] nuclear sclerotic cataract; PSC posterior subcapsular cataract; ERM epi-retinal membrane; PVD posterior vitreous detachment; RD retinal detachment; DM diabetes mellitus; DR diabetic retinopathy; NPDR non-proliferative diabetic retinopathy; PDR proliferative diabetic retinopathy; CSME clinically significant macular edema; DME diabetic macular edema; dbh dot blot hemorrhages; CWS cotton wool spot; POAG primary open angle glaucoma; C/D cup-to-disc ratio; HVF humphrey visual field; GVF goldmann visual field; OCT optical coherence tomography; IOP intraocular pressure; BRVO Branch retinal vein occlusion; CRVO central retinal vein occlusion; CRAO central retinal artery occlusion; BRAO branch retinal artery occlusion; RT retinal tear; SB scleral buckle; PPV pars plana vitrectomy; VH Vitreous hemorrhage; PRP panretinal laser photocoagulation; IVK intravitreal kenalog; VMT vitreomacular traction; MH Macular  hole;  NVD neovascularization of the disc; NVE neovascularization elsewhere; AREDS age related eye disease study; ARMD age related macular degeneration; POAG primary open angle glaucoma; EBMD epithelial/anterior basement membrane dystrophy; ACIOL anterior chamber intraocular lens; IOL intraocular lens; PCIOL posterior chamber intraocular lens; Phaco/IOL phacoemulsification with intraocular lens placement; [place] photorefractive keratectomy; LASIK laser assisted in situ keratomileusis; HTN hypertension; DM diabetes mellitus; COPD chronic obstructive pulmonary disease

## 2021-01-18 ENCOUNTER — Other Ambulatory Visit: Payer: Self-pay

## 2021-01-18 ENCOUNTER — Ambulatory Visit (INDEPENDENT_AMBULATORY_CARE_PROVIDER_SITE_OTHER): Payer: 59 | Admitting: Ophthalmology

## 2021-01-18 ENCOUNTER — Encounter (INDEPENDENT_AMBULATORY_CARE_PROVIDER_SITE_OTHER): Payer: Self-pay | Admitting: Ophthalmology

## 2021-01-18 DIAGNOSIS — I10 Essential (primary) hypertension: Secondary | ICD-10-CM | POA: Diagnosis not present

## 2021-01-18 DIAGNOSIS — H35033 Hypertensive retinopathy, bilateral: Secondary | ICD-10-CM

## 2021-01-18 DIAGNOSIS — H35351 Cystoid macular degeneration, right eye: Secondary | ICD-10-CM

## 2021-01-18 DIAGNOSIS — H3581 Retinal edema: Secondary | ICD-10-CM

## 2021-01-18 DIAGNOSIS — Z961 Presence of intraocular lens: Secondary | ICD-10-CM

## 2021-02-16 NOTE — Progress Notes (Shared)
[facility] Note  02/22/2021     CHIEF COMPLAINT Patient presents for No chief complaint on file.   HISTORY OF PRESENT ILLNESS: Andrew Mitchell is a 60 y.o. male who presents to the clinic today for:  Pt is using PF BID OD only, Prolensa BID OU, he is also using a white cap and orange cap drop BID OU, he states they are both for pressure given to him by Dr. [name]  Referring physician: [name], MD [place],  [place] [address]  HISTORICAL INFORMATION:   Selected notes from the MEDICAL RECORD NUMBER Referred by Dr. [name] for eval of CME   CURRENT MEDICATIONS: Current Outpatient Medications (Ophthalmic Drugs)  Medication Sig  . prednisoLONE acetate (PRED FORTE) 1 % ophthalmic suspension Place 1 drop into the right eye 2 (two) times daily.   No current facility-administered medications for this visit. (Ophthalmic Drugs)   Current Outpatient Medications (Other)  Medication Sig  . buPROPion (WELLBUTRIN XL) 150 MG 24 hr tablet TAKE 1-2 TABLETS BY MOUTH IN THE MORNING  . carbidopa-levodopa (SINEMET IR) 25-100 MG per tablet Take 1/2 to 1 tablet by mouth daily before bedtime.  . clopidogrel (PLAVIX) 75 MG tablet TAKE 1 TABLET BY MOUTH EVERY DAY  . ezetimibe (ZETIA) 10 MG tablet TAKE 1 TABLET BY MOUTH EVERY DAY  . Krill Oil 1000 MG CAPS Take 1,000 mg by mouth daily.  . metoprolol tartrate (LOPRESSOR) 25 MG tablet TAKE 1 TABLET BY MOUTH TWICE A DAY  . Multiple Vitamins-Minerals (CENTRUM PO) Take 1 tablet by mouth daily.  . nitroGLYCERIN (NITROSTAT) 0.4 MG SL tablet Place 1 tablet (0.4 mg total) under the tongue every 5 (five) minutes x 3 doses as needed for chest pain.  . rosuvastatin (CRESTOR) 5 MG tablet TAKE 1 TABLET BY MOUTH EVERY DAY  . sildenafil (REVATIO) 20 MG tablet 2-5 tablets  . vitamin B-12 (CYANOCOBALAMIN) 1000 MCG tablet Take 3,000 mcg by mouth daily.   . vitamin C (ASCORBIC ACID) 500 MG tablet Take 500 mg by mouth  daily.   No current facility-administered medications for this visit. (Other)      REVIEW OF SYSTEMS:    ALLERGIES Allergies  Allergen Reactions  . Shellfish Allergy Anaphylaxis    Just to shrimp    PAST MEDICAL HISTORY Past Medical History:  Diagnosis Date  . Back pain   . CAD (coronary artery disease)    a. 09/2018 Inf STEMI/PCI: LM nl, LAD min irregs, D1/2 min irregs, LCX 100ost/p thrombotic (2.75x35 Orsiro DES), RCA 40d, RPAV 90.  . Carpal tunnel syndrome   . DDD (degenerative disc disease)   . Diastolic dysfunction    a. 09/2018 Echo: EF 50-55%, prob inflat and antlat HK, Gr1 DD. Mildlly dil Ao root (27m). Nl RV fxn.  . Hyperlipemia   . Hypertensive retinopathy    OU  . Mildly Dilated aortic root ([place])    a. 09/2018 Echo: 88mm.  . Tubular adenocarcinoma ([place]) 2010   colon   Past Surgical History:  Procedure Laterality Date  . CARPAL TUNNEL RELEASE Left 01/06/2013   Procedure: CARPAL TUNNEL RELEASE;  Surgeon: Wynonia Sours, MD;  Location: [facility];  Service: Orthopedics;  Laterality: Left;  ANESTHESIA: IV REGIONAL FAB  . CATARACT EXTRACTION Bilateral 2019   Dr. [name]  . COLONOSCOPY    . CORONARY ANGIOGRAPHY N/A 10/08/2018   Procedure: CORONARY ANGIOGRAPHY;  Surgeon: Sherren Mocha, MD;  Location: [facility];  Service: Cardiovascular;  Laterality: N/A;  . CORONARY STENT INTERVENTION N/A 10/08/2018   Procedure: CORONARY STENT INTERVENTION;  Surgeon: Sherren Mocha, MD;  Location: [facility];  Service: Cardiovascular;  Laterality: N/A;  . CORONARY/GRAFT ACUTE MI REVASCULARIZATION N/A 10/08/2018   Procedure: Coronary/Graft Acute MI Revascularization;  Surgeon: Sherren Mocha, MD;  Location: [facility];  Service: Cardiovascular;  Laterality: N/A;  . EYE SURGERY Bilateral 2019   Cat Sx - Dr. [name]  . KNEE ARTHROSCOPY     rightx2  . RIB RESECTION  2003   thorasic outlet syndrome-rt  . SHOULDER  ARTHROSCOPY     rightx2  . TONSILLECTOMY      FAMILY HISTORY No family history on file.  SOCIAL HISTORY Social History   Tobacco Use  . Smoking status: Never Smoker  . Smokeless tobacco: Never Used  Vaping Use  . Vaping Use: Never used  Substance Use Topics  . Alcohol use: Yes    Comment: occ  . Drug use: No         OPHTHALMIC EXAM:  Not recorded     IMAGING AND PROCEDURES  Imaging and Procedures for 02/22/2021           ASSESSMENT/PLAN:    ICD-10-CM   1. Cystoid macular edema of right eye  H35.351   2. Retinal edema  H35.81   3. Essential hypertension  I10   4. Hypertensive retinopathy of both eyes  H35.033   5. Pseudophakia, both eyes  Z96.1     1,2. CME OD  - initially diagnosed w/ CME OU by Dr. [name] on 08.16.21 and started on Durezol and BromSite TID OU  - pt reports self d/c of drops 2-3 days prior to initial visit due to improvement in vision  - today BCVA 20/25 OD, 20/20 OS  - OCT shows OD: persistent IRF/cystic changes temporal fovea, +vitreous opacities slightly improved OS: NFP, no IRF/SRF; tr cystic changes  - inc PF to TID OD only, increase Prolensa to TID OU  - history of IOP steroid response -- started on Dorzolamide BID OU -- continue  - f/u 4-6 weeks, DFE, OCT  3,4. Hypertensive retinopathy OU - discussed importance of tight BP control - monitor  5. Pseudophakia OU  - s/p CE/IOL OU 2019  - multifocal IOLs in perfect position, doing well  - monitor   Ophthalmic Meds Ordered this visit:  No orders of the defined types were placed in this encounter.      No follow-ups on file.  There are no Patient Instructions on file for this visit.   Explained the diagnoses, plan, and follow up with the patient and they expressed understanding.  Patient expressed understanding of the importance of proper follow up care.  This document serves as a record of services personally performed by Gardiner Sleeper, MD, PhD. It was created  on their behalf by Leonie Douglas, an ophthalmic technician. The creation of this record is the provider's dictation and/or activities during the visit.    Electronically signed by: Leonie Douglas COA, 02/16/21  12:30 PM  Gardiner Sleeper, M.D., Ph.D. Diseases & Surgery of the Retina and Vitreous [facility] 02/22/2021    Abbreviations: M myopia (nearsighted); A astigmatism; H hyperopia (farsighted); P presbyopia; Mrx spectacle prescription;  CTL contact lenses; OD right eye; OS left eye; OU both eyes  XT exotropia; ET esotropia; PEK punctate epithelial keratitis; PEE punctate epithelial erosions; DES dry eye syndrome;  MGD meibomian gland dysfunction; ATs artificial tears; PFAT's preservative free artificial tears; [place] nuclear sclerotic cataract; PSC posterior subcapsular cataract; ERM epi-retinal membrane; PVD posterior vitreous detachment; RD retinal detachment; DM diabetes mellitus; DR diabetic retinopathy; NPDR non-proliferative diabetic retinopathy; PDR proliferative diabetic retinopathy; CSME clinically significant macular edema; DME diabetic macular edema; dbh dot blot hemorrhages; CWS cotton wool spot; POAG primary open angle glaucoma; C/D cup-to-disc ratio; HVF humphrey visual field; GVF goldmann visual field; OCT optical coherence tomography; IOP intraocular pressure; BRVO Branch retinal vein occlusion; CRVO central retinal vein occlusion; CRAO central retinal artery occlusion; BRAO branch retinal artery occlusion; RT retinal tear; SB scleral buckle; PPV pars plana vitrectomy; VH Vitreous hemorrhage; PRP panretinal laser photocoagulation; IVK intravitreal kenalog; VMT vitreomacular traction; MH Macular hole;  NVD neovascularization of the disc; NVE neovascularization elsewhere; AREDS age related eye disease study; ARMD age related macular degeneration; POAG primary open angle glaucoma; EBMD epithelial/anterior basement membrane dystrophy; ACIOL anterior chamber intraocular  lens; IOL intraocular lens; PCIOL posterior chamber intraocular lens; Phaco/IOL phacoemulsification with intraocular lens placement; [place] photorefractive keratectomy; LASIK laser assisted in situ keratomileusis; HTN hypertension; DM diabetes mellitus; COPD chronic obstructive pulmonary disease

## 2021-02-22 ENCOUNTER — Encounter (INDEPENDENT_AMBULATORY_CARE_PROVIDER_SITE_OTHER): Payer: 59 | Admitting: Ophthalmology

## 2021-02-22 DIAGNOSIS — Z961 Presence of intraocular lens: Secondary | ICD-10-CM

## 2021-02-22 DIAGNOSIS — H35351 Cystoid macular degeneration, right eye: Secondary | ICD-10-CM

## 2021-02-22 DIAGNOSIS — H35033 Hypertensive retinopathy, bilateral: Secondary | ICD-10-CM

## 2021-02-22 DIAGNOSIS — I10 Essential (primary) hypertension: Secondary | ICD-10-CM

## 2021-02-22 DIAGNOSIS — H3581 Retinal edema: Secondary | ICD-10-CM

## 2021-03-01 NOTE — Progress Notes (Signed)
[facility] Note  03/06/2021     CHIEF COMPLAINT Patient presents for Retina Follow Up   HISTORY OF PRESENT ILLNESS: Andrew Mitchell is a 60 y.o. male who presents to the clinic today for:  HPI    Retina Follow Up    Patient presents with  Other.  In right eye.  This started 6.5 weeks ago.  I, the attending physician,  performed the HPI with the patient and updated documentation appropriately.          Comments    Patient here for 6.5 weeks retina follow up for CME OD. Patient states vision doing alright. Has one floater that is bothersome in OS. Both have floaters. No eye pain.        Last edited by [name], MD on 03/06/2021 10:45 PM. (History)    pt states he is using a red and grey top drop in the right eye and a white and orange drop in the left eye, he states he has a floater in his right eye that is very bothersome, he recently has covid, he is seeing Dr. [name] on June 8  Referring physician: [name], MD No address on file  HISTORICAL INFORMATION:   Selected notes from the [facility] Referred by Dr. [name] for eval of CME   CURRENT MEDICATIONS: Current Outpatient Medications (Ophthalmic Drugs)  Medication Sig  . prednisoLONE acetate (PRED FORTE) 1 % ophthalmic suspension Place 1 drop into the right eye 2 (two) times daily.  . prednisoLONE acetate (PRED FORTE) 1 % ophthalmic suspension Place 1 drop into the right eye in the morning, at noon, and at bedtime.   No current facility-administered medications for this visit. (Ophthalmic Drugs)   Current Outpatient Medications (Other)  Medication Sig  . buPROPion (WELLBUTRIN XL) 150 MG 24 hr tablet TAKE 1-2 TABLETS BY MOUTH IN THE MORNING  . carbidopa-levodopa (SINEMET IR) 25-100 MG per tablet Take 1/2 to 1 tablet by mouth daily before bedtime.  . clopidogrel (PLAVIX) 75 MG tablet TAKE 1 TABLET BY MOUTH EVERY DAY  . ezetimibe (ZETIA) 10 MG tablet TAKE 1  TABLET BY MOUTH EVERY DAY  . Krill Oil 1000 MG CAPS Take 1,000 mg by mouth daily.  . metoprolol tartrate (LOPRESSOR) 25 MG tablet TAKE 1 TABLET BY MOUTH TWICE A DAY  . Multiple Vitamins-Minerals (CENTRUM PO) Take 1 tablet by mouth daily.  . nitroGLYCERIN (NITROSTAT) 0.4 MG SL tablet Place 1 tablet (0.4 mg total) under the tongue every 5 (five) minutes x 3 doses as needed for chest pain.  . rosuvastatin (CRESTOR) 5 MG tablet TAKE 1 TABLET BY MOUTH EVERY DAY  . sildenafil (REVATIO) 20 MG tablet 2-5 tablets  . vitamin B-12 (CYANOCOBALAMIN) 1000 MCG tablet Take 3,000 mcg by mouth daily.   . vitamin C (ASCORBIC ACID) 500 MG tablet Take 500 mg by mouth daily.   No current facility-administered medications for this visit. (Other)      REVIEW OF SYSTEMS: ROS    Positive for: Cardiovascular, Eyes   Negative for: Constitutional, Gastrointestinal, Neurological, Skin, Genitourinary, Musculoskeletal, HENT, Endocrine, Respiratory, Psychiatric, Allergic/Imm, Heme/Lymph   Last edited by Theodore Demark, COA on 03/06/2021 12:58 PM. (History)       ALLERGIES Allergies  Allergen Reactions  . Shellfish Allergy Anaphylaxis    Just to shrimp    PAST MEDICAL HISTORY Past Medical History:  Diagnosis Date  . Back pain   . CAD (coronary artery disease)  a. 09/2018 Inf STEMI/PCI: LM nl, LAD min irregs, D1/2 min irregs, LCX 100ost/p thrombotic (2.75x35 Orsiro DES), RCA 40d, RPAV 90.  . Carpal tunnel syndrome   . DDD (degenerative disc disease)   . Diastolic dysfunction    a. 09/2018 Echo: EF 50-55%, prob inflat and antlat HK, Gr1 DD. Mildlly dil Ao root (79m). Nl RV fxn.  . Hyperlipemia   . Hypertensive retinopathy    OU  . Mildly Dilated aortic root ([place])    a. 09/2018 Echo: 63mm.  . Tubular adenocarcinoma ([place]) 2010   colon   Past Surgical History:  Procedure Laterality Date  . CARPAL TUNNEL RELEASE Left 01/06/2013   Procedure: CARPAL TUNNEL RELEASE;  Surgeon: Wynonia Sours, MD;  Location:  [facility];  Service: Orthopedics;  Laterality: Left;  ANESTHESIA: IV REGIONAL FAB  . CATARACT EXTRACTION Bilateral 2019   Dr. [name]  . COLONOSCOPY    . CORONARY ANGIOGRAPHY N/A 10/08/2018   Procedure: CORONARY ANGIOGRAPHY;  Surgeon: Sherren Mocha, MD;  Location: [facility];  Service: Cardiovascular;  Laterality: N/A;  . CORONARY STENT INTERVENTION N/A 10/08/2018   Procedure: CORONARY STENT INTERVENTION;  Surgeon: Sherren Mocha, MD;  Location: [facility];  Service: Cardiovascular;  Laterality: N/A;  . CORONARY/GRAFT ACUTE MI REVASCULARIZATION N/A 10/08/2018   Procedure: Coronary/Graft Acute MI Revascularization;  Surgeon: Sherren Mocha, MD;  Location: [facility];  Service: Cardiovascular;  Laterality: N/A;  . EYE SURGERY Bilateral 2019   Cat Sx - Dr. [name]  . KNEE ARTHROSCOPY     rightx2  . RIB RESECTION  2003   thorasic outlet syndrome-rt  . SHOULDER ARTHROSCOPY     rightx2  . TONSILLECTOMY      FAMILY HISTORY History reviewed. No pertinent family history.  SOCIAL HISTORY Social History   Tobacco Use  . Smoking status: Never Smoker  . Smokeless tobacco: Never Used  Vaping Use  . Vaping Use: Never used  Substance Use Topics  . Alcohol use: Yes    Comment: occ  . Drug use: No         OPHTHALMIC EXAM:  Base Eye Exam    Visual Acuity (Snellen - Linear)      Right Left   Dist [place] 20/20 -2 20/20       Tonometry (Tonopen, 12:56 PM)      Right Left   Pressure 21 21       Pupils      Dark Light Shape React APD   Right 4 3 Round Brisk None   Left 4 3 Round Brisk None       Visual Fields (Counting fingers)      Left Right    Full Full       Extraocular Movement      Right Left    Full, Ortho Full, Ortho       Neuro/Psych    Oriented x3: Yes   Mood/Affect: Normal       Dilation    Both eyes: 1.0% Mydriacyl, 2.5% Phenylephrine @ 12:56 PM        Slit Lamp and Fundus Exam    Slit Lamp  Exam      Right Left   Lids/Lashes Dermatochalasis - upper lid Dermatochalasis - upper lid   Conjunctiva/Sclera White and quiet White and quiet   Cornea Mild arcus, well healed temporal cataract wounds, endo pigment Mild arcus, trace Debris in tear film, well healed temporal cataract wounds  Anterior Chamber Deep and quiet Deep and quiet   Iris Round and dilated Round and dilated   Lens MF PC IOL in perfect position with open PC MF PC IOL in perfect position with open PC   Vitreous Vitreous syneresis, Posterior vitreous detachment, vitreous condensations -- prominent linear white condensation IT quad -- not visualized today Vitreous syneresis, Posterior vitreous detachment, vitreous condensations - very mobile       Fundus Exam      Right Left   Disc Pink and Sharp, Compact Pink and Sharp   C/D Ratio 0.4 0.5   Macula Flat, good foveal reflex, trace, persistent Cystic changes -- slighlty improved, mild RPE mottling and clumping Flat, good foveal reflex, RPE mottling and clumping, No heme or edema   Vessels mild attenuation, mild tortuousity mild attenuation, mild tortuousity   Periphery Attached, No heme, No RT/RD Attached, No heme, No RT/RD          IMAGING AND PROCEDURES  Imaging and Procedures for 03/06/2021  OCT, Retina - OU - Both Eyes       Right Eye Quality was good. Central Foveal Thickness: 355. Progression has been stable. Findings include normal foveal contour, intraretinal fluid, no SRF, vitreomacular adhesion  (Mild interval improvement in cystic changes temporal fovea).   Left Eye Quality was good. Central Foveal Thickness: 337. Progression has been stable. Findings include normal foveal contour, no IRF, no SRF, vitreomacular adhesion  (tr cystic changes).   Notes *Images captured and stored on drive  Diagnosis / Impression:  OD: Mild interval improvement in cystic changes temporal fovea OS: NFP, no IRF/SRF; tr cystic changes  Clinical management:  See  below  Abbreviations: NFP - Normal foveal profile. CME - cystoid macular edema. PED - pigment epithelial detachment. IRF - intraretinal fluid. SRF - subretinal fluid. EZ - ellipsoid zone. ERM - epiretinal membrane. ORA - outer retinal atrophy. ORT - outer retinal tubulation. SRHM - subretinal hyper-reflective material. IRHM - intraretinal hyper-reflective material               ASSESSMENT/PLAN:    ICD-10-CM   1. Cystoid macular edema of right eye  H35.351   2. Retinal edema  H35.81 OCT, Retina - OU - Both Eyes  3. Essential hypertension  I10   4. Hypertensive retinopathy of both eyes  H35.033   5. Pseudophakia, both eyes  Z96.1     1,2. CME OD  - initially diagnosed w/ CME OU by Dr. [name] on 08.16.21 and started on Durezol and BromSite TID OU  - pt reports self d/c of drops 2-3 days prior to initial visit due to improvement in vision  - today BCVA 20/20 OU  - OCT shows OD: Mild interval improvement in cystic changes temporal fovea; OS: NFP, no IRF/SRF; tr cystic changes   - cont Durezol and Prolensa TID OD only  - history of IOP steroid response -- started on Dorzolamide BID OU -- continue  - drop instruction sheet reviewed and given to patient  - f/u 6 weeks, DFE, OCT  3,4. Hypertensive retinopathy OU - discussed importance of tight BP control - monitor  5. Pseudophakia OU  - s/p CE/IOL OU 2019  - multifocal IOLs in perfect position, doing well  - monitor   Ophthalmic Meds Ordered this visit:  No orders of the defined types were placed in this encounter.      Return in about 6 weeks (around 04/17/2021) for f/u CME OD, DFE, OCT.  There are no Patient  Instructions on file for this visit.   Explained the diagnoses, plan, and follow up with the patient and they expressed understanding.  Patient expressed understanding of the importance of proper follow up care.  This document serves as a record of services personally performed by Gardiner Sleeper, MD, PhD. It was  created on their behalf by Estill Bakes, COT an ophthalmic technician. The creation of this record is the provider's dictation and/or activities during the visit.    Electronically signed by: Estill Bakes, COT 5.19.22 @ 10:53 PM   This document serves as a record of services personally performed by Gardiner Sleeper, MD, PhD. It was created on their behalf by San Jetty. Owens Shark, OA an ophthalmic technician. The creation of this record is the provider's dictation and/or activities during the visit.    Electronically signed by: San Jetty. Owens Shark, [place] 05.24.2022 10:53 PM  Gardiner Sleeper, M.D., Ph.D. Diseases & Surgery of the Retina and Vitreous [facility]  I have reviewed the above documentation for accuracy and completeness, and I agree with the above. Gardiner Sleeper, M.D., Ph.D. 03/06/21 10:53 PM   Abbreviations: M myopia (nearsighted); A astigmatism; H hyperopia (farsighted); P presbyopia; Mrx spectacle prescription;  CTL contact lenses; OD right eye; OS left eye; OU both eyes  XT exotropia; ET esotropia; PEK punctate epithelial keratitis; PEE punctate epithelial erosions; DES dry eye syndrome; MGD meibomian gland dysfunction; ATs artificial tears; PFAT's preservative free artificial tears; [place] nuclear sclerotic cataract; PSC posterior subcapsular cataract; ERM epi-retinal membrane; PVD posterior vitreous detachment; RD retinal detachment; DM diabetes mellitus; DR diabetic retinopathy; NPDR non-proliferative diabetic retinopathy; PDR proliferative diabetic retinopathy; CSME clinically significant macular edema; DME diabetic macular edema; dbh dot blot hemorrhages; CWS cotton wool spot; POAG primary open angle glaucoma; C/D cup-to-disc ratio; HVF humphrey visual field; GVF goldmann visual field; OCT optical coherence tomography; IOP intraocular pressure; BRVO Branch retinal vein occlusion; CRVO central retinal vein occlusion; CRAO central retinal artery occlusion; BRAO branch  retinal artery occlusion; RT retinal tear; SB scleral buckle; PPV pars plana vitrectomy; VH Vitreous hemorrhage; PRP panretinal laser photocoagulation; IVK intravitreal kenalog; VMT vitreomacular traction; MH Macular hole;  NVD neovascularization of the disc; NVE neovascularization elsewhere; AREDS age related eye disease study; ARMD age related macular degeneration; POAG primary open angle glaucoma; EBMD epithelial/anterior basement membrane dystrophy; ACIOL anterior chamber intraocular lens; IOL intraocular lens; PCIOL posterior chamber intraocular lens; Phaco/IOL phacoemulsification with intraocular lens placement; [place] photorefractive keratectomy; LASIK laser assisted in situ keratomileusis; HTN hypertension; DM diabetes mellitus; COPD chronic obstructive pulmonary disease

## 2021-03-05 ENCOUNTER — Other Ambulatory Visit (INDEPENDENT_AMBULATORY_CARE_PROVIDER_SITE_OTHER): Payer: Self-pay

## 2021-03-05 ENCOUNTER — Other Ambulatory Visit (INDEPENDENT_AMBULATORY_CARE_PROVIDER_SITE_OTHER): Payer: Self-pay | Admitting: Ophthalmology

## 2021-03-05 MED ORDER — PREDNISOLONE ACETATE 1 % OP SUSP: 1.0000 [drp] | Freq: Three times a day (TID) | OPHTHALMIC | 2 refills | Status: DC

## 2021-03-06 ENCOUNTER — Other Ambulatory Visit: Payer: Self-pay

## 2021-03-06 ENCOUNTER — Encounter (INDEPENDENT_AMBULATORY_CARE_PROVIDER_SITE_OTHER): Payer: Self-pay | Admitting: Ophthalmology

## 2021-03-06 ENCOUNTER — Ambulatory Visit (INDEPENDENT_AMBULATORY_CARE_PROVIDER_SITE_OTHER): Payer: 59 | Admitting: Ophthalmology

## 2021-03-06 DIAGNOSIS — H35351 Cystoid macular degeneration, right eye: Secondary | ICD-10-CM

## 2021-03-06 DIAGNOSIS — Z961 Presence of intraocular lens: Secondary | ICD-10-CM

## 2021-03-06 DIAGNOSIS — H3581 Retinal edema: Secondary | ICD-10-CM

## 2021-03-06 DIAGNOSIS — I10 Essential (primary) hypertension: Secondary | ICD-10-CM

## 2021-03-06 DIAGNOSIS — H35033 Hypertensive retinopathy, bilateral: Secondary | ICD-10-CM | POA: Diagnosis not present

## 2021-04-11 NOTE — Progress Notes (Signed)
[facility] Note  04/17/2021     CHIEF COMPLAINT Patient presents for Retina Follow Up   HISTORY OF PRESENT ILLNESS: Andrew Mitchell is a 60 y.o. male who presents to the clinic today for:  HPI     Retina Follow Up   Patient presents with  Other.  In right eye.  Duration of 6 weeks.  Since onset it is stable.  I, the attending physician,  performed the HPI with the patient and updated documentation appropriately.        Comments   6 week follow up CME OD- Doing well.  Still seeing the "floater" but no changes in it.  Using Prolensa BID and Dorzolamide BID OU      Last edited by [name], MD on 04/17/2021 12:59 PM.    Pt states he is only using Prolensa and Dorzolamide BID OU  Referring physician: [name], MD No address on file  HISTORICAL INFORMATION:   Selected notes from the MEDICAL RECORD NUMBER Referred by Dr. [name] for eval of CME   CURRENT MEDICATIONS: Current Outpatient Medications (Ophthalmic Drugs)  Medication Sig   Loteprednol Etabonate (LOTEMAX SM) 0.38 % GEL Place 1 drop into the right eye 2 (two) times daily.   prednisoLONE acetate (PRED FORTE) 1 % ophthalmic suspension Place 1 drop into the right eye 2 (two) times daily. (Patient not taking: Reported on 04/17/2021)   prednisoLONE acetate (PRED FORTE) 1 % ophthalmic suspension Place 1 drop into the right eye in the morning, at noon, and at bedtime. (Patient not taking: Reported on 04/17/2021)   No current facility-administered medications for this visit. (Ophthalmic Drugs)   Current Outpatient Medications (Other)  Medication Sig   buPROPion (WELLBUTRIN XL) 150 MG 24 hr tablet TAKE 1-2 TABLETS BY MOUTH IN THE MORNING   carbidopa-levodopa (SINEMET IR) 25-100 MG per tablet Take 1/2 to 1 tablet by mouth daily before bedtime.   clopidogrel (PLAVIX) 75 MG tablet TAKE 1 TABLET BY MOUTH EVERY DAY   ezetimibe (ZETIA) 10 MG tablet TAKE 1 TABLET BY MOUTH EVERY DAY    Krill Oil 1000 MG CAPS Take 1,000 mg by mouth daily.   metoprolol tartrate (LOPRESSOR) 25 MG tablet TAKE 1 TABLET BY MOUTH TWICE A DAY   Multiple Vitamins-Minerals (CENTRUM PO) Take 1 tablet by mouth daily.   nitroGLYCERIN (NITROSTAT) 0.4 MG SL tablet Place 1 tablet (0.4 mg total) under the tongue every 5 (five) minutes x 3 doses as needed for chest pain.   rosuvastatin (CRESTOR) 5 MG tablet TAKE 1 TABLET BY MOUTH EVERY DAY   sildenafil (REVATIO) 20 MG tablet 2-5 tablets   vitamin B-12 (CYANOCOBALAMIN) 1000 MCG tablet Take 3,000 mcg by mouth daily.    vitamin C (ASCORBIC ACID) 500 MG tablet Take 500 mg by mouth daily.   No current facility-administered medications for this visit. (Other)      REVIEW OF SYSTEMS:     ALLERGIES Allergies  Allergen Reactions   Shellfish Allergy Anaphylaxis    Just to shrimp    PAST MEDICAL HISTORY Past Medical History:  Diagnosis Date   Back pain    CAD (coronary artery disease)    a. 09/2018 Inf STEMI/PCI: LM nl, LAD min irregs, D1/2 min irregs, LCX 100ost/p thrombotic (2.75x35 Orsiro DES), RCA 40d, RPAV 90.   Carpal tunnel syndrome    DDD (degenerative disc disease)    Diastolic dysfunction    a. 09/2018 Echo: EF 50-55%, prob inflat and antlat  HK, Gr1 DD. Mildlly dil Ao root (54m). Nl RV fxn.   Hyperlipemia    Hypertensive retinopathy    OU   Mildly Dilated aortic root ([place])    a. 09/2018 Echo: 48mm.   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 Peterson Ao [place]   COLONOSCOPY     CORONARY ANGIOGRAPHY N/A 10/08/2018   Procedure: CORONARY ANGIOGRAPHY;  Surgeon: Sherren Mocha, MD;  Location: [place] [facility] [facility];  Service:  Cardiovascular;  Laterality: N/A;   CORONARY STENT INTERVENTION N/A 10/08/2018   Procedure: CORONARY STENT INTERVENTION;  Surgeon: Sherren Mocha, MD;  Location: [place] [facility];  Service: Cardiovascular;  Laterality: N/A;   CORONARY/GRAFT ACUTE MI REVASCULARIZATION N/A 10/08/2018   Procedure: Coronary/Graft Acute MI Revascularization;  Surgeon: Sherren Mocha, MD;  Location: [place] [facility];  Service: Cardiovascular;  Laterality: N/A;   EYE SURGERY Bilateral 2019   Cat Sx - Dr. [name]   KNEE ARTHROSCOPY     rightx2   RIB RESECTION  2003   thorasic outlet syndrome-rt   SHOULDER ARTHROSCOPY     rightx2   TONSILLECTOMY      FAMILY HISTORY History reviewed. No pertinent family history.  SOCIAL HISTORY Social History   Tobacco Use   Smoking status: Never   Smokeless tobacco: Never  Vaping Use   Vaping Use: Never used  Substance Use Topics   Alcohol use: Yes    Comment: occ   Drug use: No         OPHTHALMIC EXAM:  Base Eye Exam     Visual Acuity (Snellen - Linear)       Right Left   Dist [place] 20/25- 20/20-         Tonometry (Tonopen, 8:25 AM)       Right Left   Pressure 17 17         Pupils       Dark Light Shape React APD   Right 4 3 Round Brisk None   Left 4 3 Round Brisk None         Visual Fields (Counting fingers)       Left Right    Full Full         Extraocular Movement       Right Left    Full Full         Neuro/Psych     Oriented x3: Yes   Mood/Affect: Normal         Dilation     Both eyes: 1.0% Mydriacyl, 2.5% Phenylephrine @ 8:25 AM           Slit Lamp and Fundus Exam     Slit Lamp Exam       Right Left   Lids/Lashes Dermatochalasis - upper lid Dermatochalasis - upper lid   Conjunctiva/Sclera White and quiet White and quiet   Cornea Mild arcus, well healed temporal cataract wounds, trace endo pigment Mild arcus, trace Debris in tear film, well healed temporal cataract wounds   Anterior  Chamber Deep and quiet Deep and quiet   Iris Round and dilated Round and dilated   Lens MF PC IOL in perfect position with open PC MF  PC IOL in perfect position with open PC   Vitreous Vitreous syneresis, Posterior vitreous detachment, vitreous condensations -- prominent linear white condensation IT quad -- not visualized today Vitreous syneresis, Posterior vitreous detachment, vitreous condensations - very mobile         Fundus Exam       Right Left   Disc Pink and Sharp, Compact Pink and Sharp   C/D Ratio 0.4 0.5   Macula Flat, good foveal reflex, trace, mild Cystic changes temporal fovea -- slighlty increased, mild RPE mottling and clumping Flat, good foveal reflex, RPE mottling and clumping, No heme or edema   Vessels mild attenuation, mild tortuousity mild attenuation, mild tortuousity   Periphery Attached, No heme, No RT/RD Attached, No heme, No RT/RD            IMAGING AND PROCEDURES  Imaging and Procedures for 04/17/2021  OCT, Retina - OU - Both Eyes       Right Eye Quality was good. Central Foveal Thickness: 375. Progression has worsened. Findings include normal foveal contour, intraretinal fluid, no SRF, vitreomacular adhesion (Interval increase in IRF / cystic changes temporal fovea).   Left Eye Quality was good. Central Foveal Thickness: 340. Progression has been stable. Findings include normal foveal contour, no IRF, no SRF, vitreomacular adhesion .   Notes *Images captured and stored on drive  Diagnosis / Impression:  OD: Interval increase in IRF / cystic changes temporal fovea OS: NFP, no IRF/SRF; tr cystic changes  Clinical management:  See below  Abbreviations: NFP - Normal foveal profile. CME - cystoid macular edema. PED - pigment epithelial detachment. IRF - intraretinal fluid. SRF - subretinal fluid. EZ - ellipsoid zone. ERM - epiretinal membrane. ORA - outer retinal atrophy. ORT - outer retinal tubulation. SRHM - subretinal hyper-reflective material.  IRHM - intraretinal hyper-reflective material             ASSESSMENT/PLAN:    ICD-10-CM   1. Cystoid macular edema of right eye  H35.351     2. Retinal edema  H35.81 OCT, Retina - OU - Both Eyes    3. Essential hypertension  I10     4. Hypertensive retinopathy of both eyes  H35.033     5. Pseudophakia, both eyes  Z96.1        1,2. CME OD  - initially diagnosed w/ CME OU by Dr. [name] on 08.16.21 and started on Durezol and BromSite TID OU  - pt reports self d/c of drops 2-3 days prior to initial visit due to improvement in vision  - today BCVA 20/25 OD  - IOP 17,17  - OCT shows OD: interval increase in IRF / cystic changes temporal fovea; OS: tr cystic changes  - pt states that he is only using prolensa and Dorzolamide BID OU -- not using Durezol  - start Lotemax SM BID OD only and cont Prolensa BID OU  - history of IOP steroid response -- started on Dorzolamide BID OU -- continue  - IOP  - drop instruction sheet reviewed and given to patient  - f/u 6 weeks, DFE, OCT  3,4. Hypertensive retinopathy OU - discussed importance of tight BP control - monitor  5. Pseudophakia OU  - s/p CE/IOL OU 2019  - multifocal IOLs in perfect position, doing well  - monitor   Ophthalmic Meds Ordered this visit:  Meds ordered this encounter  Medications   Loteprednol Etabonate (LOTEMAX SM) 0.38 % GEL    Sig: Place 1 drop into the right  eye 2 (two) times daily.    Dispense:  5 g    Refill:  2      Return in about 6 weeks (around 05/29/2021) for f/u CME OD, DFE, OCT.  There are no Patient Instructions on file for this visit.   Explained the diagnoses, plan, and follow up with the patient and they expressed understanding.  Patient expressed understanding of the importance of proper follow up care.  This document serves as a record of services personally performed by Gardiner Sleeper, MD, PhD. It was created on their behalf by Estill Bakes, COT an ophthalmic technician. The  creation of this record is the provider's dictation and/or activities during the visit.    Electronically signed by: Estill Bakes, COT 6.29.22 @ 1:02 PM   This document serves as a record of services personally performed by Gardiner Sleeper, MD, PhD. It was created on their behalf by San Jetty. Owens Shark, OA an ophthalmic technician. The creation of this record is the provider's dictation and/or activities during the visit.    Electronically signed by: San Jetty. Owens Shark, [place] 07.05.2022 1:02 PM   Gardiner Sleeper, M.D., Ph.D. Diseases & Surgery of the Retina and Vitreous [facility]  I have reviewed the above documentation for accuracy and completeness, and I agree with the above. Gardiner Sleeper, M.D., Ph.D. 04/17/21 1:02 PM   Abbreviations: M myopia (nearsighted); A astigmatism; H hyperopia (farsighted); P presbyopia; Mrx spectacle prescription;  CTL contact lenses; OD right eye; OS left eye; OU both eyes  XT exotropia; ET esotropia; PEK punctate epithelial keratitis; PEE punctate epithelial erosions; DES dry eye syndrome; MGD meibomian gland dysfunction; ATs artificial tears; PFAT's preservative free artificial tears; [place] nuclear sclerotic cataract; PSC posterior subcapsular cataract; ERM epi-retinal membrane; PVD posterior vitreous detachment; RD retinal detachment; DM diabetes mellitus; DR diabetic retinopathy; NPDR non-proliferative diabetic retinopathy; PDR proliferative diabetic retinopathy; CSME clinically significant macular edema; DME diabetic macular edema; dbh dot blot hemorrhages; CWS cotton wool spot; POAG primary open angle glaucoma; C/D cup-to-disc ratio; HVF humphrey visual field; GVF goldmann visual field; OCT optical coherence tomography; IOP intraocular pressure; BRVO Branch retinal vein occlusion; CRVO central retinal vein occlusion; CRAO central retinal artery occlusion; BRAO branch retinal artery occlusion; RT retinal tear; SB scleral buckle; PPV pars plana  vitrectomy; VH Vitreous hemorrhage; PRP panretinal laser photocoagulation; IVK intravitreal kenalog; VMT vitreomacular traction; MH Macular hole;  NVD neovascularization of the disc; NVE neovascularization elsewhere; AREDS age related eye disease study; ARMD age related macular degeneration; POAG primary open angle glaucoma; EBMD epithelial/anterior basement membrane dystrophy; ACIOL anterior chamber intraocular lens; IOL intraocular lens; PCIOL posterior chamber intraocular lens; Phaco/IOL phacoemulsification with intraocular lens placement; [place] photorefractive keratectomy; LASIK laser assisted in situ keratomileusis; HTN hypertension; DM diabetes mellitus; COPD chronic obstructive pulmonary disease

## 2021-04-17 ENCOUNTER — Other Ambulatory Visit (INDEPENDENT_AMBULATORY_CARE_PROVIDER_SITE_OTHER): Payer: Self-pay | Admitting: Ophthalmology

## 2021-04-17 ENCOUNTER — Ambulatory Visit (INDEPENDENT_AMBULATORY_CARE_PROVIDER_SITE_OTHER): Payer: 59 | Admitting: Ophthalmology

## 2021-04-17 ENCOUNTER — Encounter (INDEPENDENT_AMBULATORY_CARE_PROVIDER_SITE_OTHER): Payer: Self-pay | Admitting: Ophthalmology

## 2021-04-17 ENCOUNTER — Other Ambulatory Visit: Payer: Self-pay

## 2021-04-17 DIAGNOSIS — H35033 Hypertensive retinopathy, bilateral: Secondary | ICD-10-CM

## 2021-04-17 DIAGNOSIS — I10 Essential (primary) hypertension: Secondary | ICD-10-CM

## 2021-04-17 DIAGNOSIS — H35351 Cystoid macular degeneration, right eye: Secondary | ICD-10-CM | POA: Diagnosis not present

## 2021-04-17 DIAGNOSIS — Z961 Presence of intraocular lens: Secondary | ICD-10-CM

## 2021-04-17 DIAGNOSIS — H3581 Retinal edema: Secondary | ICD-10-CM

## 2021-04-17 MED ORDER — LOTEMAX SM 0.38 % OP GEL: 1.0000 [drp] | Freq: Two times a day (BID) | OPHTHALMIC | 2 refills | Status: DC

## 2021-05-07 ENCOUNTER — Other Ambulatory Visit (INDEPENDENT_AMBULATORY_CARE_PROVIDER_SITE_OTHER): Payer: Self-pay | Admitting: Ophthalmology

## 2021-05-16 NOTE — Progress Notes (Signed)
[facility] Note  05/18/2021     CHIEF COMPLAINT Patient presents for Retina Follow Up   HISTORY OF PRESENT ILLNESS: Andrew Mitchell is a 60 y.o. male who presents to the clinic today for:  HPI     Retina Follow Up   Patient presents with  Other.  In right eye.  This started 6 weeks ago.  Duration of 6 weeks.  I, the attending physician,  performed the HPI with the patient and updated documentation appropriately.        Comments   Patient here for 6 weeks retina follow up for CME OD. Patient states vision going in and out. Has a floater OS bothersome. OD has something that gets in the way of vision. No eye pain. Just itchy sometimes.      Last edited by [name], MD on 05/21/2021  8:20 AM.    Pt saw Dr. [name] on July 25th, he states his pressure was high in his office, but he had recently been on vacation and not taken his drops with him, he is using Prolensa and dorzolamide in both eyes, he is also using Lotemax in the right eye only, pt states he has a floater in his left eye that is driving him "crazy", he states Dr. [name] wants to do a procedure to break up his floaters, but not until his IOP is lower  Referring physician: [name], MD No address on file  HISTORICAL INFORMATION:   Selected notes from the MEDICAL RECORD NUMBER Referred by Dr. [name] for eval of CME   CURRENT MEDICATIONS: Current Outpatient Medications (Ophthalmic Drugs)  Medication Sig   loteprednol (LOTEMAX) 0.5 % ophthalmic suspension Place 1 drop into the right eye in the morning and at bedtime.   PROLENSA 0.07 % SOLN INSTILL 1 DROP IN RIGHT EYE FOUR TIMES DAILY   No current facility-administered medications for this visit. (Ophthalmic Drugs)   Current Outpatient Medications (Other)  Medication Sig   buPROPion (WELLBUTRIN XL) 150 MG 24 hr tablet TAKE 1-2 TABLETS BY MOUTH IN THE MORNING   carbidopa-levodopa (SINEMET IR) 25-100 MG per tablet  Take 1/2 to 1 tablet by mouth daily before bedtime.   clopidogrel (PLAVIX) 75 MG tablet TAKE 1 TABLET BY MOUTH EVERY DAY   ezetimibe (ZETIA) 10 MG tablet TAKE 1 TABLET BY MOUTH EVERY DAY   Krill Oil 1000 MG CAPS Take 1,000 mg by mouth daily.   metoprolol tartrate (LOPRESSOR) 25 MG tablet TAKE 1 TABLET BY MOUTH TWICE A DAY   Multiple Vitamins-Minerals (CENTRUM PO) Take 1 tablet by mouth daily.   nitroGLYCERIN (NITROSTAT) 0.4 MG SL tablet Place 1 tablet (0.4 mg total) under the tongue every 5 (five) minutes x 3 doses as needed for chest pain.   rosuvastatin (CRESTOR) 5 MG tablet TAKE 1 TABLET BY MOUTH EVERY DAY   sildenafil (REVATIO) 20 MG tablet 2-5 tablets   vitamin B-12 (CYANOCOBALAMIN) 1000 MCG tablet Take 3,000 mcg by mouth daily.    vitamin C (ASCORBIC ACID) 500 MG tablet Take 500 mg by mouth daily.   No current facility-administered medications for this visit. (Other)      REVIEW OF SYSTEMS: ROS   Positive for: Cardiovascular, Eyes Negative for: Constitutional, Gastrointestinal, Neurological, Skin, Genitourinary, Musculoskeletal, HENT, Endocrine, Respiratory, Psychiatric, Allergic/Imm, Heme/Lymph Last edited by Theodore Demark, COA on 05/18/2021  9:27 AM.        ALLERGIES Allergies  Allergen Reactions   Shellfish Allergy Anaphylaxis  Just to shrimp    PAST MEDICAL HISTORY Past Medical History:  Diagnosis Date   Back pain    CAD (coronary artery disease)    a. 09/2018 Inf STEMI/PCI: LM nl, LAD min irregs, D1/2 min irregs, LCX 100ost/p thrombotic (2.75x35 Orsiro DES), RCA 40d, RPAV 90.   Carpal tunnel syndrome    DDD (degenerative disc disease)    Diastolic dysfunction    a. 09/2018 Echo: EF 50-55%, prob inflat and antlat HK, Gr1 DD. Mildlly dil Ao root (27m. Nl RV fxn.   Hyperlipemia    Hypertensive retinopathy    OU   Mildly Dilated [place] root [place]    a. 09/2018 Echo: 416m   Tubular [place] [place]   colon   Past Surgical History:  Procedure  Laterality Date   CARPAL TUNNEL RELEASE Left 01/06/2013   Procedure: CARPAL TUNNEL RELEASE;  Surgeon: GaWynonia SoursMD;  [facility] Service[facility];  Laterality: Left;  ANESTHESIA: IV REGIONAL FAB   CATARACT EXTRACTION Bilateral 2019   Dr. [facility]eterson [place]   COLONOSCOPY     CORONARY ANGIOGRAPHY N/A 10/08/2018   Procedure: CORONARY ANGIOGRAPHY;  Surgeon: CoSherren MochaMD;  [facility];  Service: Cardiovascular;  Laterality: N/A;   CORONARY STENT INTERVENTION N/A 10/08/2018   Procedure: CORONARY STENT INTERVENTION;  Surgeon: CoSherren MochaMD;  [facility];  Service: Cardiovascular;  Laterality: N/A;   CORONARY/GRAFT ACUTE MI REVASCULARIZATION N/A 10/08/2018   Procedure: Coronary/Graft Acute MI Revascularization;  Surgeon: CoSherren MochaMD;  [facility];  Service: Cardiovascular;  Laterality: N/A;   EYE SURGERY Bilateral 2019   Cat Sx - Dr. KaVevelyn Royals KNEE ARTHROSCOPY     rightx2   RIB RESECTION  2003   thorasic outlet syndrome-rt   SHOULDER ARTHROSCOPY     rightx2   TONSILLECTOMY      FAMILY HISTORY History reviewed. No pertinent family history.  SOCIAL HISTORY Social History   Tobacco Use   Smoking status: Never   Smokeless tobacco: Never  Vaping Use   Vaping Use: Never used  Substance Use Topics   Alcohol use: Yes    Comment: occ   Drug use: No         OPHTHALMIC EXAM:  Base Eye Exam     Visual Acuity (Snellen - Linear)       Right Left   Dist [place] 20/20 20/20         Tonometry (Tonopen, 9:23 AM)       Right Left   Pressure 18 16         Pupils       Dark Light Shape React APD   Right 4 3 Round Brisk None   Left 4 3 Round Brisk None         Visual Fields (Counting fingers)       Left Right    Full          Extraocular Movement       Right Left    Full Full         Neuro/Psych     Oriented x3: Yes   Mood/Affect: Normal          Dilation     Both eyes: 1.0% Mydriacyl, 2.5% Phenylephrine @ 9:23 AM           Slit Lamp and Fundus Exam     Slit Lamp Exam  Right Left   Lids/Lashes Dermatochalasis - upper lid Dermatochalasis - upper lid   Conjunctiva/Sclera White and quiet White and quiet   Cornea Mild arcus, well healed temporal cataract wounds, trace endo pigment Mild arcus, trace Debris in tear film, well healed temporal cataract wounds   Anterior Chamber Deep and quiet Deep and quiet   Iris Round and dilated Round and dilated   Lens MF PC IOL in perfect position with open PC MF PC IOL in perfect position with open PC   Vitreous Vitreous syneresis, Posterior vitreous detachment, vitreous condensations -- prominent linear white condensation overlying IT macula Vitreous syneresis, Posterior vitreous detachment, vitreous condensations - very mobile, prominent floater IT         Fundus Exam       Right Left   Disc Pink and Sharp, Compact Pink and Sharp   C/D Ratio 0.4 0.5   Macula Flat, good foveal reflex, trace, mild Cystic changes temporal fovea -- improved, mild RPE mottling and clumping Flat, good foveal reflex, RPE mottling and clumping, No heme or edema   Vessels mild attenuation, mild tortuousity mild attenuation, mild tortuousity   Periphery Attached, No heme, No RT/RD Attached, No heme, No RT/RD            IMAGING AND PROCEDURES  Imaging and Procedures for 05/18/2021  OCT, Retina - OU - Both Eyes       Right Eye Quality was good. Central Foveal Thickness: 354. Progression has improved. Findings include normal foveal contour, intraretinal fluid, no SRF, vitreomacular adhesion (Interval improvement in IRF / cystic changes SN fovea -- trace, residual cystic changes, vitreous opacities).   Left Eye Quality was good. Central Foveal Thickness: 341. Progression has been stable. Findings include normal foveal contour, no IRF, no SRF, vitreomacular adhesion .   Notes *Images captured and  stored on drive  Diagnosis / Impression:  OD: Interval improvement in IRF / cystic changes SN fovea -- trace, residual cystic changes remain; +vitreous opacities OS: NFP, no IRF/SRF; tr cystic changes  Clinical management:  See below  Abbreviations: NFP - Normal foveal profile. CME - cystoid macular edema. PED - pigment epithelial detachment. IRF - intraretinal fluid. SRF - subretinal fluid. EZ - ellipsoid zone. ERM - epiretinal membrane. ORA - outer retinal atrophy. ORT - outer retinal tubulation. SRHM - subretinal hyper-reflective material. IRHM - intraretinal hyper-reflective material            ASSESSMENT/PLAN:    ICD-10-CM   1. Cystoid macular edema of right eye  H35.351     2. Retinal edema  H35.81 OCT, Retina - OU - Both Eyes    3. Vitreous syneresis of both eyes  H43.393     4. Vitreous floaters of both eyes  H43.393     5. Essential hypertension  I10     6. Hypertensive retinopathy of both eyes  H35.033     7. Pseudophakia, both eyes  Z96.1        1,2. CME OD  - initially diagnosed w/ CME OU by Dr. [name] on 08.16.21 and started on Durezol and BromSite TID OU  - pt reports self d/c of drops 2-3 days prior to initial visit due to improvement in vision  - today BCVA 20/20 OD   - IOP 18,16 -- history of steroid response but okay today  - OCT shows OD: interval improvement in IRF / cystic changes temporal fovea; OS: tr cystic changes  - pt states that he is only using prolensa  and Dorzolamide BID OU -- not using Durezol, but Lotemax SM BID OD  - continue Lotemax SM BID OD only and cont Prolensa BID OU  - history of IOP steroid response -- started on Dorzolamide BID OU -- continue  - f/u 4 weeks, DFE, OCT  3,4. Vitreous syneresis / floaters OU  - prominent symptomatic floaters OU  - s/p YAG vitreolysis OD 3.4.2020 ([place])  - discussed possibility of PPV for floaters  - recommend monitoring for now  5,6. Hypertensive retinopathy OU - discussed  importance of tight BP control - monitor  7. Pseudophakia OU  - s/p CE/IOL OU 2019  - multifocal IOLs in perfect position, doing well  - monitor   Ophthalmic Meds Ordered this visit:  No orders of the defined types were placed in this encounter.     Return in about 4 weeks (around 06/15/2021) for f/u CME OD, DFE, OCT.  There are no Patient Instructions on file for this visit.   Explained the diagnoses, plan, and follow up with the patient and they expressed understanding.  Patient expressed understanding of the importance of proper follow up care.  This document serves as a record of services personally performed by Gardiner Sleeper, MD, PhD. It was created on their behalf by San Jetty. Owens Shark, OA an ophthalmic technician. The creation of this record is the provider's dictation and/or activities during the visit.    Electronically signed by: San Jetty. Owens Shark, [place] 08.03.2022 8:29 AM  Gardiner Sleeper, M.D., Ph.D. Diseases & Surgery of the Retina and Vitreous [facility]  I have reviewed the above documentation for accuracy and completeness, and I agree with the above. Gardiner Sleeper, M.D., Ph.D. 05/21/21 8:29 AM   Abbreviations: M myopia (nearsighted); A astigmatism; H hyperopia (farsighted); P presbyopia; Mrx spectacle prescription;  CTL contact lenses; OD right eye; OS left eye; OU both eyes  XT exotropia; ET esotropia; PEK punctate epithelial keratitis; PEE punctate epithelial erosions; DES dry eye syndrome; MGD meibomian gland dysfunction; ATs artificial tears; PFAT's preservative free artificial tears; [place] nuclear sclerotic cataract; PSC posterior subcapsular cataract; ERM epi-retinal membrane; PVD posterior vitreous detachment; RD retinal detachment; DM diabetes mellitus; DR diabetic retinopathy; NPDR non-proliferative diabetic retinopathy; PDR proliferative diabetic retinopathy; CSME clinically significant macular edema; DME diabetic macular edema; dbh dot blot  hemorrhages; CWS cotton wool spot; POAG primary open angle glaucoma; C/D cup-to-disc ratio; HVF humphrey visual field; GVF goldmann visual field; OCT optical coherence tomography; IOP intraocular pressure; BRVO Branch retinal vein occlusion; CRVO central retinal vein occlusion; CRAO central retinal artery occlusion; BRAO branch retinal artery occlusion; RT retinal tear; SB scleral buckle; PPV pars plana vitrectomy; VH Vitreous hemorrhage; PRP panretinal laser photocoagulation; IVK intravitreal kenalog; VMT vitreomacular traction; MH Macular hole;  NVD neovascularization of the disc; NVE neovascularization elsewhere; AREDS age related eye disease study; ARMD age related macular degeneration; POAG primary open angle glaucoma; EBMD epithelial/anterior basement membrane dystrophy; ACIOL anterior chamber intraocular lens; IOL intraocular lens; PCIOL posterior chamber intraocular lens; Phaco/IOL phacoemulsification with intraocular lens placement; [place] photorefractive keratectomy; LASIK laser assisted in situ keratomileusis; HTN hypertension; DM diabetes mellitus; COPD chronic obstructive pulmonary disease

## 2021-05-18 ENCOUNTER — Ambulatory Visit (INDEPENDENT_AMBULATORY_CARE_PROVIDER_SITE_OTHER): Payer: 59 | Admitting: Ophthalmology

## 2021-05-18 ENCOUNTER — Other Ambulatory Visit: Payer: Self-pay

## 2021-05-18 ENCOUNTER — Encounter (INDEPENDENT_AMBULATORY_CARE_PROVIDER_SITE_OTHER): Payer: Self-pay | Admitting: Ophthalmology

## 2021-05-18 DIAGNOSIS — H35033 Hypertensive retinopathy, bilateral: Secondary | ICD-10-CM

## 2021-05-18 DIAGNOSIS — I10 Essential (primary) hypertension: Secondary | ICD-10-CM

## 2021-05-18 DIAGNOSIS — H35351 Cystoid macular degeneration, right eye: Secondary | ICD-10-CM

## 2021-05-18 DIAGNOSIS — H3581 Retinal edema: Secondary | ICD-10-CM

## 2021-05-18 DIAGNOSIS — H43393 Other vitreous opacities, bilateral: Secondary | ICD-10-CM | POA: Diagnosis not present

## 2021-05-18 DIAGNOSIS — Z961 Presence of intraocular lens: Secondary | ICD-10-CM

## 2021-05-21 ENCOUNTER — Encounter (INDEPENDENT_AMBULATORY_CARE_PROVIDER_SITE_OTHER): Payer: Self-pay | Admitting: Ophthalmology

## 2021-05-25 ENCOUNTER — Encounter (INDEPENDENT_AMBULATORY_CARE_PROVIDER_SITE_OTHER): Payer: Self-pay | Admitting: Ophthalmology

## 2021-05-29 ENCOUNTER — Encounter (INDEPENDENT_AMBULATORY_CARE_PROVIDER_SITE_OTHER): Payer: 59 | Admitting: Ophthalmology

## 2021-06-12 ENCOUNTER — Ambulatory Visit: Payer: 59 | Admitting: Podiatry

## 2021-06-12 ENCOUNTER — Other Ambulatory Visit: Payer: Self-pay

## 2021-06-12 ENCOUNTER — Ambulatory Visit (INDEPENDENT_AMBULATORY_CARE_PROVIDER_SITE_OTHER): Payer: 59

## 2021-06-12 DIAGNOSIS — M216X9 Other acquired deformities of unspecified foot: Secondary | ICD-10-CM

## 2021-06-12 DIAGNOSIS — L909 Atrophic disorder of skin, unspecified: Secondary | ICD-10-CM | POA: Diagnosis not present

## 2021-06-12 DIAGNOSIS — M7731 Calcaneal spur, right foot: Secondary | ICD-10-CM | POA: Diagnosis not present

## 2021-06-12 DIAGNOSIS — M722 Plantar fascial fibromatosis: Secondary | ICD-10-CM

## 2021-06-12 MED ORDER — BETAMETHASONE SOD PHOS & ACET 6 (3-3) MG/ML IJ SUSP
6.0000 mg | Freq: Once | INTRAMUSCULAR | Status: AC
  Administered 2021-06-12: 6 mg

## 2021-06-12 NOTE — Patient Instructions (Signed)

## 2021-06-12 NOTE — Progress Notes (Signed)
[facility] Note  06/15/2021     CHIEF COMPLAINT Patient presents for Retina Follow Up   HISTORY OF PRESENT ILLNESS: Andrew Mitchell is a 60 y.o. male who presents to the clinic today for:  HPI     Retina Follow Up   Patient presents with  Other.  In right eye.  This started 4 weeks ago.  I, the attending physician,  performed the HPI with the patient and updated documentation appropriately.        Comments   Patient here for 4 weeks retina follow up for CME OD. Patient states vision still comes and goes with floaters. Floaters get in the way. All else ok. No eye pain.       Last edited by [name], MD on 06/16/2021  5:01 PM.    Pt states vision in right eye is down, he is using Lotemax BID OD only and Prolensa BID OU  Referring physician: [name], MD [place],  [place] [address]  HISTORICAL INFORMATION:   Selected notes from the MEDICAL RECORD NUMBER Referred by Dr. [name] for eval of CME   CURRENT MEDICATIONS: Current Outpatient Medications (Ophthalmic Drugs)  Medication Sig   loteprednol (LOTEMAX) 0.5 % ophthalmic suspension Place 1 drop into the right eye in the morning and at bedtime.   PROLENSA 0.07 % SOLN INSTILL 1 DROP IN RIGHT EYE FOUR TIMES DAILY   No current facility-administered medications for this visit. (Ophthalmic Drugs)   Current Outpatient Medications (Other)  Medication Sig   carbidopa-levodopa (SINEMET IR) 25-100 MG per tablet Take 1/2 to 1 tablet by mouth daily before bedtime.   clopidogrel (PLAVIX) 75 MG tablet TAKE 1 TABLET BY MOUTH EVERY DAY   ezetimibe (ZETIA) 10 MG tablet TAKE 1 TABLET BY MOUTH EVERY DAY   metoprolol tartrate (LOPRESSOR) 25 MG tablet TAKE 1 TABLET BY MOUTH TWICE A DAY   Multiple Vitamins-Minerals (CENTRUM PO) Take 1 tablet by mouth daily.   nitroGLYCERIN (NITROSTAT) 0.4 MG SL tablet Place 1 tablet (0.4 mg total) under the tongue every 5 (five) minutes x 3 doses as  needed for chest pain.   rosuvastatin (CRESTOR) 5 MG tablet TAKE 1 TABLET BY MOUTH EVERY DAY   No current facility-administered medications for this visit. (Other)    REVIEW OF SYSTEMS: ROS   Positive for: Cardiovascular, Eyes Negative for: Constitutional, Gastrointestinal, Neurological, Skin, Genitourinary, Musculoskeletal, HENT, Endocrine, Respiratory, Psychiatric, Allergic/Imm, Heme/Lymph Last edited by Theodore Demark, COA on 06/15/2021  8:56 AM.      ALLERGIES Allergies  Allergen Reactions   Shellfish Allergy Anaphylaxis    Just to shrimp    PAST MEDICAL HISTORY Past Medical History:  Diagnosis Date   Back pain    CAD (coronary artery disease)    a. 09/2018 Inf STEMI/PCI: LM nl, LAD min irregs, D1/2 min irregs, LCX 100ost/p thrombotic (2.75x35 Orsiro DES), RCA 40d, RPAV 90.   Carpal tunnel syndrome    DDD (degenerative disc disease)    Diastolic dysfunction    a. 09/2018 Echo: EF 50-55%, prob inflat and antlat HK, Gr1 DD. Mildlly dil Ao root (14m. Nl RV fxn.   Hyperlipemia    Hypertensive retinopathy    OU   Mildly Dilated aortic root ([facility]    a. 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 Peterson [facility]   COLONOSCOPY     CORONARY ANGIOGRAPHY N/A 10/08/2018   Procedure: CORONARY ANGIOGRAPHY;  Surgeon: Sherren Mocha, MD;  Location: [facility];  Service: Cardiovascular;  Laterality: N/A;   CORONARY STENT INTERVENTION N/A 10/08/2018   Procedure: CORONARY STENT INTERVENTION;  Surgeon: Sherren Mocha, MD;  Location: [facility];  Service: Cardiovascular;  Laterality: N/A;   CORONARY/GRAFT ACUTE MI REVASCULARIZATION N/A  10/08/2018   Procedure: Coronary/Graft Acute MI Revascularization;  Surgeon: Sherren Mocha, MD;  Location: [facility];  Service: Cardiovascular;  Laterality: N/A;   EYE SURGERY Bilateral 2019   Cat Sx - Dr. [name]   KNEE ARTHROSCOPY     rightx2   RIB RESECTION  2003   thorasic outlet syndrome-rt   SHOULDER ARTHROSCOPY     rightx2   TONSILLECTOMY      FAMILY HISTORY History reviewed. No pertinent family history.  SOCIAL HISTORY Social History   Tobacco Use   Smoking status: Never   Smokeless tobacco: Never  Vaping Use   Vaping Use: Never used  Substance Use Topics   Alcohol use: Yes    Comment: occ   Drug use: No         OPHTHALMIC EXAM:  Base Eye Exam     Visual Acuity (Snellen - Linear)       Right Left   Dist [place] 20/30 -2 20/20   Dist ph [place] 20/30 +1          Tonometry (Tonopen, 8:53 AM)       Right Left   Pressure 13 14         Pupils       Dark Light Shape React APD   Right 4 3 Round Brisk None   Left 4 3 Round Brisk None         Visual Fields (Counting fingers)       Left Right    Full Full         Extraocular Movement       Right Left    Full Full         Neuro/Psych     Oriented x3: Yes   Mood/Affect: Normal         Dilation     Both eyes: 1.0% Mydriacyl, 2.5% Phenylephrine @ 8:53 AM           Slit Lamp and Fundus Exam     Slit Lamp Exam       Right Left   Lids/Lashes Dermatochalasis - upper lid Dermatochalasis - upper lid   Conjunctiva/Sclera White and quiet White and quiet   Cornea Mild arcus, well healed temporal cataract wounds, trace endo pigment Mild arcus, trace Debris in tear film, well healed temporal cataract wounds, trace endo pigment   Anterior Chamber Deep, 0.5+pigment Deep and quiet   Iris Round and dilated Round and dilated   Lens MF PC IOL in perfect position with open PC MF PC IOL in perfect position with open PC   Vitreous Vitreous syneresis, Posterior vitreous  detachment, vitreous condensations -- prominent linear white condensation overlying IT macula Vitreous syneresis, Posterior vitreous detachment, vitreous condensations - very mobile, prominent floater IT         Fundus Exam  Right Left   Disc Pink and Sharp, Compact Pink and Sharp   C/D Ratio 0.4 0.5   Macula Flat, good foveal reflex, trace, mild Cystic changes temporal fovea -- slightly increased, mild RPE mottling and clumping Flat, good foveal reflex, RPE mottling and clumping, No heme or edema   Vessels mild attenuation, mild tortuousity mild attenuation, mild tortuousity   Periphery Attached, No heme, No RT/RD Attached, No heme, No RT/RD            IMAGING AND PROCEDURES  Imaging and Procedures for 06/15/2021  OCT, Retina - OU - Both Eyes       Right Eye Quality was good. Central Foveal Thickness: 358. Progression has worsened. Findings include intraretinal fluid, no SRF, vitreomacular adhesion , abnormal foveal contour (Mild Interval increase in IRF / cystic changes temporal fovea, vitreous opacities).   Left Eye Quality was good. Central Foveal Thickness: 334. Progression has been stable. Findings include normal foveal contour, no IRF, no SRF, vitreomacular adhesion .   Notes *Images captured and stored on drive  Diagnosis / Impression:  OD: mild interval inc in IRF / cystic changes SN fovea -- trace, residual cystic changes remain; +vitreous opacities OS: NFP, no IRF/SRF; tr cystic changes  Clinical management:  See below  Abbreviations: NFP - Normal foveal profile. CME - cystoid macular edema. PED - pigment epithelial detachment. IRF - intraretinal fluid. SRF - subretinal fluid. EZ - ellipsoid zone. ERM - epiretinal membrane. ORA - outer retinal atrophy. ORT - outer retinal tubulation. SRHM - subretinal hyper-reflective material. IRHM - intraretinal hyper-reflective material            ASSESSMENT/PLAN:    ICD-10-CM   1. Cystoid macular edema of right  eye  H35.351     2. Retinal edema  H35.81 OCT, Retina - OU - Both Eyes    3. Vitreous syneresis of both eyes  H43.393     4. Vitreous floaters of both eyes  H43.393     5. Essential hypertension  I10     6. Hypertensive retinopathy of both eyes  H35.033     7. Pseudophakia, both eyes  Z96.1       1,2. CME OD  - initially diagnosed w/ CME OU by Dr. [name] on 08.16.21 and started on Durezol and BromSite TID OU  - pt reports self d/c of drops 2-3 days prior to initial visit due to improvement in vision  - today BCVA 20/30 OD   - IOP 13,14 -- history of steroid response but okay today  - OCT shows OD: mild interval increase in IRF / cystic changes temporal fovea; OS: tr cystic changes  - increase Lotemax SM to TID OD only and increase Prolensa to TID OU  - history of IOP steroid response -- on Dorzolamide BID OU -- continue   - f/u 4 weeks, DFE, OCT  3,4. Vitreous syneresis / floaters OU  - prominent symptomatic floaters OU  - s/p YAG vitreolysis OD 3.4.2020 ([facility])  - discussed possibility of PPV for floaters  - recommend monitoring for now   5,6. Hypertensive retinopathy OU - discussed importance of tight BP control - monitor   7. Pseudophakia OU  - s/p CE/IOL OU 2019  - multifocal IOLs in perfect position, doing well  - monitor    Ophthalmic Meds Ordered this visit:  No orders of the defined types were placed in this encounter.     Return in about 4 weeks (around 07/13/2021) for f/u CME OD,  DFE, OCT.  There are no Patient Instructions on file for this visit.   Explained the diagnoses, plan, and follow up with the patient and they expressed understanding.  Patient expressed understanding of the importance of proper follow up care.  This document serves as a record of services personally performed by Gardiner Sleeper, MD, PhD. It was created on their behalf by Leonie Douglas, an ophthalmic technician. The creation of this record is the provider's dictation  and/or activities during the visit.    Electronically signed by: Leonie Douglas COA, 06/16/21  5:08 PM  Gardiner Sleeper, M.D., Ph.D. Diseases & Surgery of the Retina and [place] 06/15/2021  I have reviewed the above documentation for accuracy and completeness, and I agree with the above. Gardiner Sleeper, M.D., Ph.D. 06/16/21 5:08 PM  Abbreviations: M myopia (nearsighted); A astigmatism; H hyperopia (farsighted); P presbyopia; Mrx spectacle prescription;  CTL contact lenses; OD right eye; OS left eye; OU both eyes  XT exotropia; ET esotropia; PEK punctate epithelial keratitis; PEE punctate epithelial erosions; DES dry eye syndrome; MGD meibomian gland dysfunction; ATs artificial tears; PFAT's preservative free artificial tears; Bassett nuclear sclerotic cataract; PSC posterior subcapsular cataract; ERM epi-retinal membrane; PVD posterior vitreous detachment; RD retinal detachment; DM diabetes mellitus; DR diabetic retinopathy; NPDR non-proliferative diabetic retinopathy; PDR proliferative diabetic retinopathy; CSME clinically significant macular edema; DME diabetic macular edema; dbh dot blot hemorrhages; CWS cotton wool spot; POAG primary open angle glaucoma; C/D cup-to-disc ratio; HVF humphrey visual field; GVF goldmann visual field; OCT optical coherence tomography; IOP intraocular pressure; BRVO Branch retinal vein occlusion; CRVO central retinal vein occlusion; CRAO central retinal artery occlusion; BRAO branch retinal artery occlusion; RT retinal tear; SB scleral buckle; PPV pars plana vitrectomy; VH Vitreous hemorrhage; PRP panretinal laser photocoagulation; IVK intravitreal kenalog; VMT vitreomacular traction; MH Macular hole;  NVD neovascularization of the disc; NVE neovascularization elsewhere; AREDS age related eye disease study; ARMD age related macular degeneration; POAG primary open angle glaucoma; EBMD epithelial/anterior basement membrane dystrophy; ACIOL  anterior chamber intraocular lens; IOL intraocular lens; PCIOL posterior chamber intraocular lens; Phaco/IOL phacoemulsification with intraocular lens placement; Anderson photorefractive keratectomy; LASIK laser assisted in situ keratomileusis; HTN hypertension; DM diabetes mellitus; COPD chronic obstructive pulmonary disease

## 2021-06-12 NOTE — Progress Notes (Signed)
  Subjective:  Patient ID: ASPYN SCHRAG, male    DOB: November 08, 1960,  MRN: [id number]  Chief Complaint  Patient presents with   Plantar Fasciitis    Right heel pain x 6 month.    60 y.o. male presents with the above complaint. History confirmed with patient. Has been treated by another provider with an injection that lasted for about 3 months.  Objective:  Physical Exam: warm, good capillary refill, no trophic changes or ulcerative lesions, normal DP and PT pulses, and normal sensory exam. Right Foot: tenderness to palpation medial calcaneal tuber, no pain with calcaneal squeeze, decreased ankle joint ROM, and +Silverskiold test mild crepitus right 1st MPJ without pain on ROM or POP  Radiographs: X-ray of the right foot: no evidence of calcaneal stress fracture, no plantar calcaneal heel spur, Haglund deformity noted, and midfoot Pandora break, 1st MPJ degenerative changes subchondral sclerosis 1st proximal phalanx, dorsal spur 1st metatarsal  Assessment:   1. Plantar fasciitis of right foot   2. Equinus deformity of foot   3. Calcaneal spur of right foot   4. Fat pad atrophy of foot    Plan:  Patient was evaluated and treated and all questions answered.  Plantar Fasciitis -XR reviewed with patient -Educated patient on stretching and icing of the affected limb -Injection delivered to the plantar fascia of the right foot. -Heel pads applied to patient's insert.  Procedure: Injection Tendon/Ligament Consent: Verbal consent obtained. Location: Right plantar fascia at the glabrous junction; medial approach. Skin Prep: Alcohol. Injectate: 1 cc 0.5% marcaine plain, 1 cc betamethasone acetate-betamethasone sodium phosphate Disposition: Patient tolerated procedure well. Injection site dressed with a band-aid.  Return in about 4 weeks (around 07/10/2021).

## 2021-06-15 ENCOUNTER — Encounter (INDEPENDENT_AMBULATORY_CARE_PROVIDER_SITE_OTHER): Payer: Self-pay | Admitting: Ophthalmology

## 2021-06-15 ENCOUNTER — Other Ambulatory Visit: Payer: Self-pay

## 2021-06-15 ENCOUNTER — Ambulatory Visit (INDEPENDENT_AMBULATORY_CARE_PROVIDER_SITE_OTHER): Payer: 59 | Admitting: Ophthalmology

## 2021-06-15 DIAGNOSIS — H35351 Cystoid macular degeneration, right eye: Secondary | ICD-10-CM

## 2021-06-15 DIAGNOSIS — H35033 Hypertensive retinopathy, bilateral: Secondary | ICD-10-CM

## 2021-06-15 DIAGNOSIS — H3581 Retinal edema: Secondary | ICD-10-CM

## 2021-06-15 DIAGNOSIS — I10 Essential (primary) hypertension: Secondary | ICD-10-CM

## 2021-06-15 DIAGNOSIS — H43393 Other vitreous opacities, bilateral: Secondary | ICD-10-CM

## 2021-06-15 DIAGNOSIS — Z961 Presence of intraocular lens: Secondary | ICD-10-CM

## 2021-06-16 ENCOUNTER — Encounter (INDEPENDENT_AMBULATORY_CARE_PROVIDER_SITE_OTHER): Payer: Self-pay | Admitting: Ophthalmology

## 2021-07-10 NOTE — Progress Notes (Signed)
[facility] Note  07/13/2021     CHIEF COMPLAINT Patient presents for Retina Follow Up   HISTORY OF PRESENT ILLNESS: Andrew Mitchell is a 60 y.o. male who presents to the clinic today for:  HPI     Retina Follow Up   Patient presents with  Other.  In right eye.  This started 4 weeks ago.  I, the attending physician,  performed the HPI with the patient and updated documentation appropriately.        Comments   Patient here for 4 weeks  retina follow up for  CME OD. Patient states vision not as good today. Yesterday hard to see, focus. Today not so bad. No eye pain.      Last edited by [name], MD on 07/13/2021  2:48 PM.     Pt states he is using Lotemax TID OD, Prolensa QID OD and orange cap OU BID, he played golf yesterday and was unable to find the ball after he hit it  Referring physician: [name], MD No address on file  HISTORICAL INFORMATION:   Selected notes from the [place] Referred by Dr. [name] for eval of CME   CURRENT MEDICATIONS: Current Outpatient Medications (Ophthalmic Drugs)  Medication Sig   Bromfenac Sodium (PROLENSA) 0.07 % SOLN INSTILL 1 DROP IN RIGHT EYE FOUR TIMES DAILY   loteprednol (LOTEMAX) 0.5 % ophthalmic suspension Place 1 drop into the right eye 3 (three) times daily.   No current facility-administered medications for this visit. (Ophthalmic Drugs)   Current Outpatient Medications (Other)  Medication Sig   carbidopa-levodopa (SINEMET IR) 25-100 MG per tablet Take 1/2 to 1 tablet by mouth daily before bedtime.   clopidogrel (PLAVIX) 75 MG tablet TAKE 1 TABLET BY MOUTH EVERY DAY   ezetimibe (ZETIA) 10 MG tablet TAKE 1 TABLET BY MOUTH EVERY DAY   metoprolol tartrate (LOPRESSOR) 25 MG tablet TAKE 1 TABLET BY MOUTH TWICE A DAY   Multiple Vitamins-Minerals (CENTRUM PO) Take 1 tablet by mouth daily.   nitroGLYCERIN (NITROSTAT) 0.4 MG SL tablet Place 1 tablet (0.4 mg total) under the  tongue every 5 (five) minutes x 3 doses as needed for chest pain.   rosuvastatin (CRESTOR) 5 MG tablet TAKE 1 TABLET BY MOUTH EVERY DAY   No current facility-administered medications for this visit. (Other)    REVIEW OF SYSTEMS: ROS   Positive for: Cardiovascular, Eyes Negative for: Constitutional, Gastrointestinal, Neurological, Skin, Genitourinary, Musculoskeletal, HENT, Endocrine, Respiratory, Psychiatric, Allergic/Imm, Heme/Lymph Last edited by Theodore Demark, COA on 07/13/2021  7:51 AM.     ALLERGIES Allergies  Allergen Reactions   Shellfish Allergy Anaphylaxis    Just to shrimp   PAST MEDICAL HISTORY Past Medical History:  Diagnosis Date   Back pain    CAD (coronary artery disease)    a. 09/2018 Inf STEMI/PCI: LM nl, LAD min irregs, D1/2 min irregs, LCX 100ost/p thrombotic (2.75x35 Orsiro DES), RCA 40d, RPAV 90.   Carpal tunnel syndrome    DDD (degenerative disc disease)    Diastolic dysfunction    a. 09/2018 Echo: EF 50-55%, prob inflat and antlat HK, Gr1 DD. Mildlly dil Ao root (63m). Nl RV fxn.   Hyperlipemia    Hypertensive retinopathy    OU   Mildly Dilated aortic root ([place])    a. 09/2018 Echo: 49mm.   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 Peterson [facility]   COLONOSCOPY     CORONARY ANGIOGRAPHY N/A 10/08/2018   Procedure: CORONARY ANGIOGRAPHY;  Surgeon: Sherren Mocha, MD;  Location: [place] [facility];  Service: Cardiovascular;  Laterality: N/A;   CORONARY STENT INTERVENTION N/A 10/08/2018   Procedure: CORONARY STENT INTERVENTION;  Surgeon: Sherren Mocha, MD;  Location: [place] [facility];  Service: Cardiovascular;  Laterality: N/A;    CORONARY/GRAFT ACUTE MI REVASCULARIZATION N/A 10/08/2018   Procedure: Coronary/Graft Acute MI Revascularization;  Surgeon: Sherren Mocha, MD;  Location: [place] [facility];  Service: Cardiovascular;  Laterality: N/A;   EYE SURGERY Bilateral 2019   Cat Sx - Dr. [name]   KNEE ARTHROSCOPY     rightx2   RIB RESECTION  2003   thorasic outlet syndrome-rt   SHOULDER ARTHROSCOPY     rightx2   TONSILLECTOMY      FAMILY HISTORY History reviewed. No pertinent family history.  SOCIAL HISTORY Social History   Tobacco Use   Smoking status: Never   Smokeless tobacco: Never  Vaping Use   Vaping Use: Never used  Substance Use Topics   Alcohol use: Yes    Comment: occ   Drug use: No       OPHTHALMIC EXAM:  Base Eye Exam     Visual Acuity (Snellen - Linear)       Right Left   Dist [place] 20/40 20/20   Dist ph [place] 20/30 +1          Tonometry (Tonopen, 7:49 AM)       Right Left   Pressure 21 17         Pupils       Dark Light Shape React APD   Right 4 3 Round Brisk None   Left 4 3 Round Brisk None         Visual Fields (Counting fingers)       Left Right    Full Full         Extraocular Movement       Right Left    Full Full         Neuro/Psych     Oriented x3: Yes   Mood/Affect: Normal         Dilation     Both eyes: 1.0% Mydriacyl, 2.5% Phenylephrine @ 7:48 AM           Slit Lamp and Fundus Exam     Slit Lamp Exam       Right Left   Lids/Lashes Dermatochalasis - upper lid Dermatochalasis - upper lid   Conjunctiva/Sclera White and quiet White and quiet   Cornea Mild arcus, well healed temporal cataract wounds, trace endo pigment Mild arcus, trace Debris in tear film, well healed temporal cataract wounds, trace endo pigment   Anterior Chamber Deep, 0.5+pigment Deep and quiet   Iris Round and dilated Round and dilated   Lens MF PC IOL in perfect position with open PC MF PC IOL in perfect position with open PC   Vitreous  Vitreous syneresis, Posterior vitreous detachment, vitreous condensations -- prominent linear white condensation overlying IT macula Vitreous syneresis, Posterior vitreous detachment, vitreous condensations - very mobile, prominent floater IT         Fundus Exam  Right Left   Disc Pink and Sharp, Compact Pink and Sharp   C/D Ratio 0.4 0.5   Macula Flat, blunted foveal reflex, mild Cystic changes temporal fovea -- increased, mild RPE mottling and clumping Flat, good foveal reflex, RPE mottling and clumping, No heme or edema   Vessels mild attenuation, mild tortuousity mild attenuation, mild tortuousity   Periphery Attached, No heme, No RT/RD Attached, No heme, No RT/RD            IMAGING AND PROCEDURES  Imaging and Procedures for 07/13/2021  OCT, Retina - OU - Both Eyes       Right Eye Quality was good. Central Foveal Thickness: 430. Progression has worsened. Findings include intraretinal fluid, no SRF, vitreomacular adhesion , abnormal foveal contour (Interval increase in central IRF / CME temporal fovea, vitreous opacities).   Left Eye Quality was good. Central Foveal Thickness: 334. Progression has been stable. Findings include normal foveal contour, no IRF, no SRF, vitreomacular adhesion .   Notes *Images captured and stored on drive  Diagnosis / Impression:  OD: Interval increase in central IRF / CME temporal fovea, vitreous opacities OS: NFP, no IRF/SRF; tr cystic changes  Clinical management:  See below  Abbreviations: NFP - Normal foveal profile. CME - cystoid macular edema. PED - pigment epithelial detachment. IRF - intraretinal fluid. SRF - subretinal fluid. EZ - ellipsoid zone. ERM - epiretinal membrane. ORA - outer retinal atrophy. ORT - outer retinal tubulation. SRHM - subretinal hyper-reflective material. IRHM - intraretinal hyper-reflective material      Injection into Tenon's Capsule - OD - Right Eye       Time Out 07/13/2021. 8:39 AM. Confirmed  correct patient, procedure, site, and patient consented.   Anesthesia Topical anesthesia was used. Anesthetic medications included Lidocaine 2%, Proparacaine 0.5%.   Procedure Preparation included 5% betadine to ocular surface, eyelid speculum. A 27 gauge needle was used.   Injection: 40 mg triamcinolone acetonide 40 MG/ML   Route: Intravitreal, Site: Right Eye   NDC: [id number], Lot: [id number], Expiration date: 03/14/2023   Post-op Post injection exam found visual acuity of at least counting fingers. The patient tolerated the procedure well. There were no complications. The patient received written and verbal post procedure care education. Post injection medications were not given.   Notes 1 cc of Kenalog-40 (40 mg) was injected into subtenon's capsule in the superotemporal quadrant. Betadine was applied to Injection area pre and post-injection then rinsed with sterile BSS. 1 drop of polymixin was instilled into the eye. There were no complications. Pt tolerated procedure well.            ASSESSMENT/PLAN:    ICD-10-CM   1. Cystoid macular edema of right eye  H35.351 Injection into Tenon's Capsule - OD - Right Eye    triamcinolone acetonide (KENALOG-40) injection 40 mg    2. Retinal edema  H35.81 OCT, Retina - OU - Both Eyes    3. Vitreous syneresis of both eyes  H43.393     4. Vitreous floaters of both eyes  H43.393     5. Essential hypertension  I10     6. Hypertensive retinopathy of both eyes  H35.033     7. Pseudophakia, both eyes  Z96.1      1,2. CME OD  - initially diagnosed w/ CME OU by Dr. [name] on 08.16.21 and started on Durezol and BromSite TID OU  - pt reports self d/c of drops 2-3 days prior to initial visit due to improvement  in vision  - today BCVA 20/30 OD -- stable  - IOP 21,17 -- history of steroid response but okay today  - currently on Lotemax TID OD, Prolensa TID OD, Dorz BID OU  - OCT shows OD: mild interval increase in IRF / CME  temporal  fovea; OS: tr cystic changes  - recommend STK OD #1 today, 09.30.22  - pt wishes to proceed with injection  - RBA of procedure discussed, questions answered - informed consent obtained and signed - see procedure note  - continue Lotemax SM TID OD only and Prolensa TID OD  - history of IOP steroid response -- on Dorzolamide BID OU -- continue   - f/u 4-6 weeks, DFE, OCT  3,4. Vitreous syneresis / floaters OU  - prominent symptomatic floaters OU  - s/p YAG vitreolysis OD 3.4.2020 ([facility])  - discussed possibility of PPV for floaters  - recommend monitoring for now  5,6. Hypertensive retinopathy OU - discussed importance of tight BP control - monitor   7. Pseudophakia OU  - s/p CE/IOL OU 2019  - multifocal IOLs in perfect position, doing well  - monitor     Ophthalmic Meds Ordered this visit:  Meds ordered this encounter  Medications   Bromfenac Sodium (PROLENSA) 0.07 % SOLN    Sig: INSTILL 1 DROP IN RIGHT EYE FOUR TIMES DAILY    Dispense:  3 mL    Refill:  2   loteprednol (LOTEMAX) 0.5 % ophthalmic suspension    Sig: Place 1 drop into the right eye 3 (three) times daily.    Dispense:  10 mL    Refill:  2   triamcinolone acetonide (KENALOG-40) injection 40 mg      Return for f/u 4-6 weeks, CME OD, DFE, OCT.  There are no Patient Instructions on file for this visit.   Explained the diagnoses, plan, and follow up with the patient and they expressed understanding.  Patient expressed understanding of the importance of proper follow up care.  This document serves as a record of services personally performed by Gardiner Sleeper, MD, PhD. It was created on their behalf by Leonie Douglas, an ophthalmic technician. The creation of this record is the provider's dictation and/or activities during the visit.    Electronically signed by: Leonie Douglas COA, 07/13/21  3:20 PM  Gardiner Sleeper, M.D., Ph.D. Diseases & Surgery of the Retina and [place] 07/13/2021  I have reviewed the above documentation for accuracy and completeness, and I agree with the above. Gardiner Sleeper, M.D., Ph.D. 07/13/21 3:20 PM  Abbreviations: M myopia (nearsighted); A astigmatism; H hyperopia (farsighted); P presbyopia; Mrx spectacle prescription;  CTL contact lenses; OD right eye; OS left eye; OU both eyes  XT exotropia; ET esotropia; PEK punctate epithelial keratitis; PEE punctate epithelial erosions; DES dry eye syndrome; MGD meibomian gland dysfunction; ATs artificial tears; PFAT's preservative free artificial tears; Delta nuclear sclerotic cataract; PSC posterior subcapsular cataract; ERM epi-retinal membrane; PVD posterior vitreous detachment; RD retinal detachment; DM diabetes mellitus; DR diabetic retinopathy; NPDR non-proliferative diabetic retinopathy; PDR proliferative diabetic retinopathy; CSME clinically significant macular edema; DME diabetic macular edema; dbh dot blot hemorrhages; CWS cotton wool spot; POAG primary open angle glaucoma; C/D cup-to-disc ratio; HVF humphrey visual field; GVF goldmann visual field; OCT optical coherence tomography; IOP intraocular pressure; BRVO Branch retinal vein occlusion; CRVO central retinal vein occlusion; CRAO central retinal artery occlusion; BRAO branch retinal artery occlusion; RT retinal tear; SB scleral buckle;  PPV pars plana vitrectomy; VH Vitreous hemorrhage; PRP panretinal laser photocoagulation; IVK intravitreal kenalog; VMT vitreomacular traction; MH Macular hole;  NVD neovascularization of the disc; NVE neovascularization elsewhere; AREDS age related eye disease study; ARMD age related macular degeneration; POAG primary open angle glaucoma; EBMD epithelial/anterior basement membrane dystrophy; ACIOL anterior chamber intraocular lens; IOL intraocular lens; PCIOL posterior chamber intraocular lens; Phaco/IOL phacoemulsification with intraocular lens placement; [place] photorefractive keratectomy; LASIK laser assisted  in situ keratomileusis; HTN hypertension; DM diabetes mellitus; COPD chronic obstructive pulmonary disease

## 2021-07-13 ENCOUNTER — Encounter (INDEPENDENT_AMBULATORY_CARE_PROVIDER_SITE_OTHER): Payer: Self-pay | Admitting: Ophthalmology

## 2021-07-13 ENCOUNTER — Other Ambulatory Visit: Payer: Self-pay

## 2021-07-13 ENCOUNTER — Ambulatory Visit (INDEPENDENT_AMBULATORY_CARE_PROVIDER_SITE_OTHER): Payer: 59 | Admitting: Ophthalmology

## 2021-07-13 DIAGNOSIS — Z961 Presence of intraocular lens: Secondary | ICD-10-CM

## 2021-07-13 DIAGNOSIS — H35033 Hypertensive retinopathy, bilateral: Secondary | ICD-10-CM | POA: Diagnosis not present

## 2021-07-13 DIAGNOSIS — H35351 Cystoid macular degeneration, right eye: Secondary | ICD-10-CM

## 2021-07-13 DIAGNOSIS — I10 Essential (primary) hypertension: Secondary | ICD-10-CM

## 2021-07-13 DIAGNOSIS — H43393 Other vitreous opacities, bilateral: Secondary | ICD-10-CM

## 2021-07-13 DIAGNOSIS — H3581 Retinal edema: Secondary | ICD-10-CM

## 2021-07-13 MED ORDER — PROLENSA 0.07 % OP SOLN: OPHTHALMIC | 2 refills | Status: DC

## 2021-07-13 MED ORDER — TRIAMCINOLONE ACETONIDE 40 MG/ML IJ SUSP FOR KALEIDOSCOPE
40.0000 mg | INTRAMUSCULAR | Status: AC | PRN
  Administered 2021-07-13: 40 mg via INTRAVITREAL

## 2021-07-13 MED ORDER — LOTEPREDNOL ETABONATE 0.5 % OP SUSP: 1.0000 [drp] | Freq: Three times a day (TID) | OPHTHALMIC | 2 refills | Status: DC

## 2021-07-17 ENCOUNTER — Ambulatory Visit: Payer: 59 | Admitting: Podiatry

## 2021-08-06 NOTE — Progress Notes (Signed)
[facility] Note  08/10/2021     CHIEF COMPLAINT Patient presents for Retina Follow Up  HISTORY OF PRESENT ILLNESS: Andrew Mitchell is a 60 y.o. male who presents to the clinic today for:  HPI     Retina Follow Up   Patient presents with  Other.  In right eye.  This started 4 weeks ago.  I, the attending physician,  performed the HPI with the patient and updated documentation appropriately.        Comments   Patient here for 4 weeks retina follow up for CME OD. Patient states vision comes and goes. Ran out of drops last night was able to use them. Still has floaters. No eye pain.       Last edited by [name], MD on 08/12/2021  2:00 AM.    Pt feels like vision is improving, he ran out of drops last night, so he has not used any today, he has floaters in both eyes that are still bothering him  Referring physician: [name], MD No address on file  HISTORICAL INFORMATION:  Selected notes from the [facility] Referred by Dr. [name] for eval of CME   CURRENT MEDICATIONS: Current Outpatient Medications (Ophthalmic Drugs)  Medication Sig   Bromfenac Sodium (PROLENSA) 0.07 % SOLN INSTILL 1 DROP IN RIGHT EYE FOUR TIMES DAILY   loteprednol (LOTEMAX) 0.5 % ophthalmic suspension Place 1 drop into the right eye 3 (three) times daily.   No current facility-administered medications for this visit. (Ophthalmic Drugs)   Current Outpatient Medications (Other)  Medication Sig   carbidopa-levodopa (SINEMET IR) 25-100 MG per tablet Take 1/2 to 1 tablet by mouth daily before bedtime.   clopidogrel (PLAVIX) 75 MG tablet TAKE 1 TABLET BY MOUTH EVERY DAY   ezetimibe (ZETIA) 10 MG tablet TAKE 1 TABLET BY MOUTH EVERY DAY   metoprolol tartrate (LOPRESSOR) 25 MG tablet TAKE 1 TABLET BY MOUTH TWICE A DAY   Multiple Vitamins-Minerals (CENTRUM PO) Take 1 tablet by mouth daily.   nitroGLYCERIN (NITROSTAT) 0.4 MG SL tablet Place 1 tablet (0.4  mg total) under the tongue every 5 (five) minutes x 3 doses as needed for chest pain.   rosuvastatin (CRESTOR) 5 MG tablet TAKE 1 TABLET BY MOUTH EVERY DAY   No current facility-administered medications for this visit. (Other)   REVIEW OF SYSTEMS: ROS   Positive for: Cardiovascular, Eyes Negative for: Constitutional, Gastrointestinal, Neurological, Skin, Genitourinary, Musculoskeletal, HENT, Endocrine, Respiratory, Psychiatric, Allergic/Imm, Heme/Lymph Last edited by Theodore Demark, COA on 08/10/2021  7:58 AM.     ALLERGIES Allergies  Allergen Reactions   Shellfish Allergy Anaphylaxis    Just to shrimp   PAST MEDICAL HISTORY Past Medical History:  Diagnosis Date   Back pain    CAD (coronary artery disease)    a. 09/2018 Inf STEMI/PCI: LM nl, LAD min irregs, D1/2 min irregs, LCX 100ost/p thrombotic (2.75x35 Orsiro DES), RCA 40d, RPAV 90.   Carpal tunnel syndrome    DDD (degenerative disc disease)    Diastolic dysfunction    a. 09/2018 Echo: EF 50-55%, prob inflat and antlat HK, Gr1 DD. Mildlly dil Ao root (24m). Nl RV fxn.   Hyperlipemia    Hypertensive retinopathy    OU   Mildly Dilated aortic root ([place])    a. 09/2018 Echo: 76mm.   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 Peterson [facility]   COLONOSCOPY     CORONARY ANGIOGRAPHY N/A 10/08/2018   Procedure: CORONARY ANGIOGRAPHY;  Surgeon: Sherren Mocha, MD;  Location: [place] [facility];  Service: Cardiovascular;  Laterality: N/A;   CORONARY STENT INTERVENTION N/A 10/08/2018   Procedure: CORONARY STENT INTERVENTION;  Surgeon: Sherren Mocha, MD;  Location: [facility];  Service: Cardiovascular;  Laterality:  N/A;   CORONARY/GRAFT ACUTE MI REVASCULARIZATION N/A 10/08/2018   Procedure: Coronary/Graft Acute MI Revascularization;  Surgeon: Sherren Mocha, MD;  Location: [facility];  Service: Cardiovascular;  Laterality: N/A;   EYE SURGERY Bilateral 2019   Cat Sx - Dr. [name]   KNEE ARTHROSCOPY     rightx2   RIB RESECTION  2003   thorasic outlet syndrome-rt   SHOULDER ARTHROSCOPY     rightx2   TONSILLECTOMY     FAMILY HISTORY History reviewed. No pertinent family history.  SOCIAL HISTORY Social History   Tobacco Use   Smoking status: Never   Smokeless tobacco: Never  Vaping Use   Vaping Use: Never used  Substance Use Topics   Alcohol use: Yes    Comment: occ   Drug use: No       OPHTHALMIC EXAM:  Base Eye Exam     Visual Acuity (Snellen - Linear)       Right Left   Dist [place] 20/30 20/20   Dist ph [place] 20/25 -2          Tonometry (Tonopen, 7:54 AM)       Right Left   Pressure 21 14         Pupils       Dark Light Shape React APD   Right 4 3 Round Brisk None   Left 3 2 Round Brisk None         Visual Fields (Counting fingers)       Left Right    Full Full         Extraocular Movement       Right Left    Full Full         Neuro/Psych     Oriented x3: Yes   Mood/Affect: Normal         Dilation     Both eyes: 1.0% Mydriacyl, 2.5% Phenylephrine @ 7:53 AM           Slit Lamp and Fundus Exam     Slit Lamp Exam       Right Left   Lids/Lashes Dermatochalasis - upper lid Dermatochalasis - upper lid   Conjunctiva/Sclera White and quiet White and quiet   Cornea Mild arcus, well healed temporal cataract wounds, trace endo pigment Mild arcus, trace Debris in tear film, well healed temporal cataract wounds, trace endo pigment   Anterior Chamber Deep, 0.5+pigment Deep and quiet   Iris Round and dilated Round and dilated   Lens MF PC IOL in perfect position with open PC MF PC IOL in perfect position with open PC   Vitreous  Vitreous syneresis, Posterior vitreous detachment, vitreous condensations -- prominent linear white condensation overlying IT macula Vitreous syneresis, Posterior vitreous detachment, vitreous condensations - very mobile, prominent floater IT         Fundus Exam  Right Left   Disc Pink and Sharp, Compact Pink and Sharp   C/D Ratio 0.4 0.5   Macula Flat, good foveal reflex, interval improvement in Cystic changes temporal fovea, mild RPE mottling and clumping Flat, good foveal reflex, RPE mottling and clumping, No heme or edema   Vessels mild attenuation, mild tortuousity mild attenuation, mild tortuousity   Periphery Attached, No heme, No RT/RD Attached, No heme, No RT/RD            IMAGING AND PROCEDURES  Imaging and Procedures for 08/10/2021  OCT, Retina - OU - Both Eyes       Right Eye Quality was good. Central Foveal Thickness: 345. Progression has improved. Findings include no SRF, vitreomacular adhesion , no IRF, normal foveal contour (Interval resolution of central CME / IRF).   Left Eye Quality was good. Central Foveal Thickness: 337. Progression has been stable. Findings include normal foveal contour, no IRF, no SRF, vitreomacular adhesion .   Notes *Images captured and stored on drive  Diagnosis / Impression:  OD: Interval resolution of central CME / IRF OS: NFP, no IRF/SRF; tr cystic changes  Clinical management:  See below  Abbreviations: NFP - Normal foveal profile. CME - cystoid macular edema. PED - pigment epithelial detachment. IRF - intraretinal fluid. SRF - subretinal fluid. EZ - ellipsoid zone. ERM - epiretinal membrane. ORA - outer retinal atrophy. ORT - outer retinal tubulation. SRHM - subretinal hyper-reflective material. IRHM - intraretinal hyper-reflective material             ASSESSMENT/PLAN:    ICD-10-CM   1. Cystoid macular edema of right eye  H35.351     2. Retinal edema  H35.81 OCT, Retina - OU - Both Eyes    3. Vitreous  syneresis of both eyes  H43.393     4. Vitreous floaters of both eyes  H43.393     5. Essential hypertension  I10     6. Hypertensive retinopathy of both eyes  H35.033     7. Pseudophakia, both eyes  Z96.1       1,2. CME OD  - initially diagnosed w/ CME OU by Dr. [name] on 08.16.21 and started on Durezol and BromSite TID OU  - s/p STK OD #1, 09.30.22  - today BCVA 20/25 OD -- stable  - IOP 21,14 -- history of steroid response but okay today  - OCT shows OD: interval resolution of central CME / IRF; OS: tr cystic changes  - currently on Lotemax TID OD, Prolensa TID OD, Dorz BID OU -- cont  - history of IOP steroid response -- on Dorzolamide BID OU -- continue   - f/u for CME recheck in 8 wks  3,4. Vitreous syneresis / floaters OU  - prominent symptomatic floaters OU  - s/p YAG vitreolysis OD 3.4.2020 ([facility])  - discussed possibility of PPV for visually significant vit opacities  - pt wishes to proceed with surgery to clear vitreous opacities  - recommend 25g PPV OS for vitreous opacities  - f/u week of Nov. 7 or 8, DFE, OCT, surgical paperwork  5,6. Hypertensive retinopathy OU - discussed importance of tight BP control - monitor   7. Pseudophakia OU  - s/p CE/IOL OU 2019  - multifocal IOLs in perfect position, doing well  - monitor     Ophthalmic Meds Ordered this visit:  No orders of the defined types were placed in this encounter.    Return in about 8 weeks (around 10/05/2021) for CME OD --  DFE/OCT; +week of Nov 7 for surgery paperwork.  There are no Patient Instructions on file for this visit.   Explained the diagnoses, plan, and follow up with the patient and they expressed understanding.  Patient expressed understanding of the importance of proper follow up care.  This document serves as a record of services personally performed by Gardiner Sleeper, MD, PhD. It was created on their behalf by Leonie Douglas, an ophthalmic technician. The creation of this  record is the provider's dictation and/or activities during the visit.    Electronically signed by: Leonie Douglas COA, 08/12/21  2:18 AM  This document serves as a record of services personally performed by Gardiner Sleeper, MD, PhD. It was created on their behalf by San Jetty. Owens Shark, OA an ophthalmic technician. The creation of this record is the provider's dictation and/or activities during the visit.    Electronically signed by: San Jetty. Owens Shark, [place] 10.28.2022 2:18 AM   Gardiner Sleeper, M.D., Ph.D. Diseases & Surgery of the Retina and [place] 08/10/2021  I have reviewed the above documentation for accuracy and completeness, and I agree with the above. Gardiner Sleeper, M.D., Ph.D. 08/12/21 2:18 AM   Abbreviations: M myopia (nearsighted); A astigmatism; H hyperopia (farsighted); P presbyopia; Mrx spectacle prescription;  CTL contact lenses; OD right eye; OS left eye; OU both eyes  XT exotropia; ET esotropia; PEK punctate epithelial keratitis; PEE punctate epithelial erosions; DES dry eye syndrome; MGD meibomian gland dysfunction; ATs artificial tears; PFAT's preservative free artificial tears; [place] nuclear sclerotic cataract; PSC posterior subcapsular cataract; ERM epi-retinal membrane; PVD posterior vitreous detachment; RD retinal detachment; DM diabetes mellitus; DR diabetic retinopathy; NPDR non-proliferative diabetic retinopathy; PDR proliferative diabetic retinopathy; CSME clinically significant macular edema; DME diabetic macular edema; dbh dot blot hemorrhages; CWS cotton wool spot; POAG primary open angle glaucoma; C/D cup-to-disc ratio; HVF humphrey visual field; GVF goldmann visual field; OCT optical coherence tomography; IOP intraocular pressure; BRVO Branch retinal vein occlusion; CRVO central retinal vein occlusion; CRAO central retinal artery occlusion; BRAO branch retinal artery occlusion; RT retinal tear; SB scleral buckle; PPV pars plana vitrectomy;  VH Vitreous hemorrhage; PRP panretinal laser photocoagulation; IVK intravitreal kenalog; VMT vitreomacular traction; MH Macular hole;  NVD neovascularization of the disc; NVE neovascularization elsewhere; AREDS age related eye disease study; ARMD age related macular degeneration; POAG primary open angle glaucoma; EBMD epithelial/anterior basement membrane dystrophy; ACIOL anterior chamber intraocular lens; IOL intraocular lens; PCIOL posterior chamber intraocular lens; Phaco/IOL phacoemulsification with intraocular lens placement; [place] photorefractive keratectomy; LASIK laser assisted in situ keratomileusis; HTN hypertension; DM diabetes mellitus; COPD chronic obstructive pulmonary disease

## 2021-08-10 ENCOUNTER — Other Ambulatory Visit: Payer: Self-pay

## 2021-08-10 ENCOUNTER — Ambulatory Visit (INDEPENDENT_AMBULATORY_CARE_PROVIDER_SITE_OTHER): Payer: 59 | Admitting: Ophthalmology

## 2021-08-10 ENCOUNTER — Encounter (INDEPENDENT_AMBULATORY_CARE_PROVIDER_SITE_OTHER): Payer: Self-pay | Admitting: Ophthalmology

## 2021-08-10 DIAGNOSIS — H43393 Other vitreous opacities, bilateral: Secondary | ICD-10-CM

## 2021-08-10 DIAGNOSIS — H35351 Cystoid macular degeneration, right eye: Secondary | ICD-10-CM

## 2021-08-10 DIAGNOSIS — H3581 Retinal edema: Secondary | ICD-10-CM

## 2021-08-10 DIAGNOSIS — Z961 Presence of intraocular lens: Secondary | ICD-10-CM

## 2021-08-10 DIAGNOSIS — I10 Essential (primary) hypertension: Secondary | ICD-10-CM

## 2021-08-10 DIAGNOSIS — H35033 Hypertensive retinopathy, bilateral: Secondary | ICD-10-CM | POA: Diagnosis not present

## 2021-08-12 ENCOUNTER — Encounter (INDEPENDENT_AMBULATORY_CARE_PROVIDER_SITE_OTHER): Payer: Self-pay | Admitting: Ophthalmology

## 2021-08-14 ENCOUNTER — Other Ambulatory Visit: Payer: Self-pay | Admitting: Home Modifications

## 2021-08-14 DIAGNOSIS — N492 Inflammatory disorders of scrotum: Secondary | ICD-10-CM

## 2021-08-16 NOTE — Progress Notes (Addendum)
[facility] Note  08/20/2021     CHIEF COMPLAINT Patient presents for Retina Follow Up  HISTORY OF PRESENT ILLNESS: Andrew Mitchell is a 60 y.o. male who presents to the clinic today for:  HPI     Retina Follow Up   Patient presents with  Other.  In both eyes.  This started months ago.  Severity is mild.  Duration of 10 days.  Since onset it is stable.  I, the attending physician,  performed the HPI with the patient and updated documentation appropriately.        Comments   60 y/o male pt here for 10 day f/u for vitreous synerisis/floaters OU.  Pt here for surgical planning for PPV OS.  No change in [place] OU.  Denies pain, FOL, new floaters.  Has a stye on his RLL.  Lotemax TID OD Prolensa TID OD Dorzolamide BID OU      Last edited by [name], MD on 08/20/2021  8:03 AM.      Referring physician: [name], MD [place],  [place] [id number]  HISTORICAL INFORMATION:  Selected notes from the MEDICAL RECORD NUMBER Referred by Dr. [name] for eval of CME   CURRENT MEDICATIONS: Current Outpatient Medications (Ophthalmic Drugs)  Medication Sig   Bromfenac Sodium (PROLENSA) 0.07 % SOLN INSTILL 1 DROP IN RIGHT EYE FOUR TIMES DAILY   dorzolamide (TRUSOPT) 2 % ophthalmic solution 1 drop 2 (two) times daily.   loteprednol (LOTEMAX) 0.5 % ophthalmic suspension Place 1 drop into the right eye 3 (three) times daily.   Difluprednate 0.05 % EMUL Apply to eye. (Patient not taking: Reported on 08/20/2021)   No current facility-administered medications for this visit. (Ophthalmic Drugs)   Current Outpatient Medications (Other)  Medication Sig   buPROPion (WELLBUTRIN XL) 150 MG 24 hr tablet take 1-2 tablets by mouth in the morning   carbidopa-levodopa (SINEMET IR) 25-100 MG per tablet Take 1/2 to 1 tablet by mouth daily before bedtime.   clopidogrel (PLAVIX) 75 MG tablet TAKE 1 TABLET BY MOUTH EVERY DAY   clopidogrel (PLAVIX) 75 MG  tablet Take by mouth.   ezetimibe (ZETIA) 10 MG tablet TAKE 1 TABLET BY MOUTH EVERY DAY   famotidine (PEPCID) 40 MG tablet 1 tablet   metoprolol tartrate (LOPRESSOR) 25 MG tablet TAKE 1 TABLET BY MOUTH TWICE A DAY   montelukast (SINGULAIR) 10 MG tablet 1 tablet   Multiple Vitamin (MULTIVITAMIN) capsule Take by mouth.   Multiple Vitamins-Minerals (CENTRUM PO) Take 1 tablet by mouth daily.   rosuvastatin (CRESTOR) 5 MG tablet TAKE 1 TABLET BY MOUTH EVERY DAY   zolpidem (AMBIEN) 5 MG tablet 1 tablet at bedtime.   nitroGLYCERIN (NITROSTAT) 0.4 MG SL tablet Place 1 tablet (0.4 mg total) under the tongue every 5 (five) minutes x 3 doses as needed for chest pain.   No current facility-administered medications for this visit. (Other)   REVIEW OF SYSTEMS: ROS   Positive for: Cardiovascular, Eyes Negative for: Constitutional, Gastrointestinal, Neurological, Skin, Genitourinary, Musculoskeletal, HENT, Endocrine, Respiratory, Psychiatric, Allergic/Imm, Heme/Lymph Last edited by [name], COA on 08/20/2021  7:47 AM.      ALLERGIES Allergies  Allergen Reactions   Shellfish Allergy Anaphylaxis    Just to shrimp   PAST MEDICAL HISTORY Past Medical History:  Diagnosis Date   Back pain    CAD (coronary artery disease)    a. 09/2018 Inf STEMI/PCI: LM nl, LAD min irregs, D1/2 min  irregs, LCX 100ost/p thrombotic (2.75x35 Orsiro DES), RCA 40d, RPAV 90.   Carpal tunnel syndrome    DDD (degenerative disc disease)    Diastolic dysfunction    a. 09/2018 Echo: EF 50-55%, prob inflat and antlat HK, Gr1 DD. Mildlly dil Ao root (34m). Nl RV fxn.   Hyperlipemia    Hypertensive retinopathy    OU   Mildly Dilated aortic root ([place])    a. 09/2018 Echo: 64mm.   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 Peterson [facility]   COLONOSCOPY     CORONARY ANGIOGRAPHY N/A 10/08/2018   Procedure: CORONARY ANGIOGRAPHY;  Surgeon: Sherren Mocha, MD;  Location: [facility];  Service: Cardiovascular;  Laterality: N/A;   CORONARY STENT INTERVENTION N/A 10/08/2018   Procedure: CORONARY STENT INTERVENTION;  Surgeon: Sherren Mocha, MD;  Location: [facility];  Service: Cardiovascular;  Laterality: N/A;   CORONARY/GRAFT ACUTE MI REVASCULARIZATION N/A 10/08/2018   Procedure: Coronary/Graft Acute MI Revascularization;  Surgeon: Sherren Mocha, MD;  Location: [facility];  Service: Cardiovascular;  Laterality: N/A;   EYE SURGERY Bilateral 2019   Cat Sx - Dr. [name]   KNEE ARTHROSCOPY     rightx2   RIB RESECTION  2003   thorasic outlet syndrome-rt   SHOULDER ARTHROSCOPY     rightx2   TONSILLECTOMY     FAMILY HISTORY History reviewed. No pertinent family history.  SOCIAL HISTORY Social History   Tobacco Use   Smoking status: Never   Smokeless tobacco: Never  Vaping Use   Vaping Use: Never used  Substance Use Topics   Alcohol use: Yes    Comment: occ   Drug use: No       OPHTHALMIC EXAM:  Base Eye Exam     Visual Acuity (Snellen - Linear)       Right Left   Dist  20/30 -2 20/20   Dist ph  20/25 -2          Tonometry (Tonopen, 7:52 AM)       Right Left   Pressure 23 17         Pupils       Dark Light Shape React APD   Right 4 3 Round Brisk None   Left 4 3 Round Brisk None         Visual Fields (Counting fingers)       Left Right    Full Full         Extraocular Movement       Right Left    Full, Ortho Full, Ortho         Neuro/Psych     Oriented x3: Yes   Mood/Affect: Normal         Dilation     Left eye: 1.0% Mydriacyl, 2.5% Phenylephrine @ 7:52 AM           Slit Lamp and Fundus Exam      Slit Lamp Exam       Right Left   Lids/Lashes Dermatochalasis - upper lid, healing stye RLL Dermatochalasis - upper lid   Conjunctiva/Sclera White and quiet White and quiet   Cornea Mild arcus, well  healed temporal cataract wounds, trace endo pigment Mild arcus, trace Debris in tear film, well healed temporal cataract wounds, trace endo pigment   Anterior Chamber Deep, 0.5+pigment Deep and quiet   Iris Round and reactive Round and dilated to 7.64mm   Lens MF PC IOL in perfect position with open PC MF PC IOL in perfect position with open PC   Vitreous Vitreous syneresis, Posterior vitreous detachment, vitreous condensations -- prominent linear white condensation overlying IT macula Vitreous syneresis, Posterior vitreous detachment, vitreous condensations - very mobile, prominent floater IT         Fundus Exam       Right Left   Disc  Pink and Sharp   C/D Ratio 0.4 0.5   Macula  Flat, good foveal reflex, RPE mottling and clumping, No heme or edema   Vessels  mild attenuation, mild tortuousity   Periphery  Attached, No heme, No RT/RD            IMAGING AND PROCEDURES  Imaging and Procedures for 08/20/2021  OCT, Retina - OU - Both Eyes       Right Eye Quality was good. Central Foveal Thickness: 344. Progression has been stable. Findings include no SRF, vitreomacular adhesion , no IRF, normal foveal contour (Stable resolution of central CME / IRF -- tr cystic changes temp fovea).   Left Eye Quality was good. Central Foveal Thickness: 335. Progression has been stable. Findings include normal foveal contour, no IRF, no SRF, vitreomacular adhesion .   Notes *Images captured and stored on drive  Diagnosis / Impression:  OD: Stable resolution of central CME / IRF -- tr cystic changes temp fovea OS: NFP, no IRF/SRF  Clinical management:  See below  Abbreviations: NFP - Normal foveal profile. CME - cystoid macular edema. PED - pigment epithelial detachment. IRF - intraretinal  fluid. SRF - subretinal fluid. EZ - ellipsoid zone. ERM - epiretinal membrane. ORA - outer retinal atrophy. ORT - outer retinal tubulation. SRHM - subretinal hyper-reflective material. IRHM - intraretinal hyper-reflective material              ASSESSMENT/PLAN:    ICD-10-CM   1. Cystoid macular edema of right eye  H35.351     2. Retinal edema  H35.81 OCT, Retina - OU - Both Eyes    3. Vitreous syneresis of both eyes  H43.393     4. Vitreous floaters of both eyes  H43.393     5. Essential hypertension  I10     6. Hypertensive retinopathy of both eyes  H35.033     7. Pseudophakia, both eyes  Z96.1      1,2. CME OD  - initially diagnosed w/ CME OU by Dr. [name] on 08.16.21 and started on Durezol and BromSite TID OU  - s/p STK OD #1, 09.30.22  - today BCVA 20/25 OD -- stable  - IOP 23,17 -- history of steroid response but okay today  - OCT shows OD: stable resolution of central CME / IRF--tr cystic changes temp fovea; OS: stable improvement in IRF/CME  - currently on Lotemax TID OD, Prolensa TID OD, Dorz BID OU -- cont  - history of IOP steroid response -- on Dorzolamide BID OU -- continue   - f/u for CME recheck in 8 wks  3,4. Vitreous syneresis / floaters OU  - prominent symptomatic floaters OU  - s/p YAG vitreolysis OD 3.4.2020 ([facility])  - discussed possibility of PPV for visually significant vit opacities  - pt wishes to  proceed with surgery to clear vitreous opacities - RBA of procedure discussed, questions answered - informed consent obtained and signed  - 25g PPV OS for vitreous opacities scheduled for 11.17.22  - f/u 11.18.22 for POV  5,6. Hypertensive retinopathy OU - discussed importance of tight BP control - monitor   7. Pseudophakia OU  - s/p CE/IOL OU 2019  - multifocal IOLs in perfect position, doing well  - monitor     Ophthalmic Meds Ordered this visit:  No orders of the defined types were placed in this encounter.    Return in about  11 days (around 08/31/2021) for POV -- s/p 25g PPV OS for vit opacities.  There are no Patient Instructions on file for this visit.  This document serves as a record of services personally performed by Gardiner Sleeper, MD, PhD. It was created on their behalf by Leeann Must, [place], an ophthalmic technician. The creation of this record is the provider's dictation and/or activities during the visit.    Electronically signed by: Leeann Must, COA @TODAY @ 8:21 AM   Gardiner Sleeper, M.D., Ph.D. Diseases & Surgery of the Retina and [place] 08/20/2021   I have reviewed the above documentation for accuracy and completeness, and I agree with the above. Gardiner Sleeper, M.D., Ph.D. 08/20/21 8:21 AM    Abbreviations: M myopia (nearsighted); A astigmatism; H hyperopia (farsighted); P presbyopia; Mrx spectacle prescription;  CTL contact lenses; OD right eye; OS left eye; OU both eyes  XT exotropia; ET esotropia; PEK punctate epithelial keratitis; PEE punctate epithelial erosions; DES dry eye syndrome; MGD meibomian gland dysfunction; ATs artificial tears; PFAT's preservative free artificial tears; [place] nuclear sclerotic cataract; PSC posterior subcapsular cataract; ERM epi-retinal membrane; PVD posterior vitreous detachment; RD retinal detachment; DM diabetes mellitus; DR diabetic retinopathy; NPDR non-proliferative diabetic retinopathy; PDR proliferative diabetic retinopathy; CSME clinically significant macular edema; DME diabetic macular edema; dbh dot blot hemorrhages; CWS cotton wool spot; POAG primary open angle glaucoma; C/D cup-to-disc ratio; HVF humphrey visual field; GVF goldmann visual field; OCT optical coherence tomography; IOP intraocular pressure; BRVO Branch retinal vein occlusion; CRVO central retinal vein occlusion; CRAO central retinal artery occlusion; BRAO branch retinal artery occlusion; RT retinal tear; SB scleral buckle; PPV pars plana vitrectomy; VH  Vitreous hemorrhage; PRP panretinal laser photocoagulation; IVK intravitreal kenalog; VMT vitreomacular traction; MH Macular hole;  NVD neovascularization of the disc; NVE neovascularization elsewhere; AREDS age related eye disease study; ARMD age related macular degeneration; POAG primary open angle glaucoma; EBMD epithelial/anterior basement membrane dystrophy; ACIOL anterior chamber intraocular lens; IOL intraocular lens; PCIOL posterior chamber intraocular lens; Phaco/IOL phacoemulsification with intraocular lens placement; [place] photorefractive keratectomy; LASIK laser assisted in situ keratomileusis; HTN hypertension; DM diabetes mellitus; COPD chronic obstructive pulmonary disease

## 2021-08-20 ENCOUNTER — Encounter (INDEPENDENT_AMBULATORY_CARE_PROVIDER_SITE_OTHER): Payer: Self-pay | Admitting: Ophthalmology

## 2021-08-20 ENCOUNTER — Other Ambulatory Visit: Payer: Self-pay

## 2021-08-20 ENCOUNTER — Ambulatory Visit (INDEPENDENT_AMBULATORY_CARE_PROVIDER_SITE_OTHER): Payer: 59 | Admitting: Ophthalmology

## 2021-08-20 DIAGNOSIS — I10 Essential (primary) hypertension: Secondary | ICD-10-CM | POA: Diagnosis not present

## 2021-08-20 DIAGNOSIS — H35351 Cystoid macular degeneration, right eye: Secondary | ICD-10-CM

## 2021-08-20 DIAGNOSIS — H43393 Other vitreous opacities, bilateral: Secondary | ICD-10-CM | POA: Diagnosis not present

## 2021-08-20 DIAGNOSIS — Z961 Presence of intraocular lens: Secondary | ICD-10-CM

## 2021-08-20 DIAGNOSIS — H3581 Retinal edema: Secondary | ICD-10-CM

## 2021-08-20 DIAGNOSIS — H35033 Hypertensive retinopathy, bilateral: Secondary | ICD-10-CM

## 2021-08-22 ENCOUNTER — Telehealth: Payer: Self-pay | Admitting: *Deleted

## 2021-08-22 NOTE — Telephone Encounter (Signed)
   Pre-operative Risk Assessment    Patient Name: Andrew Mitchell  DOB: 1961/08/13 MRN: [id number]      Request for Surgical Clearance   Procedure:   OPHTHALMIC RETINAL  SURGERY  Date of Surgery: Clearance 08/30/21                                 Surgeon:  DR. [name] Surgeon's Group or Practice Name:  [facility] Phone number:  [phone] Fax number:  [phone]   Type of Clearance Requested: - Medical  - Pharmacy:  Hold Clopidogrel (Plavix)     Type of Anesthesia:   General    Additional requests/questions:   Jiles Prows   08/22/2021, 1:33 PM

## 2021-08-22 NOTE — Telephone Encounter (Signed)
Left message to call back and ask to speak with pre-op team.  Of note, Dr. Tamala Julian previously stated patient could hold Plavix for 5 days prior to a colonoscopy in 12/2019. It does not look like patient has had any new cardiac events since that time so the same recommendations should be able to be used.   Darreld Mclean, PA-C 08/22/2021 3:09 PM

## 2021-08-27 ENCOUNTER — Encounter (INDEPENDENT_AMBULATORY_CARE_PROVIDER_SITE_OTHER): Payer: Self-pay | Admitting: Ophthalmology

## 2021-08-27 ENCOUNTER — Telehealth: Payer: Self-pay | Admitting: Interventional Cardiology

## 2021-08-27 NOTE — Telephone Encounter (Signed)
   Name: Andrew Mitchell  DOB: 03-Feb-1961  MRN: [id number]   Primary Cardiologist: Sinclair Grooms, MD  Chart reviewed as part of pre-operative protocol coverage. Patient was contacted 08/27/2021 in reference to pre-operative risk assessment for pending surgery as outlined below.  Andrew Mitchell was last seen 10/2020 by Dr. Tamala Julian.   Sande Rives reached out to patient 11/9 but we had not gotten a call back from patient yet until today.  I reached out to patient for update on how he is doing. The patient affirms he has been doing well without any new cardiac symptoms. He is able to walk regularly exerting over 4 METS without angina or dyspnea. Therefore, based on ACC/AHA guidelines, the patient would be at acceptable risk for the planned procedure without further cardiovascular testing. The patient was advised that if he develops new symptoms prior to surgery to contact our office to arrange for a follow-up visit, and he verbalized understanding.  In an unrelated clearance 12/2019, Dr. Tamala Julian previously stated that patient could hold Plavix for 5 days prior to a procedure - given no interim clinical changes since that time, the same recommendation can be used. However, as we had to been able to reach patient until today, he has not yet begun holding his Plavix (clopidogrel). Procedure is only 3 days away. I will route to callback staff to notify surgeon by phone so they are aware as they may need to push back procedure date to allow adequate holding of antiplatelet if needed.  I will also route this recommendation to the requesting party via Epic fax function and remove from pre-op pool. Please call with questions.  Charlie Pitter, PA-C 08/27/2021, 10:19 AM

## 2021-08-27 NOTE — Telephone Encounter (Signed)
Patient was returning call 

## 2021-08-27 NOTE — Telephone Encounter (Signed)
Duplicate note, addressed in other phone note already.

## 2021-08-27 NOTE — Telephone Encounter (Addendum)
I s/w Dr. Dairl Ponder team and advised that the pt has not held Plavix x 5 days prior to procedure, as we were not able to reach the pt until today. Please see notes from Melina Copa, [place]. I was placed on a brief hold and nurse came back stating Dr. Coralyn Pear said ok as long as the pt is holding Plavix as of today. I will fax notes to Dr. Coralyn Pear and his team.

## 2021-08-28 NOTE — Progress Notes (Signed)
[facility] Note  08/31/2021     CHIEF COMPLAINT Patient presents for Retina Follow Up and Post-op Follow-up  HISTORY OF PRESENT ILLNESS: Andrew Mitchell is a 60 y.o. male who presents to the clinic today for:  HPI     Retina Follow Up   Patient presents with  Other.  In left eye.  Severity is moderate.  Duration of 1 day.  Since onset it is stable.  I, the attending physician,  performed the HPI with the patient and updated documentation appropriately.        Post-op Follow-up   I, the attending physician,  performed the HPI with the patient and updated documentation appropriately.        Comments   Pt here for 1 day PO visit s/p PPV OS. Pt states hes doing well this morning, a slight headache and some irritation in OS. He does not the floater hes had in OD is more apparent. No gtts taken since the hopsital yesterday.       Last edited by [name], MD on 09/01/2021  1:32 AM.    Pt states last night went well, he had some irritation last night, but feels okay this morning, he states he notices more floaters in OD now   Referring physician: [name], MD [place],  [place] [id number]  HISTORICAL INFORMATION:  Selected notes from the [facility] Referred by Dr. [name] for eval of CME   CURRENT MEDICATIONS: Current Outpatient Medications (Ophthalmic Drugs)  Medication Sig   Bromfenac Sodium (PROLENSA) 0.07 % SOLN INSTILL 1 DROP IN RIGHT EYE FOUR TIMES DAILY (Patient taking differently: Place 1 drop into the right eye 3 (three) times daily.)   dorzolamide (TRUSOPT) 2 % ophthalmic solution Place into both eyes 2 (two) times daily.   loteprednol (LOTEMAX) 0.5 % ophthalmic suspension Place 1 drop into the right eye 3 (three) times daily. (Patient taking differently: Place 1 drop into the right eye 2 (two) times daily.)   No current facility-administered medications for this visit. (Ophthalmic Drugs)    Current Outpatient Medications (Other)  Medication Sig   carbidopa-levodopa (SINEMET IR) 25-100 MG per tablet Take 1/2 to 1 tablet by mouth daily before bedtime.   clopidogrel (PLAVIX) 75 MG tablet TAKE 1 TABLET BY MOUTH EVERY DAY   ezetimibe (ZETIA) 10 MG tablet TAKE 1 TABLET BY MOUTH EVERY DAY   famotidine (PEPCID) 40 MG tablet Take 40 mg by mouth daily.   metoprolol tartrate (LOPRESSOR) 25 MG tablet TAKE 1 TABLET BY MOUTH TWICE A DAY   Multiple Vitamin (MULTIVITAMIN) capsule Take 1 capsule by mouth daily.   Multiple Vitamins-Minerals (CENTRUM PO) Take 1 tablet by mouth daily.   nitroGLYCERIN (NITROSTAT) 0.4 MG SL tablet Place 1 tablet (0.4 mg total) under the tongue every 5 (five) minutes x 3 doses as needed for chest pain.   rosuvastatin (CRESTOR) 5 MG tablet TAKE 1 TABLET BY MOUTH EVERY DAY   zolpidem (AMBIEN) 5 MG tablet Take 10 mg by mouth at bedtime as needed for sleep.   No current facility-administered medications for this visit. (Other)   REVIEW OF SYSTEMS: ROS   Positive for: Cardiovascular, Eyes Negative for: Constitutional, Gastrointestinal, Neurological, Skin, Genitourinary, Musculoskeletal, HENT, Endocrine, Respiratory, Psychiatric, Allergic/Imm, Heme/Lymph Last edited by [name], COT on 08/31/2021  8:04 AM.     ALLERGIES Allergies  Allergen Reactions   Shellfish Allergy Anaphylaxis    Just to shrimp  PAST MEDICAL HISTORY Past Medical History:  Diagnosis Date   Back pain    CAD (coronary artery disease)    a. 09/2018 Inf STEMI/PCI: LM nl, LAD min irregs, D1/2 min irregs, LCX 100ost/p thrombotic (2.75x35 Orsiro DES), RCA 40d, RPAV 90.   Carpal tunnel syndrome    DDD (degenerative disc disease)    Diastolic dysfunction    a. 09/2018 Echo: EF 50-55%, prob inflat and antlat HK, Gr1 DD. Mildlly dil Ao root (98m). Nl RV fxn.   Hyperlipemia    Hypertension    Hypertensive retinopathy    OU   Mildly Dilated aortic root ([place])    a. 09/2018 Echo:  80mm.   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 Peterson [facility]   COLONOSCOPY     CORONARY ANGIOGRAPHY N/A 10/08/2018   Procedure: CORONARY ANGIOGRAPHY;  Surgeon: Sherren Mocha, MD;  Location: [place] [facility];  Service: Cardiovascular;  Laterality: N/A;   CORONARY STENT INTERVENTION N/A 10/08/2018   Procedure: CORONARY STENT INTERVENTION;  Surgeon: Sherren Mocha, MD;  Location: [facility];  Service: Cardiovascular;  Laterality: N/A;   CORONARY/GRAFT ACUTE MI REVASCULARIZATION N/A 10/08/2018   Procedure: Coronary/Graft Acute MI Revascularization;  Surgeon: Sherren Mocha, MD;  Location: [place] [facility];  Service: Cardiovascular;  Laterality: N/A;   EYE SURGERY Bilateral 2019   Cat Sx - Dr. [name]   KNEE ARTHROSCOPY     rightx2   PARS PLANA VITRECTOMY Left 08/30/2021   Procedure: PARS PLANA VITRECTOMY WITH 25 GAUGE;  Surgeon: [name], MD;  Location: [facility];  Service: Ophthalmology;  Laterality: Left;   PHOTOCOAGULATION WITH LASER Left 08/30/2021   Procedure: PHOTOCOAGULATION WITH LASER;  Surgeon: [name], MD;  Location: [facility];  Service: Ophthalmology;  Laterality: Left;   RIB RESECTION  2003   thorasic outlet syndrome-rt   SHOULDER ARTHROSCOPY     rightx2   TONSILLECTOMY     FAMILY HISTORY History reviewed. No pertinent family history.  SOCIAL HISTORY Social History   Tobacco Use   Smoking status: Never   Smokeless tobacco: Never  Vaping Use   Vaping Use: Never used  Substance Use Topics   Alcohol use: Yes    Comment: occ   Drug use: No       OPHTHALMIC EXAM:  Base Eye Exam     Visual Acuity (Snellen - Linear)        Right Left   Dist [place]  20/80 -2   Dist ph [place]  20/50 +2         Tonometry (Tonopen, 8:10 AM)       Right Left   Pressure def 11         Pupils       Dark   Right def   Left Dilated         Visual Fields (Counting fingers)       Left Right    Full Full         Extraocular Movement       Right Left    Full, Ortho Full, Ortho         Neuro/Psych     Oriented x3: Yes  Mood/Affect: Normal         Dilation     Left eye: 1.0% Mydriacyl, 2.5% Phenylephrine @ 8:11 AM           Slit Lamp and Fundus Exam     Slit Lamp Exam       Right Left   Lids/Lashes Dermatochalasis - upper lid, healing stye RLL Dermatochalasis - upper lid   Conjunctiva/Sclera White and quiet Subconjunctival hemorrhage, Injection, sutures intact   Cornea Mild arcus, well healed temporal cataract wounds, trace endo pigment well healed temporal cataract wounds, trace PEE, trace Descemet's folds   Anterior Chamber Deep, 0.5+pigment 2-3+ cell/pigmet   Iris Round and reactive Round and dilated to 7.36mm   Lens MF PC IOL in perfect position with open PC MF PC IOL in perfect position with open PC   Anterior Vitreous Vitreous syneresis, Posterior vitreous detachment, vitreous condensations -- prominent linear white condensation overlying IT macula post vitrectomy; clear w/ interval improvement in vitreous opacities         Fundus Exam       Right Left   Disc  Pink and Sharp   C/D Ratio 0.4 0.5   Macula  Flat, good foveal reflex, mild RPE mottling and clumping, No heme or edema   Vessels  mild attenuation, mild tortuousity   Periphery  Attached, No heme, No RT/RD, good 360 early laser changes            IMAGING AND PROCEDURES  Imaging and Procedures for 08/31/2021            ASSESSMENT/PLAN:    ICD-10-CM   1. Cystoid macular edema of right eye  H35.351     2. Retinal edema  H35.81     3. Vitreous syneresis of both eyes  H43.393     4. Vitreous floaters of both  eyes  H43.393     5. Essential hypertension  I10     6. Hypertensive retinopathy of both eyes  H35.033     7. Pseudophakia, both eyes  Z96.1      1,2. CME OD  - initially diagnosed w/ CME OU by Dr. [name] on 08.16.21 and started on Durezol and BromSite TID OU  - s/p STK OD #1, 09.30.22  - today BCVA 20/25 OD -- stable  - IOP 23,17 -- history of steroid response but okay today  - OCT shows OD: stable resolution of central CME / IRF--tr cystic changes temp fovea; OS: stable improvement in IRF/CME  - currently on Lotemax TID OD, Prolensa TID OD, Dorz BID OU -- cont  - history of IOP steroid response -- on Dorzolamide BID OU -- continue   - f/u for CME recheck in 8 wks  3,4. Vitreous syneresis / floaters OU  - prominent symptomatic floaters OU  - s/p YAG vitreolysis OD 3.4.2020 ([facility])  - POD1 s/p PPV/EL OS, 11.17.22             - doing well this morning             - IOP good at 11             - start   PF 6x/day OS                          zymaxid QID OS  PSO ung QID OS              - eye shield when sleeping              - post op drop and positioning instructions reviewed              - tylenol/ibuprofen for pain   - f/u [facility], Nov. 23 -- POV w/ DFE, OCT  5,6. Hypertensive retinopathy OU - discussed importance of tight BP control - monitor    7. Pseudophakia OU  - s/p CE/IOL OU 2019  - multifocal IOLs in perfect position, doing well  - monitor    Ophthalmic Meds Ordered this visit:  No orders of the defined types were placed in this encounter.    Return in about 5 days (around 09/05/2021) for f/u vitreous syneresis / floaters OS, POV, OCT.  There are no Patient Instructions on file for this visit.  This document serves as a record of services personally performed by Gardiner Sleeper, MD, PhD. It was created on their behalf by Leonie Douglas, an ophthalmic technician. The creation of this record is the provider's dictation and/or  activities during the visit.    Electronically signed by: Leonie Douglas COA, 09/01/21  1:33 AM  This document serves as a record of services personally performed by Gardiner Sleeper, MD, PhD. It was created on their behalf by San Jetty. Owens Shark, OA an ophthalmic technician. The creation of this record is the provider's dictation and/or activities during the visit.    Electronically signed by: San Jetty. Owens Shark, [place] 11.18.2022 1:33 AM  Gardiner Sleeper, M.D., Ph.D. Diseases & Surgery of the Retina and [place] 08/31/2021   I have reviewed the above documentation for accuracy and completeness, and I agree with the above. Gardiner Sleeper, M.D., Ph.D. 09/01/21 1:37 AM   Abbreviations: M myopia (nearsighted); A astigmatism; H hyperopia (farsighted); P presbyopia; Mrx spectacle prescription;  CTL contact lenses; OD right eye; OS left eye; OU both eyes  XT exotropia; ET esotropia; PEK punctate epithelial keratitis; PEE punctate epithelial erosions; DES dry eye syndrome; MGD meibomian gland dysfunction; ATs artificial tears; PFAT's preservative free artificial tears; [place] nuclear sclerotic cataract; PSC posterior subcapsular cataract; ERM epi-retinal membrane; PVD posterior vitreous detachment; RD retinal detachment; DM diabetes mellitus; DR diabetic retinopathy; NPDR non-proliferative diabetic retinopathy; PDR proliferative diabetic retinopathy; CSME clinically significant macular edema; DME diabetic macular edema; dbh dot blot hemorrhages; CWS cotton wool spot; POAG primary open angle glaucoma; C/D cup-to-disc ratio; HVF humphrey visual field; GVF goldmann visual field; OCT optical coherence tomography; IOP intraocular pressure; BRVO Branch retinal vein occlusion; CRVO central retinal vein occlusion; CRAO central retinal artery occlusion; BRAO branch retinal artery occlusion; RT retinal tear; SB scleral buckle; PPV pars plana vitrectomy; VH Vitreous hemorrhage; PRP panretinal  laser photocoagulation; IVK intravitreal kenalog; VMT vitreomacular traction; MH Macular hole;  NVD neovascularization of the disc; NVE neovascularization elsewhere; AREDS age related eye disease study; ARMD age related macular degeneration; POAG primary open angle glaucoma; EBMD epithelial/anterior basement membrane dystrophy; ACIOL anterior chamber intraocular lens; IOL intraocular lens; PCIOL posterior chamber intraocular lens; Phaco/IOL phacoemulsification with intraocular lens placement; [place] photorefractive keratectomy; LASIK laser assisted in situ keratomileusis; HTN hypertension; DM diabetes mellitus; COPD chronic obstructive pulmonary disease

## 2021-08-28 NOTE — H&P (Signed)
Andrew Mitchell is an 60 y.o. male.    Chief Complaint: visually significant vitreous opacities, LEFT EYE  HPI: Pt with chronic, recurrent CME OD and prominent symptomatic floaters OU, s/p YAG vitreolysis with Dr. [name]. Over the course of 14 mos of retinal care, the patient has demonstrated significant impact of floaters to activities of daily living. After a discussion of the risks, benefits and alternatives to surgery, the patient has elected to proceed with surgery to clear the visually significant vitreous opacities from the left eye -- 25g PPV w/ endolaser OS under general anesthesia.    Past Medical History:  Diagnosis Date   Back pain    CAD (coronary artery disease)    a. 09/2018 Inf STEMI/PCI: LM nl, LAD min irregs, D1/2 min irregs, LCX 100ost/p thrombotic (2.75x35 Orsiro DES), RCA 40d, RPAV 90.   Carpal tunnel syndrome    DDD (degenerative disc disease)    Diastolic dysfunction    a. 09/2018 Echo: EF 50-55%, prob inflat and antlat HK, Gr1 DD. Mildlly dil Ao root (107m). Nl RV fxn.   Hyperlipemia    Hypertensive retinopathy    OU   Mildly Dilated aortic root ([place])    a. 09/2018 Echo: 61mm.   Tubular adenocarcinoma ([place]) 2010   colon    Past Surgical History:  Procedure Laterality Date   CARPAL TUNNEL RELEASE Left 01/06/2013   Procedure: CARPAL TUNNEL RELEASE;  Surgeon: Wynonia Sours, MD;  Location: [facility];  Service: Orthopedics;  Laterality: Left;  ANESTHESIA: IV REGIONAL FAB   CATARACT EXTRACTION Bilateral 2019   Dr. Peterson [facility]   COLONOSCOPY     CORONARY ANGIOGRAPHY N/A 10/08/2018   Procedure: CORONARY ANGIOGRAPHY;  Surgeon: Sherren Mocha, MD;  Location: [facility];  Service: Cardiovascular;  Laterality: N/A;   CORONARY STENT INTERVENTION N/A 10/08/2018   Procedure: CORONARY STENT INTERVENTION;  Surgeon: Sherren Mocha, MD;  Location: [facility];  Service: Cardiovascular;  Laterality: N/A;   CORONARY/GRAFT ACUTE MI  REVASCULARIZATION N/A 10/08/2018   Procedure: Coronary/Graft Acute MI Revascularization;  Surgeon: Sherren Mocha, MD;  Location: [facility];  Service: Cardiovascular;  Laterality: N/A;   EYE SURGERY Bilateral 2019   Cat Sx - Dr. [name]   KNEE ARTHROSCOPY     rightx2   RIB RESECTION  2003   thorasic outlet syndrome-rt   SHOULDER ARTHROSCOPY     rightx2   TONSILLECTOMY      No family history on file. Social History:  reports that he has never smoked. He has never used smokeless tobacco. He reports current alcohol use. He reports that he does not use drugs.  Allergies:  Allergies  Allergen Reactions   Shellfish Allergy Anaphylaxis    Just to shrimp    No medications prior to admission.    Review of systems otherwise negative  There were no vitals taken for this visit.  Physical exam: Mental status: oriented x3. Eyes: See eye exam associated with this date of surgery Ears, Nose, Throat: within normal limits Neck: Within Normal limits General: within normal limits Chest: Within normal limits Breast: deferred Heart: Within normal limits Abdomen: Within normal limits GU: deferred Extremities: within normal limits Skin: within normal limits  Assessment/Plan Visually significant vitreous opacities, LEFT EYE  Plan: To [facility] for 25g PPV w/ endolaser, LEFT EYE, under general anesthesia - case scheduled for Thursday, Nov 17, [place] OR 08  Gardiner Sleeper, M.D., Ph.D. Vitreoretinal Surgeon [facility]

## 2021-08-29 ENCOUNTER — Encounter (HOSPITAL_COMMUNITY): Payer: Self-pay | Admitting: Ophthalmology

## 2021-08-29 ENCOUNTER — Other Ambulatory Visit: Payer: Self-pay

## 2021-08-29 NOTE — Progress Notes (Signed)
PCP - Dr. Garlon Hatchet  Cardiologist - Dr. Linard Millers  EP- Denies  Endocrine-Denies  Pulm-Denies  Chest x-ray - Denies  EKG - 11/06/20 (E)  Stress Test - 06/18/17 (E)  ECHO - 12/10/17 (E)  Cardiac Cath - 10/08/18 (E)  AICD-na PM-na LOOP-na  Dialysis-Denies  Sleep Study - Denies CPAP - Denies  LABS- 08/30/21: CBC, BMP  ASA-Denies Plavix-LD-11/14  ERAS- No  HA1C-Denies  Anesthesia- No  Pt denies having chest pain, sob, or fever during the pre-op phone call. All instructions explained to the pt, with a verbal understanding of the material including: as of today, stop taking all Aspirin (unless instructed by your doctor) and Other Aspirin containing products, Vitamins, Fish oils, and Herbal medications. Also stop all NSAIDS i.e. Advil, Ibuprofen, Motrin, Aleve, Anaprox, Naproxen, BC, Goody Powders, and all Supplements.  Pt also instructed to wear a mask and social distance if he has to go out prior to surgery. The opportunity to ask questions was provided.    Coronavirus Screening  Have you experienced the following symptoms:  Cough yes/no: No Fever (>100.48F)  yes/no: No Runny nose yes/no: No Sore throat yes/no: No Difficulty breathing/shortness of breath  yes/no: No  Have you or a family member traveled in the last 14 days and where? yes/no: No   If the patient indicates "YES" to the above questions, their PAT will be rescheduled to limit the exposure to others and, the surgeon will be notified. THE PATIENT WILL NEED TO BE ASYMPTOMATIC FOR 14 DAYS.   If the patient is not experiencing any of these symptoms, the PAT nurse will instruct them to NOT bring anyone with them to their appointment since they may have these symptoms or traveled as well.   Please remind your patients and families that hospital visitation restrictions are in effect and the importance of the restrictions.

## 2021-08-30 ENCOUNTER — Ambulatory Visit (HOSPITAL_COMMUNITY): Payer: 59 | Admitting: Anesthesiology

## 2021-08-30 ENCOUNTER — Other Ambulatory Visit: Payer: Self-pay

## 2021-08-30 ENCOUNTER — Encounter (HOSPITAL_COMMUNITY)
Admission: RE | Disposition: A | Discharge status: Home / Self Care | Payer: Self-pay | Source: Ambulatory Visit | Attending: Ophthalmology

## 2021-08-30 ENCOUNTER — Encounter (HOSPITAL_COMMUNITY): Payer: Self-pay | Admitting: Ophthalmology

## 2021-08-30 ENCOUNTER — Ambulatory Visit (HOSPITAL_COMMUNITY)
Admission: RE | Admit: 2021-08-30 | Discharge: 2021-08-30 | Disposition: A | Discharge status: Home / Self Care | Payer: 59 | Source: Ambulatory Visit | Attending: Ophthalmology | Admitting: Ophthalmology

## 2021-08-30 DIAGNOSIS — I2510 Atherosclerotic heart disease of native coronary artery without angina pectoris: Secondary | ICD-10-CM | POA: Diagnosis not present

## 2021-08-30 DIAGNOSIS — Z955 Presence of coronary angioplasty implant and graft: Secondary | ICD-10-CM | POA: Insufficient documentation

## 2021-08-30 DIAGNOSIS — H43393 Other vitreous opacities, bilateral: Secondary | ICD-10-CM

## 2021-08-30 DIAGNOSIS — H43392 Other vitreous opacities, left eye: Secondary | ICD-10-CM | POA: Diagnosis present

## 2021-08-30 DIAGNOSIS — I252 Old myocardial infarction: Secondary | ICD-10-CM | POA: Insufficient documentation

## 2021-08-30 DIAGNOSIS — I10 Essential (primary) hypertension: Secondary | ICD-10-CM | POA: Insufficient documentation

## 2021-08-30 HISTORY — PX: PARS PLANA VITRECTOMY: SHX2166

## 2021-08-30 HISTORY — PX: PHOTOCOAGULATION WITH LASER: SHX6027

## 2021-08-30 HISTORY — DX: Essential (primary) hypertension: I10

## 2021-08-30 LAB — BASIC METABOLIC PANEL
Anion gap: 10 (ref 5–15)
BUN: 15 mg/dL (ref 8–23)
CO2: 21 mmol/L — ABNORMAL LOW (ref 22–32)
Calcium: 9 mg/dL (ref 8.9–10.3)
Chloride: 104 mmol/L (ref 98–111)
Creatinine, Ser: 0.86 mg/dL (ref 0.61–1.24)
GFR, Estimated: >60 mL/min (ref >60)
Glucose, Bld: 87 mg/dL (ref 70–99)
Potassium: 3.6 mmol/L (ref 3.5–5.1)
Sodium: 135 mmol/L (ref 135–145)

## 2021-08-30 LAB — CBC
HCT: 45.2 % (ref 39.0–52.0)
Hemoglobin: 15.4 g/dL (ref 13.0–17.0)
MCH: 32.8 pg (ref 26.0–34.0)
MCHC: 34.1 g/dL (ref 30.0–36.0)
MCV: 96.4 fL (ref 80.0–100.0)
Platelets: 208 10*3/uL (ref 150–400)
RBC: 4.69 MIL/uL (ref 4.22–5.81)
RDW: 13 % (ref 11.5–15.5)
WBC: 5.3 10*3/uL (ref 4.0–10.5)
nRBC: 0 % (ref 0.0–0.2)

## 2021-08-30 SURGERY — PARS PLANA VITRECTOMY WITH 25 GAUGE
Anesthesia: General | Site: Eye | Laterality: Left

## 2021-08-30 MED ORDER — DORZOLAMIDE HCL-TIMOLOL MAL 2-0.5 % OP SOLN
OPHTHALMIC | Status: DC | PRN
  Administered 2021-08-30: 1 [drp] via OPHTHALMIC

## 2021-08-30 MED ORDER — CHLORHEXIDINE GLUCONATE 0.12 % MT SOLN
15.0000 mL | Freq: Once | OROMUCOSAL | Status: AC
  Administered 2021-08-30: 13:00:00 15 mL via OROMUCOSAL

## 2021-08-30 MED ORDER — BACITRACIN-POLYMYXIN B 500-10000 UNIT/GM OP OINT
TOPICAL_OINTMENT | OPHTHALMIC | Status: DC | PRN
  Administered 2021-08-30: 1 via OPHTHALMIC

## 2021-08-30 MED ORDER — PROPARACAINE HCL 0.5 % OP SOLN
1.0000 [drp] | OPHTHALMIC | Status: AC | PRN
  Administered 2021-08-30 (×3): 1 [drp] via OPHTHALMIC
  Filled 2021-08-30 (×2): qty 15

## 2021-08-30 MED ORDER — FENTANYL CITRATE (PF) 250 MCG/5ML IJ SOLN
INTRAMUSCULAR | Status: AC
  Filled 2021-08-30: qty 5

## 2021-08-30 MED ORDER — TRIAMCINOLONE ACETONIDE 40 MG/ML IJ SUSP
INTRAMUSCULAR | Status: AC
  Filled 2021-08-30: qty 5

## 2021-08-30 MED ORDER — DORZOLAMIDE HCL-TIMOLOL MAL 2-0.5 % OP SOLN
OPHTHALMIC | Status: AC
  Filled 2021-08-30: qty 10

## 2021-08-30 MED ORDER — OXYCODONE HCL 5 MG/5ML PO SOLN: 5.0000 mg | Freq: Once | ORAL | Status: DC | PRN

## 2021-08-30 MED ORDER — TROPICAMIDE 1 % OP SOLN
1.0000 [drp] | OPHTHALMIC | Status: AC | PRN
  Administered 2021-08-30 (×3): 1 [drp] via OPHTHALMIC
  Filled 2021-08-30 (×2): qty 15

## 2021-08-30 MED ORDER — PREDNISOLONE ACETATE 1 % OP SUSP
OPHTHALMIC | Status: DC | PRN
  Administered 2021-08-30: 1 [drp] via OPHTHALMIC

## 2021-08-30 MED ORDER — STERILE WATER FOR INJECTION IJ SOLN
INTRAMUSCULAR | Status: DC | PRN
  Administered 2021-08-30: 15:00:00 .5 mL

## 2021-08-30 MED ORDER — CEFTAZIDIME 1 G IJ SOLR
INTRAMUSCULAR | Status: AC
  Filled 2021-08-30: qty 1

## 2021-08-30 MED ORDER — ATROPINE SULFATE 1 % OP SOLN
1.0000 [drp] | OPHTHALMIC | Status: AC | PRN
  Administered 2021-08-30 (×3): 1 [drp] via OPHTHALMIC
  Filled 2021-08-30 (×2): qty 5

## 2021-08-30 MED ORDER — 0.9 % SODIUM CHLORIDE (POUR BTL) OPTIME: TOPICAL | Status: DC | PRN

## 2021-08-30 MED ORDER — LIDOCAINE 2% (20 MG/ML) 5 ML SYRINGE
INTRAMUSCULAR | Status: DC | PRN
  Administered 2021-08-30: 100 mg via INTRAVENOUS

## 2021-08-30 MED ORDER — SUGAMMADEX SODIUM 200 MG/2ML IV SOLN
INTRAVENOUS | Status: DC | PRN
  Administered 2021-08-30: 200 mg via INTRAVENOUS

## 2021-08-30 MED ORDER — DEXAMETHASONE SODIUM PHOSPHATE 10 MG/ML IJ SOLN
INTRAMUSCULAR | Status: AC
  Filled 2021-08-30: qty 1

## 2021-08-30 MED ORDER — BSS IO SOLN
INTRAOCULAR | Status: AC
  Filled 2021-08-30: qty 15

## 2021-08-30 MED ORDER — ORAL CARE MOUTH RINSE: 15.0000 mL | Freq: Once | OROMUCOSAL | Status: AC

## 2021-08-30 MED ORDER — BRIMONIDINE TARTRATE 0.2 % OP SOLN
OPHTHALMIC | Status: AC
  Filled 2021-08-30: qty 5

## 2021-08-30 MED ORDER — SODIUM CHLORIDE (PF) 0.9 % IJ SOLN
INTRAMUSCULAR | Status: AC
  Filled 2021-08-30: qty 10

## 2021-08-30 MED ORDER — ACETAZOLAMIDE SODIUM 500 MG IJ SOLR
INTRAMUSCULAR | Status: AC
  Filled 2021-08-30: qty 500

## 2021-08-30 MED ORDER — ACETAMINOPHEN 10 MG/ML IV SOLN: 1000.0000 mg | Freq: Once | INTRAVENOUS | Status: DC | PRN

## 2021-08-30 MED ORDER — POLYMYXIN B SULFATE 500000 UNITS IJ SOLR
INTRAMUSCULAR | Status: AC
  Filled 2021-08-30: qty 500000

## 2021-08-30 MED ORDER — ATROPINE SULFATE 1 % OP SOLN
OPHTHALMIC | Status: AC
  Filled 2021-08-30: qty 5

## 2021-08-30 MED ORDER — DEXAMETHASONE SODIUM PHOSPHATE 10 MG/ML IJ SOLN
INTRAMUSCULAR | Status: DC | PRN
  Administered 2021-08-30: 10 mg via INTRAVENOUS

## 2021-08-30 MED ORDER — FENTANYL CITRATE (PF) 100 MCG/2ML IJ SOLN: 25.0000 ug | INTRAMUSCULAR | Status: DC | PRN

## 2021-08-30 MED ORDER — ATROPINE SULFATE 1 % OP SOLN
OPHTHALMIC | Status: DC | PRN
  Administered 2021-08-30: 1 [drp] via OPHTHALMIC

## 2021-08-30 MED ORDER — TRIAMCINOLONE ACETONIDE 40 MG/ML IJ SUSP
INTRAMUSCULAR | Status: DC | PRN
  Administered 2021-08-30: 40 mg

## 2021-08-30 MED ORDER — DEXTROSE 5 % IV SOLN
INTRAVENOUS | Status: AC
  Filled 2021-08-30: qty 100

## 2021-08-30 MED ORDER — GATIFLOXACIN 0.5 % OP SOLN OPTIME - NO CHARGE
OPHTHALMIC | Status: DC | PRN
  Administered 2021-08-30: 16:00:00 1 [drp] via OPHTHALMIC

## 2021-08-30 MED ORDER — LIDOCAINE HCL (PF) 4 % IJ SOLN
INTRAMUSCULAR | Status: AC
  Filled 2021-08-30: qty 5

## 2021-08-30 MED ORDER — CARBACHOL 0.01 % IO SOLN
INTRAOCULAR | Status: AC
  Filled 2021-08-30: qty 1.5

## 2021-08-30 MED ORDER — MIDAZOLAM HCL 2 MG/2ML IJ SOLN
INTRAMUSCULAR | Status: AC
  Filled 2021-08-30: qty 2

## 2021-08-30 MED ORDER — GLYCOPYRROLATE PF 0.2 MG/ML IJ SOSY
PREFILLED_SYRINGE | INTRAMUSCULAR | Status: DC | PRN
  Administered 2021-08-30: .2 mg via INTRAVENOUS

## 2021-08-30 MED ORDER — EPINEPHRINE PF 1 MG/ML IJ SOLN
INTRAOCULAR | Status: DC | PRN
  Administered 2021-08-30: 15:00:00 500 mL

## 2021-08-30 MED ORDER — ONDANSETRON HCL 4 MG/2ML IJ SOLN
INTRAMUSCULAR | Status: DC | PRN
  Administered 2021-08-30: 4 mg via INTRAVENOUS

## 2021-08-30 MED ORDER — ONDANSETRON HCL 4 MG/2ML IJ SOLN: 4.0000 mg | Freq: Once | INTRAMUSCULAR | Status: DC | PRN

## 2021-08-30 MED ORDER — BACITRACIN-POLYMYXIN B 500-10000 UNIT/GM OP OINT
TOPICAL_OINTMENT | OPHTHALMIC | Status: AC
  Filled 2021-08-30: qty 3.5

## 2021-08-30 MED ORDER — GATIFLOXACIN 0.5 % OP SOLN
OPHTHALMIC | Status: AC
  Filled 2021-08-30: qty 2.5

## 2021-08-30 MED ORDER — EPINEPHRINE PF 1 MG/ML IJ SOLN
INTRAMUSCULAR | Status: AC
  Filled 2021-08-30: qty 1

## 2021-08-30 MED ORDER — BSS PLUS IO SOLN
INTRAOCULAR | Status: AC
  Filled 2021-08-30: qty 500

## 2021-08-30 MED ORDER — LIDOCAINE 2% (20 MG/ML) 5 ML SYRINGE
INTRAMUSCULAR | Status: AC
  Filled 2021-08-30: qty 5

## 2021-08-30 MED ORDER — CHLORHEXIDINE GLUCONATE 0.12 % MT SOLN
OROMUCOSAL | Status: AC
  Filled 2021-08-30: qty 15

## 2021-08-30 MED ORDER — SODIUM CHLORIDE 0.9 % IV SOLN: INTRAVENOUS | Status: DC

## 2021-08-30 MED ORDER — ROCURONIUM BROMIDE 10 MG/ML (PF) SYRINGE
PREFILLED_SYRINGE | INTRAVENOUS | Status: DC | PRN
  Administered 2021-08-30: 50 mg via INTRAVENOUS

## 2021-08-30 MED ORDER — STERILE WATER FOR INJECTION IJ SOLN
INTRAMUSCULAR | Status: DC | PRN
  Administered 2021-08-30: 15:00:00 400 mL

## 2021-08-30 MED ORDER — PROPOFOL 10 MG/ML IV BOLUS
INTRAVENOUS | Status: AC
  Filled 2021-08-30: qty 20

## 2021-08-30 MED ORDER — MIDAZOLAM HCL 5 MG/5ML IJ SOLN
INTRAMUSCULAR | Status: DC | PRN
  Administered 2021-08-30: 2 mg via INTRAVENOUS

## 2021-08-30 MED ORDER — PHENYLEPHRINE HCL 10 % OP SOLN
1.0000 [drp] | OPHTHALMIC | Status: AC | PRN
  Administered 2021-08-30 (×3): 1 [drp] via OPHTHALMIC
  Filled 2021-08-30 (×2): qty 5

## 2021-08-30 MED ORDER — PROPOFOL 10 MG/ML IV BOLUS
INTRAVENOUS | Status: DC | PRN
  Administered 2021-08-30: 140 mg via INTRAVENOUS
  Administered 2021-08-30 (×2): 30 mg via INTRAVENOUS

## 2021-08-30 MED ORDER — LIDOCAINE HCL (PF) 2 % IJ SOLN
INTRAMUSCULAR | Status: AC
  Filled 2021-08-30: qty 10

## 2021-08-30 MED ORDER — PREDNISOLONE ACETATE 1 % OP SUSP
OPHTHALMIC | Status: AC
  Filled 2021-08-30: qty 5

## 2021-08-30 MED ORDER — STERILE WATER FOR INJECTION IJ SOLN
INTRAMUSCULAR | Status: AC
  Filled 2021-08-30: qty 10

## 2021-08-30 MED ORDER — PHENYLEPHRINE 40 MCG/ML (10ML) SYRINGE FOR IV PUSH (FOR BLOOD PRESSURE SUPPORT)
PREFILLED_SYRINGE | INTRAVENOUS | Status: DC | PRN
  Administered 2021-08-30: 40 ug via INTRAVENOUS
  Administered 2021-08-30: 80 ug via INTRAVENOUS

## 2021-08-30 MED ORDER — ONDANSETRON HCL 4 MG/2ML IJ SOLN
INTRAMUSCULAR | Status: AC
  Filled 2021-08-30: qty 2

## 2021-08-30 MED ORDER — GLYCOPYRROLATE PF 0.2 MG/ML IJ SOSY
PREFILLED_SYRINGE | INTRAMUSCULAR | Status: AC
  Filled 2021-08-30: qty 1

## 2021-08-30 MED ORDER — OXYCODONE HCL 5 MG PO TABS: 5.0000 mg | ORAL_TABLET | Freq: Once | ORAL | Status: DC | PRN

## 2021-08-30 MED ORDER — NA CHONDROIT SULF-NA HYALURON 40-30 MG/ML IO SOSY
INTRAOCULAR | Status: DC | PRN
  Administered 2021-08-30: 0.5 mL via INTRAOCULAR

## 2021-08-30 MED ORDER — NA CHONDROIT SULF-NA HYALURON 40-30 MG/ML IO SOSY
INTRAOCULAR | Status: AC
  Filled 2021-08-30: qty 1

## 2021-08-30 MED ORDER — FENTANYL CITRATE (PF) 250 MCG/5ML IJ SOLN
INTRAMUSCULAR | Status: DC | PRN
  Administered 2021-08-30: 100 ug via INTRAVENOUS

## 2021-08-30 MED ORDER — INDOCYANINE GREEN 25 MG IV SOLR
INTRAVENOUS | Status: AC
  Filled 2021-08-30: qty 10

## 2021-08-30 MED ORDER — BUPIVACAINE HCL (PF) 0.75 % IJ SOLN
INTRAMUSCULAR | Status: AC
  Filled 2021-08-30: qty 10

## 2021-08-30 MED ORDER — BALANCED SALT IO SOLN
INTRAOCULAR | Status: DC | PRN
  Administered 2021-08-30: 15 mL via INTRAOCULAR

## 2021-08-30 SURGICAL SUPPLY — 63 items
APPLICATOR COTTON TIP 6 STRL (MISCELLANEOUS) ×4 IMPLANT
APPLICATOR COTTON TIP 6IN STRL (MISCELLANEOUS) ×8
BAG COUNTER SPONGE SURGICOUNT (BAG) ×2 IMPLANT
BAND WRIST GAS GREEN (MISCELLANEOUS) IMPLANT
BLADE EYE CATARACT 19 1.4 BEAV (BLADE) IMPLANT
BNDG EYE OVAL (GAUZE/BANDAGES/DRESSINGS) ×2 IMPLANT
CABLE BIPOLOR RESECTION CORD (MISCELLANEOUS) ×2 IMPLANT
CANNULA ANT CHAM MAIN (OPHTHALMIC RELATED) IMPLANT
CANNULA DUALBORE 25G (CANNULA) ×2 IMPLANT
CANNULA FLEX TIP 25G (CANNULA) ×2 IMPLANT
CANNULA TROCAR 23 GA VLV (OPHTHALMIC) IMPLANT
CANNULA VLV SOFT TIP 25GA (OPHTHALMIC) IMPLANT
CLSR STERI-STRIP ANTIMIC 1/2X4 (GAUZE/BANDAGES/DRESSINGS) ×2 IMPLANT
DRAPE INCISE 51X51 W/FILM STRL (DRAPES) ×2 IMPLANT
DRAPE MICROSCOPE LEICA 46X105 (MISCELLANEOUS) ×2 IMPLANT
DRAPE OPHTHALMIC 77X100 STRL (CUSTOM PROCEDURE TRAY) ×2 IMPLANT
FILTER BLUE MILLIPORE (MISCELLANEOUS) IMPLANT
FILTER STRAW FLUID ASPIR (MISCELLANEOUS) IMPLANT
FORCEPS GRIESHABER ILM 25G A (INSTRUMENTS) IMPLANT
FORCEPS GRIESHABER MAX 25G (MISCELLANEOUS) IMPLANT
GAS AUTO FILL CONSTEL (OPHTHALMIC)
GAS AUTO FILL CONSTELLATION (OPHTHALMIC) IMPLANT
GAS WRIST BAND GREEN (MISCELLANEOUS)
GLOVE SURG MICRO LTX SZ7 (GLOVE) ×4 IMPLANT
GLOVE SURG MICRO LTX SZ7.5 (GLOVE) ×2 IMPLANT
GOWN STRL REUS W/ TWL LRG LVL3 (GOWN DISPOSABLE) ×2 IMPLANT
GOWN STRL REUS W/ TWL XL LVL3 (GOWN DISPOSABLE) ×1 IMPLANT
GOWN STRL REUS W/TWL LRG LVL3 (GOWN DISPOSABLE) ×2
GOWN STRL REUS W/TWL XL LVL3 (GOWN DISPOSABLE) ×1
KIT BASIN OR (CUSTOM PROCEDURE TRAY) ×2 IMPLANT
KIT PERFLUORON PROCEDURE 5ML (MISCELLANEOUS) IMPLANT
LENS VITRECTOMY FLAT OCLR DISP (MISCELLANEOUS) IMPLANT
LOOP FINESSE 25 GA (MISCELLANEOUS) IMPLANT
MICROPICK 25G (MISCELLANEOUS)
NEEDLE 18GX1X1/2 (RX/OR ONLY) (NEEDLE) ×6 IMPLANT
NEEDLE 25GX 5/8IN NON SAFETY (NEEDLE) ×6 IMPLANT
NEEDLE FILTER BLUNT 18X 1/2SAF (NEEDLE) ×1
NEEDLE FILTER BLUNT 18X1 1/2 (NEEDLE) ×1 IMPLANT
NEEDLE HYPO 30X.5 LL (NEEDLE) ×4 IMPLANT
NEEDLE PRECISIONGLIDE 27X1.5 (NEEDLE) IMPLANT
NS IRRIG 1000ML POUR BTL (IV SOLUTION) ×2 IMPLANT
PACK VITRECTOMY CUSTOM (CUSTOM PROCEDURE TRAY) ×2 IMPLANT
PAD ARMBOARD 7.5X6 YLW CONV (MISCELLANEOUS) ×4 IMPLANT
PAK PIK VITRECTOMY CVS 25GA (OPHTHALMIC) ×2 IMPLANT
PENCIL BIPOLAR 25GA STR DISP (OPHTHALMIC RELATED) IMPLANT
PICK MICROPICK 25G (MISCELLANEOUS) IMPLANT
PROBE ENDO DIATHERMY 25G (MISCELLANEOUS) IMPLANT
PROBE LASER ILLUM FLEX CVD 25G (OPHTHALMIC) IMPLANT
REPL STRA BRUSH NEEDLE (NEEDLE) IMPLANT
RESERVOIR BACK FLUSH (MISCELLANEOUS) IMPLANT
RETRACTOR IRIS FLEX 25G GRIESH (INSTRUMENTS) IMPLANT
SCISSORS TIP ADVANCED DSP 25GA (INSTRUMENTS) IMPLANT
SET INJECTOR OIL FLUID CONSTEL (OPHTHALMIC) IMPLANT
SPONGE SURGIFOAM ABS GEL 12-7 (HEMOSTASIS) IMPLANT
STOPCOCK 4 WAY LG BORE MALE ST (IV SETS) IMPLANT
SUT VICRYL 7 0 TG140 8 (SUTURE) ×2 IMPLANT
SYR 10ML LL (SYRINGE) ×2 IMPLANT
SYR 20ML LL LF (SYRINGE) ×2 IMPLANT
SYR 5ML LL (SYRINGE) ×2 IMPLANT
SYR TB 1ML LUER SLIP (SYRINGE) ×4 IMPLANT
TOWEL GREEN STERILE FF (TOWEL DISPOSABLE) ×2 IMPLANT
TUBING HIGH PRESS EXTEN 6IN (TUBING) ×2 IMPLANT
WATER STERILE IRR 1000ML POUR (IV SOLUTION) ×2 IMPLANT

## 2021-08-30 NOTE — Interval H&P Note (Signed)
History and Physical Interval Note:  08/30/2021 2:06 PM  Andrew Mitchell  has presented today for surgery, with the diagnosis of left eye vitreous opacities.  The various methods of treatment have been discussed with the patient and family. After consideration of risks, benefits and other options for treatment, the patient has consented to  Procedure(s): PARS PLANA VITRECTOMY WITH 25 GAUGE (Left) PHOTOCOAGULATION WITH LASER (Left) as a surgical intervention.  The patient's history has been reviewed, patient examined, no change in status, stable for surgery.  I have reviewed the patient's chart and labs.  Questions were answered to the patient's satisfaction.     Andrew Mitchell

## 2021-08-30 NOTE — Anesthesia Preprocedure Evaluation (Addendum)
Anesthesia Evaluation  Patient identified by MRN, date of birth, ID band Patient awake    Reviewed: Allergy & Precautions, H&P , NPO status , Patient's Chart, lab work & pertinent test results, reviewed documented beta blocker date and time   Airway Mallampati: II  TM Distance: >3 FB Neck ROM: Full    Dental no notable dental hx. (+) Dental Advisory Given, Teeth Intact   Pulmonary neg pulmonary ROS,    Pulmonary exam normal breath sounds clear to auscultation       Cardiovascular hypertension, Pt. on medications and Pt. on home beta blockers + CAD, + Past MI, + Cardiac Stents and + DOE  Normal cardiovascular exam Rhythm:Regular Rate:Normal     Neuro/Psych negative neurological ROS  negative psych ROS   GI/Hepatic negative GI ROS, Neg liver ROS,   Endo/Other  negative endocrine ROS  Renal/GU negative Renal ROS  negative genitourinary   Musculoskeletal negative musculoskeletal ROS (+)   Abdominal   Peds negative pediatric ROS (+)  Hematology negative hematology ROS (+)   Anesthesia Other Findings   Reproductive/Obstetrics negative OB ROS                            Anesthesia Physical Anesthesia Plan  ASA: 3  Anesthesia Plan: General   Post-op Pain Management:    Induction: Intravenous  PONV Risk Score and Plan: 2 and Ondansetron, Dexamethasone and Treatment may vary due to age or medical condition  Airway Management Planned: Oral ETT  Additional Equipment:   Intra-op Plan:   Post-operative Plan: Extubation in OR  Informed Consent: I have reviewed the patients History and Physical, chart, labs and discussed the procedure including the risks, benefits and alternatives for the proposed anesthesia with the patient or authorized representative who has indicated his/her understanding and acceptance.     Dental advisory given  Plan Discussed with: CRNA and Surgeon  Anesthesia  Plan Comments:         Anesthesia Quick Evaluation

## 2021-08-30 NOTE — Transfer of Care (Signed)
Immediate Anesthesia Transfer of Care Note  Patient: Andrew Mitchell  Procedure(s) Performed: PARS PLANA VITRECTOMY WITH 25 GAUGE (Left: Eye) PHOTOCOAGULATION WITH LASER (Left: Eye)  Patient Location: PACU  Anesthesia Type:General  Level of Consciousness: oriented, drowsy and patient cooperative  Airway & Oxygen Therapy: Patient Spontanous Breathing and Patient connected to nasal cannula oxygen  Post-op Assessment: Report given to RN and Post -op Vital signs reviewed and stable  Post vital signs: Reviewed  Last Vitals:  Vitals Value Taken Time  BP 144/76 08/30/21 1616  Temp    Pulse 74 08/30/21 1618  Resp 12 08/30/21 1618  SpO2 100 % 08/30/21 1618  Vitals shown include unvalidated device data.  Last Pain:  Vitals:   08/30/21 1248  TempSrc:   PainSc: 0-No pain      Patients Stated Pain Goal: 3 (88/83/58 [id number])  Complications: No notable events documented.

## 2021-08-30 NOTE — Discharge Instructions (Signed)
POSTOPERATIVE INSTRUCTIONS  Your doctor has performed vitreoretinal surgery on you at [facility]. [place] eye patched and shielded until seen by Dr. Coralyn Pear 8 AM tomorrow in clinic - Do not use drops until return - Keep head elevated while awake - Sleep with head elevated to 30-45 degrees    - No strenuous bending, stooping or lifting.  - You may not drive until further notice.  - Tylenol or any other over-the-counter pain reliever can be used according to your doctor. If more pain medicine is required, your doctor will have a prescription for you.  - You may read, go up and down stairs, and watch television.     [name], M.D., Ph.D.

## 2021-08-30 NOTE — Brief Op Note (Signed)
08/30/2021  4:12 PM  PATIENT:  Andrew Mitchell  60 y.o. male  PRE-OPERATIVE DIAGNOSIS:  left eye vitreous opacities  POST-OPERATIVE DIAGNOSIS:  left eye vitreous opacities  PROCEDURE:  Procedure(s): PARS PLANA VITRECTOMY WITH 25 GAUGE (Left) PHOTOCOAGULATION WITH LASER (Left)  SURGEON:  Surgeon(s) and Role:    [name], MD - Primary  ASSISTANTS: Ernest Mallick, Ophthalmic Assistant    ANESTHESIA:   general  EBL:  minimal   BLOOD ADMINISTERED:none  DRAINS: none   LOCAL MEDICATIONS USED:  NONE  SPECIMEN:  No Specimen  DISPOSITION OF SPECIMEN:  N/A  COUNTS:  YES  TOURNIQUET:  * No tourniquets in log *  DICTATION: .Note written in EPIC  PLAN OF CARE: Discharge to home after PACU  PATIENT DISPOSITION:  PACU - hemodynamically stable.   Delay start of Pharmacological VTE agent (>24hrs) due to surgical blood loss or risk of bleeding: no

## 2021-08-30 NOTE — Anesthesia Postprocedure Evaluation (Signed)
Anesthesia Post Note  Patient: Andrew Mitchell  Procedure(s) Performed: PARS PLANA VITRECTOMY WITH 25 GAUGE (Left: Eye) PHOTOCOAGULATION WITH LASER (Left: Eye)     Patient location during evaluation: PACU Anesthesia Type: General Level of consciousness: awake and alert Pain management: pain level controlled Vital Signs Assessment: post-procedure vital signs reviewed and stable Respiratory status: spontaneous breathing, nonlabored ventilation, respiratory function stable and patient connected to nasal cannula oxygen Cardiovascular status: blood pressure returned to baseline and stable Postop Assessment: no apparent nausea or vomiting Anesthetic complications: no   No notable events documented.  Last Vitals:  Vitals:   08/30/21 1645 08/30/21 1700  BP: 126/72 128/71  Pulse: (!) 59 63  Resp: 14 12  Temp:    SpO2: 97% 97%    Last Pain:  Vitals:   08/30/21 1248  TempSrc:   PainSc: 0-No pain                 Cheyan Frees S

## 2021-08-30 NOTE — Op Note (Signed)
Date of procedure: 11.17.2022   Surgeon: [name], M.D., Ph.D    Assistant: Ernest Mallick, OA   Pre-operative Diagnosis:  1. Visually significant vitreous opacities, left eye   Post-operative diagnosis:  1. Visually significant vitreous opacities, left eye   Anesthesia: General   Procedure: 1)     25 gauge pars plana vitrectomy w/ endolaser, Left Eye   Complications: none Estimated blood loss: minimal Specimens: none   Brief history:  Pt has a history of visually significant vitreous opacities of the left eye, which were affecting his activities of daily living. The risks, benefits, and alternatives were explained to the patient, including pain, bleeding, infection, loss of vision, double vision, droopy eyelids, and need for more surgeries.  Informed consent was obtained from the patient and placed in the chart.     Procedure: The patient was brought to the preoperative holding area where the correct eye was confirmed and marked. The patient was then brought to the operating room where general anesthesia was induced by the Anesthesia team. A secondary time-out was performed to identify the correct patient, eye, procedure, and any allergies. The eye was prepped and draped in the usual sterile ophthalmic fashion followed by placement of a lid speculum.    A 25 gauge trocar was placed in the inferotemporal quadrant 3.5 mm posterior to the limbus in a beveled fashion. A 4 mm infusion cannula was placed through this trocar, and the infusion cannula was confirmed in the vitreous cavity with no incarceration of retina or choroid prior to turning it on. Two additional 25 gauge trocars were placed in the superonasal and superotemporal quadrants in a similar beveled fashion. The light pipe and cutter were introduced into the eye and direct vitrectomy was performed to remove the anterior hyaloid.   At this time, a standard three-port pars plana vitrectomy was performed using the light pipe, the  cutter, and the BIOM viewing system. A thorough core and peripheral vitreous dissection was performed. Kenalog was used to highlight the vitreous. Care was taken to insure the posterior vitreous was detached. Peripheral vitrectomy was completed with care using scleral depression. Of note, there were no peripheral retinal breaks or tears. Next, endolaser was used to place 360 prophylactic laser in the periphery.   The trocars were then removed and sutured with 7-0 vicryl in an interrupted fashion. Subconjunctival injections of antibiotic and Kenalog were administered. The lid speculum and drapes were removed. Drops of an antibiotic and steroid were given. The eye was patched and shielded. The patient tolerated the procedure well without any intraoperative or immediate postoperative complications. The patient was taken to the recovery room in good condition. The patient was instructed to follow-up with Dr. Coralyn Pear in clinic on the following morning.

## 2021-08-30 NOTE — Anesthesia Procedure Notes (Signed)
Procedure Name: Intubation Date/Time: 08/30/2021 2:43 PM Performed by: Jenne Campus, CRNA Pre-anesthesia Checklist: Patient identified, Emergency Drugs available, Suction available and Patient being monitored Patient Re-evaluated:Patient Re-evaluated prior to induction Oxygen Delivery Method: Circle System Utilized Preoxygenation: Pre-oxygenation with 100% oxygen Induction Type: IV induction Ventilation: Mask ventilation without difficulty Tube type: Oral Tube size: 7.5 mm Number of attempts: 1 Airway Equipment and Method: Stylet and Oral airway Placement Confirmation: ETT inserted through vocal cords under direct vision, positive ETCO2 and breath sounds checked- equal and bilateral Secured at: 21 cm Tube secured with: Tape Dental Injury: Teeth and Oropharynx as per pre-operative assessment

## 2021-08-30 NOTE — Progress Notes (Signed)
Patient's BP arriving to Short Stay 161/69 Pulse: 54  Patient takes Metoprolol 2x / day.  Last dose was taken yesterday.  Dr. Kalman Shan notified.  Instructed not to give Metoprolol at this time.    No new orders given.

## 2021-08-31 ENCOUNTER — Encounter (HOSPITAL_COMMUNITY): Payer: Self-pay | Admitting: Ophthalmology

## 2021-08-31 ENCOUNTER — Ambulatory Visit (INDEPENDENT_AMBULATORY_CARE_PROVIDER_SITE_OTHER): Payer: 59 | Admitting: Ophthalmology

## 2021-08-31 DIAGNOSIS — H43393 Other vitreous opacities, bilateral: Secondary | ICD-10-CM

## 2021-08-31 DIAGNOSIS — H35033 Hypertensive retinopathy, bilateral: Secondary | ICD-10-CM

## 2021-08-31 DIAGNOSIS — Z961 Presence of intraocular lens: Secondary | ICD-10-CM

## 2021-08-31 DIAGNOSIS — H35351 Cystoid macular degeneration, right eye: Secondary | ICD-10-CM

## 2021-08-31 DIAGNOSIS — I10 Essential (primary) hypertension: Secondary | ICD-10-CM

## 2021-08-31 DIAGNOSIS — H3581 Retinal edema: Secondary | ICD-10-CM

## 2021-09-01 ENCOUNTER — Other Ambulatory Visit: Payer: Self-pay | Admitting: Interventional Cardiology

## 2021-09-03 ENCOUNTER — Ambulatory Visit
Admission: RE | Admit: 2021-09-03 | Discharge: 2021-09-03 | Disposition: A | Discharge status: Home / Self Care | Payer: 59 | Source: Ambulatory Visit | Attending: Home Modifications | Admitting: Home Modifications

## 2021-09-03 DIAGNOSIS — N492 Inflammatory disorders of scrotum: Secondary | ICD-10-CM

## 2021-09-03 NOTE — Progress Notes (Addendum)
[facility] Note  09/05/2021     CHIEF COMPLAINT Patient presents for Post-op Follow-up  HISTORY OF PRESENT ILLNESS: Andrew Mitchell is a 60 y.o. male who presents to the clinic today for:  HPI     Post-op Follow-up   In left eye.  Discomfort includes none.  Vision is improved.        Comments   60 y/o male pt here for  5 day POV.  S/p PPV/EL OS for vit. synerisis/floaters OS on 11.17.22.  VA OS improved, and only sees one floater now from time to time.  No change in [place] OD.  Denies pain, FOL.  PF 6x.day OS, Zymaxid QID OS, PSO ung QID OS (has not used yet today).  Pt reports he is not using Lotemax, Prolensa or Dorzolamide currently.      Last edited by [name], COA on 09/05/2021  8:03 AM.    Pt states vision is better, he still sees one dot, but cannot tell if it's in his right or left eye, he states his eyelid is irritated from the sutres   Referring physician: [name], MD No address on file  HISTORICAL INFORMATION:  Selected notes from the [facility] Referred by Dr. [name] for eval of CME   CURRENT MEDICATIONS: Current Outpatient Medications (Ophthalmic Drugs)  Medication Sig   prednisoLONE acetate (PRED FORTE) 1 % ophthalmic suspension Place 1 drop into the left eye 6 (six) times daily.   Bromfenac Sodium (PROLENSA) 0.07 % SOLN INSTILL 1 DROP IN RIGHT EYE FOUR TIMES DAILY (Patient not taking: Reported on 09/05/2021)   dorzolamide (TRUSOPT) 2 % ophthalmic solution Place into both eyes 2 (two) times daily. (Patient not taking: Reported on 09/05/2021)   loteprednol (LOTEMAX) 0.5 % ophthalmic suspension Place 1 drop into the right eye 3 (three) times daily. (Patient not taking: Reported on 09/05/2021)   No current facility-administered medications for this visit. (Ophthalmic Drugs)   Current Outpatient Medications (Other)  Medication Sig   carbidopa-levodopa (SINEMET IR) 25-100 MG per tablet Take 1/2 to 1  tablet by mouth daily before bedtime.   cephALEXin (KEFLEX) 500 MG capsule Take 500 mg by mouth 2 (two) times daily.   clopidogrel (PLAVIX) 75 MG tablet TAKE 1 TABLET BY MOUTH EVERY DAY   ezetimibe (ZETIA) 10 MG tablet TAKE 1 TABLET BY MOUTH EVERY DAY   famotidine (PEPCID) 40 MG tablet Take 40 mg by mouth daily.   HYDROcodone-acetaminophen (NORCO/VICODIN) 5-325 MG tablet Take by mouth.   metoprolol tartrate (LOPRESSOR) 25 MG tablet Take 1 tablet (25 mg total) by mouth 2 (two) times daily. Please keep upcoming appt in March 2023 with Tamala Julian before anymore refills. Thank you   Multiple Vitamin (MULTIVITAMIN) capsule Take 1 capsule by mouth daily.   Multiple Vitamins-Minerals (CENTRUM PO) Take 1 tablet by mouth daily.   nitroGLYCERIN (NITROSTAT) 0.4 MG SL tablet Place 1 tablet (0.4 mg total) under the tongue every 5 (five) minutes x 3 doses as needed for chest pain.   rosuvastatin (CRESTOR) 5 MG tablet TAKE 1 TABLET BY MOUTH EVERY DAY   zolpidem (AMBIEN) 5 MG tablet Take 10 mg by mouth at bedtime as needed for sleep.   No current facility-administered medications for this visit. (Other)   REVIEW OF SYSTEMS: ROS   Positive for: Musculoskeletal, Cardiovascular, Eyes, Psychiatric Negative for: Constitutional, Gastrointestinal, Neurological, Skin, Genitourinary, HENT, Endocrine, Respiratory, Allergic/Imm, Heme/Lymph Last edited by [name], COA on 09/05/2021  8:01 AM.      ALLERGIES Allergies  Allergen Reactions   Shellfish Allergy Anaphylaxis    Just to shrimp   PAST MEDICAL HISTORY Past Medical History:  Diagnosis Date   Back pain    CAD (coronary artery disease)    a. 09/2018 Inf STEMI/PCI: LM nl, LAD min irregs, D1/2 min irregs, LCX 100ost/p thrombotic (2.75x35 Orsiro DES), RCA 40d, RPAV 90.   Carpal tunnel syndrome    DDD (degenerative disc disease)    Diastolic dysfunction    a. 09/2018 Echo: EF 50-55%, prob inflat and antlat HK, Gr1 DD. Mildlly dil Ao root (74m). Nl RV  fxn.   Hyperlipemia    Hypertension    Hypertensive retinopathy    OU   Mildly Dilated aortic root ([place])    a. 09/2018 Echo: 84mm.   Tubular adenocarcinoma ([place]) 2010   colon   Past Surgical History:  Procedure Laterality Date   CARPAL TUNNEL RELEASE Left 01/06/2013   Procedure: CARPAL TUNNEL RELEASE;  Surgeon: Wynonia Sours, MD;  Location: [facility];  Service: Orthopedics;  Laterality: Left;  ANESTHESIA: IV REGIONAL FAB   CATARACT EXTRACTION Bilateral 2019   Dr. Peterson [facility] [place]   COLONOSCOPY     CORONARY ANGIOGRAPHY N/A 10/08/2018   Procedure: CORONARY ANGIOGRAPHY;  Surgeon: Sherren Mocha, MD;  Location: [facility];  Service: Cardiovascular;  Laterality: N/A;   CORONARY STENT INTERVENTION N/A 10/08/2018   Procedure: CORONARY STENT INTERVENTION;  Surgeon: Sherren Mocha, MD;  Location: [facility];  Service: Cardiovascular;  Laterality: N/A;   CORONARY/GRAFT ACUTE MI REVASCULARIZATION N/A 10/08/2018   Procedure: Coronary/Graft Acute MI Revascularization;  Surgeon: Sherren Mocha, MD;  Location: [facility];  Service: Cardiovascular;  Laterality: N/A;   EYE SURGERY Bilateral 2019   [facility] - Dr. [name]   KNEE ARTHROSCOPY     rightx2   PARS PLANA VITRECTOMY Left 08/30/2021   Procedure: PARS PLANA VITRECTOMY WITH 25 GAUGE;  Surgeon: [name], MD;  Location: [facility];  Service: Ophthalmology;  Laterality: Left;   PHOTOCOAGULATION WITH LASER Left 08/30/2021   Procedure: PHOTOCOAGULATION WITH LASER;  Surgeon: [name], MD;  Location: [facility];  Service: Ophthalmology;  Laterality: Left;   RIB RESECTION  2003   thorasic outlet syndrome-rt   SHOULDER ARTHROSCOPY     rightx2   TONSILLECTOMY     FAMILY HISTORY History reviewed. No pertinent family history.  SOCIAL HISTORY Social History   Tobacco Use   Smoking status: Never   Smokeless tobacco: Never  Vaping Use   Vaping Use: Never used  Substance Use Topics   Alcohol use:  Yes    Comment: occ   Drug use: No       OPHTHALMIC EXAM:  Base Eye Exam     Visual Acuity (Snellen - Linear)       Right Left   Dist [place] 20/30 -2 20/25 -2   Dist ph  20/20 -2 20/20 -2         Tonometry (Tonopen, 8:04 AM)       Right Left   Pressure 18 13         Pupils       Dark Light Shape React APD   Right 4 3 Round Brisk None   Left 4 3 Round Brisk None         Visual Fields (Counting fingers)       Left Right    Full Full  Extraocular Movement       Right Left    Full, Ortho Full, Ortho         Neuro/Psych     Oriented x3: Yes   Mood/Affect: Normal         Dilation     Both eyes: 1.0% Mydriacyl, 2.5% Phenylephrine @ 8:04 AM           Slit Lamp and Fundus Exam     Slit Lamp Exam       Right Left   Lids/Lashes Dermatochalasis - upper lid, healing stye RLL Dermatochalasis - upper lid   Conjunctiva/Sclera White and quiet Subconjunctival hemorrhage - improved, 1+Injection, sutures intact, nasal pingeucula   Cornea Mild arcus, well healed temporal cataract wounds, trace endo pigment well healed temporal cataract wounds, trace PEE, mild tear film debris   Anterior Chamber Deep, 0.5+pigment 1+cell/pigment   Iris Round and reactive Round and dilated to 7.42mm   Lens MF PC IOL in perfect position with open PC MF PC IOL in perfect position with open PC   Anterior Vitreous Vitreous syneresis, Posterior vitreous detachment, vitreous condensations -- prominent linear white condensation overlying IT macula post vitrectomy; clear; vitreous opacities gone         Fundus Exam       Right Left   Disc Pink and Sharp, Compact Pink and Sharp   C/D Ratio 0.4 0.5   Macula Flat, good foveal reflex, interval improvement in Cystic changes temporal fovea, mild RPE mottling and clumping Flat, good foveal reflex, mild RPE mottling and clumping, No heme or edema   Vessels mild attenuation, mild tortuousity mild attenuation, mild tortuousity    Periphery Attached, No heme, No RT/RD Attached, No heme, No RT/RD, good 360 early laser changes            IMAGING AND PROCEDURES  Imaging and Procedures for 09/05/2021  OCT, Retina - OU - Both Eyes       Right Eye Quality was good. Central Foveal Thickness: 345. Progression has improved. Findings include no SRF, vitreomacular adhesion , no IRF, normal foveal contour (Interval improvement in tr cystic changes temp fovea and macula).   Left Eye Quality was good. Central Foveal Thickness: 342. Progression has been stable. Findings include normal foveal contour, no IRF, no SRF, vitreomacular adhesion .   Notes *Images captured and stored on drive  Diagnosis / Impression:  OD: Interval improvement in tr cystic changes temp fovea and macula OS: NFP, no IRF/SRF  Clinical management:  See below  Abbreviations: NFP - Normal foveal profile. CME - cystoid macular edema. PED - pigment epithelial detachment. IRF - intraretinal fluid. SRF - subretinal fluid. EZ - ellipsoid zone. ERM - epiretinal membrane. ORA - outer retinal atrophy. ORT - outer retinal tubulation. SRHM - subretinal hyper-reflective material. IRHM - intraretinal hyper-reflective material               ASSESSMENT/PLAN:    ICD-10-CM   1. Cystoid macular edema of right eye  H35.351     2. Retinal edema  H35.81 OCT, Retina - OU - Both Eyes    3. Vitreous syneresis of both eyes  H43.393     4. Vitreous floaters of both eyes  H43.393     5. Essential hypertension  I10     6. Hypertensive retinopathy of both eyes  H35.033     7. Pseudophakia, both eyes  Z96.1       1,2. CME OD  - initially diagnosed w/ CME  OU by Dr. [name] on 08.16.21 and started on Durezol and BromSite TID OU  - s/p STK OD #1, 09.30.22  - today BCVA 20/20 OD -- improved  - IOP 23,17 -- history of steroid response but okay today  - OCT shows OD: Interval improvement in tr cystic changes temp fovea and macula  - has not been taking  since surgery OS: Lotemax SM TID OD, Prolensa TID OD, Dorz BID OU  - restart lotemax SM and Prolensa BID OD only -- hold Dorzolamide for now  - history of IOP steroid response   - monitor  3,4. Vitreous syneresis / floaters OU  - prominent symptomatic floaters OU  - s/p YAG vitreolysis OD 3.4.2020 ([place])  - POW1 s/p PPV/EL OS, 11.17.22             - doing well this morning with floaters gone OS             - IOP good at 13             - cont   PF 6x/day OS -- begin PF taper Sunday -- 4,3,2,1 drops daily, decrease weekly                          zymaxid QID OS -- stop on Sunday                         PSO ung QID OS -- dec to qhs and prn on Sunday             - eye shield when sleeping x1 more week             - post op drop and positioning instructions reviewed              - tylenol/ibuprofen for pain   - f/u Dec. 15/16th-- POV w/ DFE, OCT  - **planning for PPV/EL OD on 12.29.22**  5,6. Hypertensive retinopathy OU - discussed importance of tight BP control - monitor    7. Pseudophakia OU  - s/p CE/IOL OU 2019  - multifocal IOLs in perfect position, doing well  - monitor    Ophthalmic Meds Ordered this visit:  Meds ordered this encounter  Medications   prednisoLONE acetate (PRED FORTE) 1 % ophthalmic suspension    Sig: Place 1 drop into the left eye 6 (six) times daily.    Dispense:  15 mL    Refill:  0      Return for f/u Dec. 15/16th, floaters OS , DFE, OCT, POV.  There are no Patient Instructions on file for this visit.  This document serves as a record of services personally performed by Gardiner Sleeper, MD, PhD. It was created on their behalf by Orvan Falconer, an ophthalmic technician. The creation of this record is the provider's dictation and/or activities during the visit.    Electronically signed by: Orvan Falconer, OA, 09/05/21  8:39 AM  This document serves as a record of services personally performed by Gardiner Sleeper, MD, PhD. It was created on  their behalf by San Jetty. Owens Shark, OA an ophthalmic technician. The creation of this record is the provider's dictation and/or activities during the visit.    Electronically signed by: San Jetty. Owens Shark, [place] 11.23.2022 8:39 AM  Gardiner Sleeper, M.D., Ph.D. Diseases & Surgery of the Retina and Vitreous [facility]  I have reviewed the above  documentation for accuracy and completeness, and I agree with the above. Gardiner Sleeper, M.D., Ph.D. 09/05/21 8:39 AM  Abbreviations: M myopia (nearsighted); A astigmatism; H hyperopia (farsighted); P presbyopia; Mrx spectacle prescription;  CTL contact lenses; OD right eye; OS left eye; OU both eyes  XT exotropia; ET esotropia; PEK punctate epithelial keratitis; PEE punctate epithelial erosions; DES dry eye syndrome; MGD meibomian gland dysfunction; ATs artificial tears; PFAT's preservative free artificial tears; [place] nuclear sclerotic cataract; PSC posterior subcapsular cataract; ERM epi-retinal membrane; PVD posterior vitreous detachment; RD retinal detachment; DM diabetes mellitus; DR diabetic retinopathy; NPDR non-proliferative diabetic retinopathy; PDR proliferative diabetic retinopathy; CSME clinically significant macular edema; DME diabetic macular edema; dbh dot blot hemorrhages; CWS cotton wool spot; POAG primary open angle glaucoma; C/D cup-to-disc ratio; HVF humphrey visual field; GVF goldmann visual field; OCT optical coherence tomography; IOP intraocular pressure; BRVO Branch retinal vein occlusion; CRVO central retinal vein occlusion; CRAO central retinal artery occlusion; BRAO branch retinal artery occlusion; RT retinal tear; SB scleral buckle; PPV pars plana vitrectomy; VH Vitreous hemorrhage; PRP panretinal laser photocoagulation; IVK intravitreal kenalog; VMT vitreomacular traction; MH Macular hole;  NVD neovascularization of the disc; NVE neovascularization elsewhere; AREDS age related eye disease study; ARMD age related macular  degeneration; POAG primary open angle glaucoma; EBMD epithelial/anterior basement membrane dystrophy; ACIOL anterior chamber intraocular lens; IOL intraocular lens; PCIOL posterior chamber intraocular lens; Phaco/IOL phacoemulsification with intraocular lens placement; [place] photorefractive keratectomy; LASIK laser assisted in situ keratomileusis; HTN hypertension; DM diabetes mellitus; COPD chronic obstructive pulmonary disease

## 2021-09-05 ENCOUNTER — Other Ambulatory Visit: Payer: Self-pay

## 2021-09-05 ENCOUNTER — Encounter (INDEPENDENT_AMBULATORY_CARE_PROVIDER_SITE_OTHER): Payer: Self-pay | Admitting: Ophthalmology

## 2021-09-05 ENCOUNTER — Ambulatory Visit (INDEPENDENT_AMBULATORY_CARE_PROVIDER_SITE_OTHER): Payer: 59 | Admitting: Ophthalmology

## 2021-09-05 DIAGNOSIS — H35033 Hypertensive retinopathy, bilateral: Secondary | ICD-10-CM | POA: Diagnosis not present

## 2021-09-05 DIAGNOSIS — I10 Essential (primary) hypertension: Secondary | ICD-10-CM

## 2021-09-05 DIAGNOSIS — Z961 Presence of intraocular lens: Secondary | ICD-10-CM

## 2021-09-05 DIAGNOSIS — H3581 Retinal edema: Secondary | ICD-10-CM

## 2021-09-05 DIAGNOSIS — H35351 Cystoid macular degeneration, right eye: Secondary | ICD-10-CM | POA: Diagnosis not present

## 2021-09-05 DIAGNOSIS — H43393 Other vitreous opacities, bilateral: Secondary | ICD-10-CM

## 2021-09-05 MED ORDER — PREDNISOLONE ACETATE 1 % OP SUSP: 1.0000 [drp] | Freq: Every day | OPHTHALMIC | 0 refills | Status: DC

## 2021-09-11 ENCOUNTER — Encounter: Payer: Self-pay | Admitting: Interventional Cardiology

## 2021-09-12 ENCOUNTER — Encounter: Payer: Self-pay | Admitting: Interventional Cardiology

## 2021-09-12 ENCOUNTER — Ambulatory Visit: Payer: 59 | Admitting: Interventional Cardiology

## 2021-09-12 ENCOUNTER — Other Ambulatory Visit: Payer: Self-pay

## 2021-09-12 VITALS — BP 126/84 | HR 64 | Ht 70.0 in | Wt 189.4 lb

## 2021-09-12 DIAGNOSIS — R0609 Other forms of dyspnea: Secondary | ICD-10-CM

## 2021-09-12 DIAGNOSIS — I25110 Atherosclerotic heart disease of native coronary artery with unstable angina pectoris: Secondary | ICD-10-CM

## 2021-09-12 DIAGNOSIS — E785 Hyperlipidemia, unspecified: Secondary | ICD-10-CM

## 2021-09-12 DIAGNOSIS — I7123 Aneurysm of the descending thoracic aorta, without rupture: Secondary | ICD-10-CM

## 2021-09-12 DIAGNOSIS — I10 Essential (primary) hypertension: Secondary | ICD-10-CM | POA: Diagnosis not present

## 2021-09-12 DIAGNOSIS — R0602 Shortness of breath: Secondary | ICD-10-CM

## 2021-09-12 NOTE — Telephone Encounter (Signed)
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

## 2021-09-12 NOTE — Telephone Encounter (Signed)
Spoke with pt and made him aware of info from [facility].  Pt states that he had eye surgery 3 weeks ago and then the following week had skin cancer removed from his arm.  Been having intermittent breathing issues since then.  When he lays down he can't seem to catch his breath.  Has to sit up and take deep breaths until it passes, then he can finally get some sleep.  Walked 4 miles the other day with no SOB or CP.  No vitals available.  Occasionally has SOB during the day even if he's not doing anything.  Scheduled pt to come in this afternoon for evaluation.  Pt appreciative for call.

## 2021-09-12 NOTE — Patient Instructions (Signed)
Medication Instructions:  Your physician recommends that you continue on your current medications as directed. Please refer to the Current Medication list given to you today.  *If you need a refill on your cardiac medications before your next appointment, please call your pharmacy*   Lab Work: Pro BNP today  If you have labs (blood work) drawn today and your tests are completely normal, you will receive your results only by: [place] (if you have MyChart) OR A paper copy in the mail If you have any lab test that is abnormal or we need to change your treatment, we will call you to review the results.   Testing/Procedures: None   Follow-Up: Keep current follow up   Other Instructions

## 2021-09-12 NOTE — Progress Notes (Signed)
Cardiology Office Note:    Date:  09/12/2021   ID:  Hilberto, Burzynski 1960/12/09, MRN [id number]  PCP:  [name], MD  Cardiologist:  Sinclair Grooms, MD   Referring MD: [name], MD   Chief Complaint  Patient presents with   Shortness of Breath     History of Present Illness:    Andrew Mitchell is a 60 y.o. male with a hx of  DOE, family h/o CAD (father with MI) , hyperlipidemia,  inferior MI 2019 treated with RCA DES.    Nonspecific episodes of shortness of breath occurring at rest.  No associated palpitations or chest discomfort.  No lower extremity swelling.  The shortness of breath is worse when trying to lie down.  Episodes last less than 5 minutes.  No sweating, nausea, or other complaints associated.  Past Medical History:  Diagnosis Date   Back pain    CAD (coronary artery disease)    a. 09/2018 Inf STEMI/PCI: LM nl, LAD min irregs, D1/2 min irregs, LCX 100ost/p thrombotic (2.75x35 Orsiro DES), RCA 40d, RPAV 90.   Carpal tunnel syndrome    DDD (degenerative disc disease)    Diastolic dysfunction    a. 09/2018 Echo: EF 50-55%, prob inflat and antlat HK, Gr1 DD. Mildlly dil Ao root (68m). Nl RV fxn.   Hyperlipemia    Hypertension    Hypertensive retinopathy    OU   Mildly Dilated aortic root ([place])    a. 09/2018 Echo: 32mm.   Tubular adenocarcinoma ([place]) 2010   colon    Past Surgical History:  Procedure Laterality Date   CARPAL TUNNEL RELEASE Left 01/06/2013   Procedure: CARPAL TUNNEL RELEASE;  Surgeon: Wynonia Sours, MD;  Location: [facility];  Service: Orthopedics;  Laterality: Left;  ANESTHESIA: IV REGIONAL FAB   CATARACT EXTRACTION Bilateral 2019   Dr. Peterson Ao [place]   COLONOSCOPY     CORONARY ANGIOGRAPHY N/A 10/08/2018   Procedure: CORONARY ANGIOGRAPHY;  Surgeon: Sherren Mocha, MD;  Location: [facility];  Service: Cardiovascular;  Laterality: N/A;   CORONARY STENT INTERVENTION N/A 10/08/2018   Procedure: CORONARY STENT  INTERVENTION;  Surgeon: Sherren Mocha, MD;  Location: [facility];  Service: Cardiovascular;  Laterality: N/A;   CORONARY/GRAFT ACUTE MI REVASCULARIZATION N/A 10/08/2018   Procedure: Coronary/Graft Acute MI Revascularization;  Surgeon: Sherren Mocha, MD;  Location: [facility];  Service: Cardiovascular;  Laterality: N/A;   EYE SURGERY Bilateral 2019   Cat Sx - Dr. [name]   KNEE ARTHROSCOPY     rightx2   PARS PLANA VITRECTOMY Left 08/30/2021   Procedure: PARS PLANA VITRECTOMY WITH 25 GAUGE;  Surgeon: [name], MD;  Location: [facility];  Service: Ophthalmology;  Laterality: Left;   PHOTOCOAGULATION WITH LASER Left 08/30/2021   Procedure: PHOTOCOAGULATION WITH LASER;  Surgeon: [name], MD;  Location: [facility];  Service: Ophthalmology;  Laterality: Left;   RIB RESECTION  2003   thorasic outlet syndrome-rt   SHOULDER ARTHROSCOPY     rightx2   TONSILLECTOMY      Current Medications: Current Meds  Medication Sig   Bromfenac Sodium (PROLENSA) 0.07 % SOLN INSTILL 1 DROP IN RIGHT EYE FOUR TIMES DAILY   carbidopa-levodopa (SINEMET IR) 25-100 MG per tablet Take 1/2 to 1 tablet by mouth daily before bedtime.   cephALEXin (KEFLEX) 500 MG capsule Take 500 mg by mouth 2 (two) times daily.   clopidogrel (PLAVIX) 75 MG tablet TAKE 1 TABLET  BY MOUTH EVERY DAY   dorzolamide (TRUSOPT) 2 % ophthalmic solution Place into both eyes 2 (two) times daily.   ezetimibe (ZETIA) 10 MG tablet TAKE 1 TABLET BY MOUTH EVERY DAY   famotidine (PEPCID) 40 MG tablet Take 40 mg by mouth daily.   loteprednol (LOTEMAX) 0.5 % ophthalmic suspension Place 1 drop into the right eye 3 (three) times daily.   metoprolol tartrate (LOPRESSOR) 25 MG tablet Take 1 tablet (25 mg total) by mouth 2 (two) times daily. Please keep upcoming appt in March 2023 with Tamala Julian before anymore refills. Thank you   Multiple Vitamin (MULTIVITAMIN) capsule Take 1 capsule by mouth daily.   Multiple Vitamins-Minerals (CENTRUM  PO) Take 1 tablet by mouth daily.   nitroGLYCERIN (NITROSTAT) 0.4 MG SL tablet Place 1 tablet (0.4 mg total) under the tongue every 5 (five) minutes x 3 doses as needed for chest pain.   prednisoLONE acetate (PRED FORTE) 1 % ophthalmic suspension Place 1 drop into the left eye 6 (six) times daily.   rosuvastatin (CRESTOR) 5 MG tablet TAKE 1 TABLET BY MOUTH EVERY DAY   zolpidem (AMBIEN) 5 MG tablet Take 10 mg by mouth at bedtime as needed for sleep.     Allergies:   Shellfish allergy   Social History   Socioeconomic History   Marital status: Married    Spouse name: Not on file   Number of children: Not on file   Years of education: Not on file   Highest education level: Not on file  Occupational History   Not on file  Tobacco Use   Smoking status: Never   Smokeless tobacco: Never  Vaping Use   Vaping Use: Never used  Substance and Sexual Activity   Alcohol use: Yes    Comment: occ   Drug use: No   Sexual activity: Not on file  Other Topics Concern   Not on file  Social History Narrative   Not on file   Social Determinants of Health   Financial Resource Strain: Not on file  Food Insecurity: Not on file  Transportation Needs: Not on file  Physical Activity: Not on file  Stress: Not on file  Social Connections: Not on file     Family History: The patient's family history is not on file.  ROS:   Please see the history of present illness.    Had a scrotal mass.  This is being evaluated.  He is concerned about the labs from that.  He wanted me to look at the results but he cannot pull them up on his phone.  I asked that he discuss this with his primary physician.  All other systems reviewed and are negative.  EKGs/Labs/Other Studies Reviewed:    The following studies were reviewed today: Chest CT 2020: IMPRESSION: 1. Mild ectasia of the ascending thoracic aorta measuring 3.3 cm in AP diameter. No evidence of thoracic aortic aneurysm.   2.  Mild atherosclerotic  coronary artery disease.  2D Doppler echocardiogram 10/09/2018: Study Conclusions   - Left ventricle: The cavity size was normal. Systolic function was    normal. The estimated ejection fraction was in the range of 50%    to 55%. Probable hypokinesis of the inferolateral and    anterolateral walls. Suboptimal image quality despite the use of    Definity. Doppler parameters are consistent with abnormal left    ventricular relaxation (grade 1 diastolic dysfunction).  - Aortic valve: Transvalvular velocity was within the normal range.  There was no stenosis. There was no regurgitation.  - Aorta: Aortic root dimension: 42 mm (ED). Mid-ascending aortic    diameter: 39 mm (ED).  - Aortic root: The aortic root was mildly dilated.  - Ascending aorta: The ascending aorta was mildly dilated.  - Mitral valve: Transvalvular velocity was within the normal range.    There was no evidence for stenosis. There was no regurgitation.  - Left atrium: The atrium was normal in size.  - Right ventricle: The cavity size was normal. Wall thickness was    normal. Systolic function was normal.  - Right atrium: The atrium was normal in size.  - Tricuspid valve: There was no regurgitation.  - Inferior vena cava: The vessel was normal in size. The    respirophasic diameter changes were in the normal range (>= 50%),    consistent with normal central venous pressure.  - Pericardium, extracardiac: There was no pericardial effusion.   EKG:  EKG sinus rhythm, heart rate 64 bpm, otherwise normal.  Recent Labs: 08/30/2021: BUN 15; Creatinine, Ser 0.86; Hemoglobin 15.4; Platelets 208; Potassium 3.6; Sodium 135  Recent Lipid Panel    Component Value Date/Time   CHOL 117 07/25/2020 0000   TRIG 90 07/25/2020 0000   HDL 40 07/25/2020 0000   CHOLHDL 2.9 07/25/2020 0000   CHOLHDL 3.3 10/09/2018 0028   VLDL 7 10/09/2018 0028   LDLCALC 60 07/25/2020 0000    Physical Exam:    VS:  BP 126/84   Pulse 64   Ht 5'  10" (1.778 m)   Wt 189 lb 6.4 oz (85.9 kg)   SpO2 96%   BMI 27.18 kg/m     Wt Readings from Last 3 Encounters:  09/12/21 189 lb 6.4 oz (85.9 kg)  08/30/21 182 lb (82.6 kg)  11/06/20 190 lb 3.2 oz (86.3 kg)     General: Appears healthy. No acute distress HEENT: Normal NECK: No JVD. LYMPHATICS: No lymphadenopathy CARDIAC: No murmur. RRR no gallop, or edema. VASCULAR:  Normal Pulses. No bruits. RESPIRATORY:  Clear to auscultation without rales, wheezing or rhonchi  ABDOMEN: Soft, non-tender, non-distended, No pulsatile mass, MUSCULOSKELETAL: No deformity  SKIN: Warm and dry NEUROLOGIC:  Alert and oriented x 3 PSYCHIATRIC:  Normal affect   ASSESSMENT:    1. Coronary artery disease involving native coronary artery of native heart with unstable angina pectoris ([place])   2. Hyperlipidemia, unspecified hyperlipidemia type   3. Essential hypertension   4. Aneurysm of descending thoracic aorta without rupture   5. DOE (dyspnea on exertion)   6. SOB (shortness of breath)    PLAN:    In order of problems listed above:  No symptoms to suggest angina. Continue statin therapy. Most recent LDL was 60.  Recent blood work with Dr. Harrington Challenger.  Not currently available here. Aortic diameter 4.2 cm by echo in 2019.  Aortic diameter was 3.3 by CT scan in 2020. BNP.  Clinical follow-up for dyspnea.  Episodes are unusual as they occur spontaneously.  He can walk for miles and not feel short of breath or have chest discomfort.  I wonder if he is having an arrhythmia.  For the time being we will go with clinical observation.   Medication Adjustments/Labs and Tests Ordered: Current medicines are reviewed at length with the patient today.  Concerns regarding medicines are outlined above.  Orders Placed This Encounter  Procedures   Pro b natriuretic peptide   EKG 12-Lead    No orders of the defined  types were placed in this encounter.   Patient Instructions  Medication Instructions:  Your  physician recommends that you continue on your current medications as directed. Please refer to the Current Medication list given to you today.  *If you need a refill on your cardiac medications before your next appointment, please call your pharmacy*   Lab Work: Pro BNP today  If you have labs (blood work) drawn today and your tests are completely normal, you will receive your results only by: [facility] (if you have MyChart) OR A paper copy in the mail If you have any lab test that is abnormal or we need to change your treatment, we will call you to review the results.   Testing/Procedures: None   Follow-Up: Keep current follow up   Other Instructions     Signed, Sinclair Grooms, MD  09/12/2021 5:04 PM    [place]

## 2021-09-13 LAB — PRO B NATRIURETIC PEPTIDE: NT-Pro BNP: 86 pg/mL (ref 0–210)

## 2021-09-25 NOTE — Progress Notes (Signed)
[facility] Note  09/27/2021     CHIEF COMPLAINT Patient presents for Post-op Follow-up  HISTORY OF PRESENT ILLNESS: Andrew Mitchell is a 60 y.o. male who presents to the clinic today for:  HPI     Post-op Follow-up   In left eye.  Discomfort includes none.  Vision is improved.  I, the attending physician,  performed the HPI with the patient and updated documentation appropriately.        Comments   60 y/o male pt here for POV OS.  S/p PPV/EL OS 11.17.22.  Surgical planning today for OD.  VA OS continues to improve.  No change in [place] OS.  Denies pain, FOL, new floaters.  Pred BID OS.  Lotemax SM and Prolensa BID OD.      Last edited by Andrew Caffey, MD on 09/27/2021 10:59 AM.    Pt states right eye floaters are bothering him a lot more now that left eye has been cleaned out   Referring physician: [name], MD [place],  [place] [phone]  HISTORICAL INFORMATION:  Selected notes from the [facility] Referred by Dr. Lucita Mitchell for eval of CME   CURRENT MEDICATIONS: Current Outpatient Medications (Ophthalmic Drugs)  Medication Sig   Bromfenac Sodium (PROLENSA) 0.07 % SOLN INSTILL 1 DROP IN RIGHT EYE FOUR TIMES DAILY   dorzolamide (TRUSOPT) 2 % ophthalmic solution Place into both eyes 2 (two) times daily.   loteprednol (LOTEMAX) 0.5 % ophthalmic suspension Place 1 drop into the right eye 3 (three) times daily.   prednisoLONE acetate (PRED FORTE) 1 % ophthalmic suspension Place 1 drop into the left eye 6 (six) times daily.   No current facility-administered medications for this visit. (Ophthalmic Drugs)   Current Outpatient Medications (Other)  Medication Sig   carbidopa-levodopa (SINEMET IR) 25-100 MG per tablet Take 1/2 to 1 tablet by mouth daily before bedtime.   cephALEXin (KEFLEX) 500 MG capsule Take 500 mg by mouth 2 (two) times daily.   clopidogrel (PLAVIX) 75 MG tablet TAKE 1 TABLET BY MOUTH EVERY DAY    ezetimibe (ZETIA) 10 MG tablet TAKE 1 TABLET BY MOUTH EVERY DAY   famotidine (PEPCID) 40 MG tablet Take 40 mg by mouth daily.   HYDROcodone-acetaminophen (NORCO/VICODIN) 5-325 MG tablet Take by mouth. (Patient not taking: Reported on 09/12/2021)   metoprolol tartrate (LOPRESSOR) 25 MG tablet Take 1 tablet (25 mg total) by mouth 2 (two) times daily. Please keep upcoming appt in March 2023 with Andrew Mitchell before anymore refills. Thank you   Multiple Vitamin (MULTIVITAMIN) capsule Take 1 capsule by mouth daily.   Multiple Vitamins-Minerals (CENTRUM PO) Take 1 tablet by mouth daily.   nitroGLYCERIN (NITROSTAT) 0.4 MG SL tablet Place 1 tablet (0.4 mg total) under the tongue every 5 (five) minutes x 3 doses as needed for chest pain.   rosuvastatin (CRESTOR) 5 MG tablet TAKE 1 TABLET BY MOUTH EVERY DAY   zolpidem (AMBIEN) 5 MG tablet Take 10 mg by mouth at bedtime as needed for sleep.   No current facility-administered medications for this visit. (Other)   REVIEW OF SYSTEMS: ROS   Positive for: HENT, Cardiovascular, Eyes, Psychiatric Negative for: Constitutional, Gastrointestinal, Neurological, Skin, Genitourinary, Musculoskeletal, Endocrine, Respiratory, Allergic/Imm, Heme/Lymph Last edited by [name], COA on 09/27/2021  8:02 AM.       ALLERGIES Allergies  Allergen Reactions   Shellfish Allergy Anaphylaxis    Just to shrimp   PAST MEDICAL HISTORY  Past Medical History:  Diagnosis Date   Back pain    CAD (coronary artery disease)    a. 09/2018 Inf STEMI/PCI: LM nl, LAD min irregs, D1/2 min irregs, LCX 100ost/p thrombotic (2.75x35 Orsiro DES), RCA 40d, RPAV 90.   Carpal tunnel syndrome    DDD (degenerative disc disease)    Diastolic dysfunction    a. 09/2018 Echo: EF 50-55%, prob inflat and antlat HK, Gr1 DD. Mildlly dil Ao root (47m). Nl RV fxn.   Hyperlipemia    Hypertension    Hypertensive retinopathy    OU   Mildly Dilated aortic root ([place])    a. 09/2018 Echo: 68mm.    Tubular adenocarcinoma ([place]) 2010   colon   Past Surgical History:  Procedure Laterality Date   CARPAL TUNNEL RELEASE Left 01/06/2013   Procedure: CARPAL TUNNEL RELEASE;  Surgeon: Andrew Sours, MD;  Location: [facility];  Service: Orthopedics;  Laterality: Left;  ANESTHESIA: IV REGIONAL FAB   CATARACT EXTRACTION Bilateral 2019   Dr. Peterson Ao Mitchell   COLONOSCOPY     CORONARY ANGIOGRAPHY N/A 10/08/2018   Procedure: CORONARY ANGIOGRAPHY;  Surgeon: Andrew Mocha, MD;  Location: [facility];  Service: Cardiovascular;  Laterality: N/A;   CORONARY STENT INTERVENTION N/A 10/08/2018   Procedure: CORONARY STENT INTERVENTION;  Surgeon: Andrew Mocha, MD;  Location: [facility];  Service: Cardiovascular;  Laterality: N/A;   CORONARY/GRAFT ACUTE MI REVASCULARIZATION N/A 10/08/2018   Procedure: Coronary/Graft Acute MI Revascularization;  Surgeon: Andrew Mocha, MD;  Location: [facility];  Service: Cardiovascular;  Laterality: N/A;   EYE SURGERY Bilateral 2019   Cat Sx - Dr. Vevelyn Mitchell   KNEE ARTHROSCOPY     rightx2   PARS PLANA VITRECTOMY Left 08/30/2021   Procedure: PARS PLANA VITRECTOMY WITH 25 GAUGE;  Surgeon: Andrew Caffey, MD;  Location: [facility];  Service: Ophthalmology;  Laterality: Left;   PHOTOCOAGULATION WITH LASER Left 08/30/2021   Procedure: PHOTOCOAGULATION WITH LASER;  Surgeon: Andrew Caffey, MD;  Location: [facility];  Service: Ophthalmology;  Laterality: Left;   RIB RESECTION  2003   thorasic outlet syndrome-rt   SHOULDER ARTHROSCOPY     rightx2   TONSILLECTOMY     FAMILY HISTORY History reviewed. No pertinent family history.  SOCIAL HISTORY Social History   Tobacco Use   Smoking status: Never   Smokeless tobacco: Never  Vaping Use   Vaping Use: Never used  Substance Use Topics   Alcohol use: Yes    Comment: occ   Drug use: No       OPHTHALMIC EXAM:  Base Eye Exam     Visual Acuity (Snellen - Linear)       Right Left    Dist [place] 20/30 -2 20/25 -2   Dist ph [place] 20/20 - 20/20 -         Tonometry (Tonopen, 8:08 AM)       Right Left   Pressure 18 15         Pupils       Dark Light Shape React APD   Right 4 3 Round Brisk None   Left 4 3 Round Brisk None         Visual Fields (Counting fingers)       Left Right    Full Full         Extraocular Movement       Right Left    Full, Ortho Full, Ortho  Neuro/Psych     Oriented x3: Yes   Mood/Affect: Normal         Dilation     Both eyes: 1.0% Mydriacyl, 2.5% Phenylephrine @ 8:08 AM           Slit Lamp and Fundus Exam     Slit Lamp Exam       Right Left   Lids/Lashes Dermatochalasis - upper lid, healing stye RLL Dermatochalasis - upper lid   Conjunctiva/Sclera White and quiet Subconjunctival hemorrhage - improved, 1+Injection, sutures intact, nasal pingeucula   Cornea Mild arcus, well healed temporal cataract wounds, trace endo pigment well healed temporal cataract wounds, trace PEE, mild tear film debris, mild arcus   Anterior Chamber Deep, 0.5+pigment Deep and quiet   Iris Round and reactive Round and dilated to 7.20mm   Lens MF PC IOL in perfect position with open PC MF PC IOL in perfect position with open PC   Anterior Vitreous Vitreous syneresis, Posterior vitreous detachment, vitreous condensations -- prominent linear white condensation overlying IT macula post vitrectomy; clear; vitreous opacities gone         Fundus Exam       Right Left   Disc Pink and Sharp, Compact Pink and Sharp   C/D Ratio 0.4 0.5   Macula Flat, good foveal reflex, trace Cystic changes temporal fovea, mild RPE mottling and clumping Flat, good foveal reflex, mild RPE mottling and clumping, No heme or edema   Vessels mild attenuation, mild tortuousity mild attenuation, mild tortuousity   Periphery Attached, No heme, No RT/RD Attached, No heme, No RT/RD, good 360 early laser changes            IMAGING AND PROCEDURES  Imaging  and Procedures for 09/27/2021  OCT, Retina - OU - Both Eyes       Right Eye Quality was good. Central Foveal Thickness: 345. Progression has been stable. Findings include no SRF, vitreomacular adhesion , no IRF, normal foveal contour (tr cystic changes temp fovea and macula).   Left Eye Quality was good. Central Foveal Thickness: 339. Progression has been stable. Findings include normal foveal contour, no IRF, no SRF, vitreomacular adhesion .   Notes *Images captured and stored on drive  Diagnosis / Impression:  OD: tr cystic changes temp fovea and macula OS: NFP, no IRF/SRF  Clinical management:  See below  Abbreviations: NFP - Normal foveal profile. CME - cystoid macular edema. PED - pigment epithelial detachment. IRF - intraretinal fluid. SRF - subretinal fluid. EZ - ellipsoid zone. ERM - epiretinal membrane. ORA - outer retinal atrophy. ORT - outer retinal tubulation. SRHM - subretinal hyper-reflective material. IRHM - intraretinal hyper-reflective material                ASSESSMENT/PLAN:    ICD-10-CM   1. Cystoid macular edema of right eye  H35.351 OCT, Retina - OU - Both Eyes    2. Vitreous syneresis of both eyes  H43.393     3. Vitreous floaters of both eyes  H43.393 OCT, Retina - OU - Both Eyes    4. Essential hypertension  I10     5. Hypertensive retinopathy of both eyes  H35.033     6. Pseudophakia, both eyes  Z96.1       1. CME OD  - initially diagnosed w/ CME OU by Dr. Lucita Mitchell on 08.16.21 and started on Durezol and BromSite TID OU  - s/p STK OD #1, 09.30.22  - today BCVA 20/20 OD -- stable  -  IOP 18,15 -- history of steroid response but okay today  - OCT shows OD: tr cystic changes temp fovea and macula  - cont lotemax SM and Prolensa BID OD only -- hold Dorzolamide for now  - history of IOP steroid response   - monitor  2,3. Vitreous syneresis / floaters OU  - prominent symptomatic floaters OU  - s/p YAG vitreolysis OD 3.4.2020  (Mitchell)  - POW4 s/p PPV/EL OS, 11.17.22             - doing well with floaters stably improved OS             - IOP good at 15             - cont  PF taper OS - 2,1 drops daily, decrease weekly  PSO ung QHS / PRN OS             - post op drop and positioning instructions reviewed              - tylenol/ibuprofen for pain   - pt wishes to proceed with surgery to remove floaters for OD on 12.29.22  - recommend PPV w/ EL OD under GA - RBA of procedure discussed, questions answered - informed consent obtained and signed  - f/u Dec. 30 -- POV w/ DFE, OCT  4,5. Hypertensive retinopathy OU - discussed importance of tight BP control - monitor    6. Pseudophakia OU  - s/p CE/IOL OU 2019  - multifocal IOLs in perfect position, doing well  - monitor    Ophthalmic Meds Ordered this visit:  No orders of the defined types were placed in this encounter.    Return in about 15 days (around 10/12/2021) for POV.  There are no Patient Instructions on file for this visit.  This document serves as a record of services personally performed by Gardiner Sleeper, MD, PhD. It was created on their behalf by San Jetty. Owens Shark, OA an ophthalmic technician. The creation of this record is the provider's dictation and/or activities during the visit.    Electronically signed by: San Jetty. Owens Shark, [place] 12.13.2022 11:08 AM  Gardiner Sleeper, M.D., Ph.D. Diseases & Surgery of the Retina and Vitreous [facility]  I have reviewed the above documentation for accuracy and completeness, and I agree with the above. Gardiner Sleeper, M.D., Ph.D. 09/27/21 11:08 AM   Abbreviations: M myopia (nearsighted); A astigmatism; H hyperopia (farsighted); P presbyopia; Mrx spectacle prescription;  CTL contact lenses; OD right eye; OS left eye; OU both eyes  XT exotropia; ET esotropia; PEK punctate epithelial keratitis; PEE punctate epithelial erosions; DES dry eye syndrome; MGD meibomian gland dysfunction;  ATs artificial tears; PFAT's preservative free artificial tears; [place] nuclear sclerotic cataract; PSC posterior subcapsular cataract; ERM epi-retinal membrane; PVD posterior vitreous detachment; RD retinal detachment; DM diabetes mellitus; DR diabetic retinopathy; NPDR non-proliferative diabetic retinopathy; PDR proliferative diabetic retinopathy; CSME clinically significant macular edema; DME diabetic macular edema; dbh dot blot hemorrhages; CWS cotton wool spot; POAG primary open angle glaucoma; C/D cup-to-disc ratio; HVF humphrey visual field; GVF goldmann visual field; OCT optical coherence tomography; IOP intraocular pressure; BRVO Branch retinal vein occlusion; CRVO central retinal vein occlusion; CRAO central retinal artery occlusion; BRAO branch retinal artery occlusion; RT retinal tear; SB scleral buckle; PPV pars plana vitrectomy; VH Vitreous hemorrhage; PRP panretinal laser photocoagulation; IVK intravitreal kenalog; VMT vitreomacular traction; MH Macular hole;  NVD neovascularization of the disc; NVE neovascularization elsewhere;  AREDS age related eye disease study; ARMD age related macular degeneration; POAG primary open angle glaucoma; EBMD epithelial/anterior basement membrane dystrophy; ACIOL anterior chamber intraocular lens; IOL intraocular lens; PCIOL posterior chamber intraocular lens; Phaco/IOL phacoemulsification with intraocular lens placement; [place] photorefractive keratectomy; LASIK laser assisted in situ keratomileusis; HTN hypertension; DM diabetes mellitus; COPD chronic obstructive pulmonary disease

## 2021-09-27 ENCOUNTER — Other Ambulatory Visit: Payer: Self-pay

## 2021-09-27 ENCOUNTER — Ambulatory Visit (INDEPENDENT_AMBULATORY_CARE_PROVIDER_SITE_OTHER): Payer: 59 | Admitting: Ophthalmology

## 2021-09-27 ENCOUNTER — Encounter (INDEPENDENT_AMBULATORY_CARE_PROVIDER_SITE_OTHER): Payer: Self-pay | Admitting: Ophthalmology

## 2021-09-27 DIAGNOSIS — H35033 Hypertensive retinopathy, bilateral: Secondary | ICD-10-CM

## 2021-09-27 DIAGNOSIS — H35351 Cystoid macular degeneration, right eye: Secondary | ICD-10-CM

## 2021-09-27 DIAGNOSIS — Z961 Presence of intraocular lens: Secondary | ICD-10-CM

## 2021-09-27 DIAGNOSIS — I10 Essential (primary) hypertension: Secondary | ICD-10-CM

## 2021-09-27 DIAGNOSIS — H43393 Other vitreous opacities, bilateral: Secondary | ICD-10-CM

## 2021-09-27 DIAGNOSIS — H3581 Retinal edema: Secondary | ICD-10-CM

## 2021-10-03 NOTE — Progress Notes (Signed)
[facility] Note  10/12/2021     CHIEF COMPLAINT Patient presents for Retina Follow Up   HISTORY OF PRESENT ILLNESS: Andrew Mitchell is a 60 y.o. male who presents to the clinic today for:  HPI     Retina Follow Up   Patient presents with  Other.  In right eye.  Severity is moderate.  Duration of 1 day.  Since onset it is stable.  I, the attending physician,  performed the HPI with the patient and updated documentation appropriately.        Comments   Pt here for 1 day PO PPV w/ EL OD. Pt states OD is doing ok, no major pain or discomfort this morning.       Last edited by [name], MD on 10/12/2021  8:33 AM.      Referring physician: [name], MD [place] [place],  [place] [address]  HISTORICAL INFORMATION:  Selected notes from the MEDICAL RECORD NUMBER Referred by Dr. [name] for eval of CME   CURRENT MEDICATIONS: Current Outpatient Medications (Ophthalmic Drugs)  Medication Sig   Bromfenac Sodium (PROLENSA) 0.07 % SOLN INSTILL 1 DROP IN RIGHT EYE FOUR TIMES DAILY (Patient taking differently: Place 1 drop into the right eye in the morning.)   LOTEMAX SM 0.38 % GEL Place 1 drop into the right eye 2 (two) times daily.   loteprednol (LOTEMAX) 0.5 % ophthalmic suspension Place 1 drop into the right eye 3 (three) times daily. (Patient not taking: Reported on 10/12/2021)   prednisoLONE acetate (PRED FORTE) 1 % ophthalmic suspension Place 1 drop into the left eye 6 (six) times daily. (Patient taking differently: Place 1 drop into the left eye daily.)   No current facility-administered medications for this visit. (Ophthalmic Drugs)   Current Outpatient Medications (Other)  Medication Sig   carbidopa-levodopa (SINEMET IR) 25-100 MG per tablet Take 0.5 tablets by mouth at bedtime. Take 1/2 to 1 tablet by mouth daily before bedtime.   clopidogrel (PLAVIX) 75 MG tablet TAKE 1 TABLET BY MOUTH EVERY DAY   ezetimibe  (ZETIA) 10 MG tablet TAKE 1 TABLET BY MOUTH EVERY DAY   famotidine (PEPCID) 40 MG tablet Take 40 mg by mouth at bedtime.   metoprolol tartrate (LOPRESSOR) 25 MG tablet Take 1 tablet (25 mg total) by mouth 2 (two) times daily. Please keep upcoming appt in March 2023 with Tamala Julian before anymore refills. Thank you   Multiple Vitamin (MULTIVITAMIN WITH MINERALS) TABS tablet Take 1 tablet by mouth in the morning. Centrum Multivitamin   nitroGLYCERIN (NITROSTAT) 0.4 MG SL tablet Place 1 tablet (0.4 mg total) under the tongue every 5 (five) minutes x 3 doses as needed for chest pain.   rosuvastatin (CRESTOR) 5 MG tablet TAKE 1 TABLET BY MOUTH EVERY DAY (Patient taking differently: Take 5 mg by mouth at bedtime.)   zolpidem (AMBIEN) 5 MG tablet Take 10 mg by mouth at bedtime as needed for sleep.   No current facility-administered medications for this visit. (Other)   REVIEW OF SYSTEMS: ROS   Positive for: HENT, Cardiovascular, Eyes, Psychiatric Negative for: Constitutional, Gastrointestinal, Neurological, Skin, Genitourinary, Musculoskeletal, Endocrine, Respiratory, Allergic/Imm, Heme/Lymph Last edited by [name], COT on 10/12/2021  8:20 AM.     ALLERGIES Allergies  Allergen Reactions   Shellfish Allergy Anaphylaxis    Just to shrimp   PAST MEDICAL HISTORY Past Medical History:  Diagnosis Date   Back pain  CAD (coronary artery disease)    a. 09/2018 Inf STEMI/PCI: LM nl, LAD min irregs, D1/2 min irregs, LCX 100ost/p thrombotic (2.75x35 Orsiro DES), RCA 40d, RPAV 90.   Carpal tunnel syndrome    DDD (degenerative disc disease)    Diastolic dysfunction    a. 09/2018 Echo: EF 50-55%, prob inflat and antlat HK, Gr1 DD. Mildlly dil Ao root (46m). Nl RV fxn.   Hyperlipemia    Hypertension    Hypertensive retinopathy    OU   Mildly Dilated aortic root ([place])    a. 09/2018 Echo: 69mm.   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 Peterson [facility]   COLONOSCOPY     CORONARY ANGIOGRAPHY N/A 10/08/2018   Procedure: CORONARY ANGIOGRAPHY;  Surgeon: Sherren Mocha, MD;  Location: [place] [facility];  Service: Cardiovascular;  Laterality: N/A;   CORONARY STENT INTERVENTION N/A 10/08/2018   Procedure: CORONARY STENT INTERVENTION;  Surgeon: Sherren Mocha, MD;  Location: [place] [facility];  Service: Cardiovascular;  Laterality: N/A;   CORONARY/GRAFT ACUTE MI REVASCULARIZATION N/A 10/08/2018   Procedure: Coronary/Graft Acute MI Revascularization;  Surgeon: Sherren Mocha, MD;  Location: [place] [facility];  Service: Cardiovascular;  Laterality: N/A;   EYE SURGERY Bilateral 2019   Cat Sx - Dr. [name]   KNEE ARTHROSCOPY     rightx2   PARS PLANA VITRECTOMY Left 08/30/2021   Procedure: PARS PLANA VITRECTOMY WITH 25 GAUGE;  Surgeon: [name], MD;  Location: [facility];  Service: Ophthalmology;  Laterality: Left;   PARS PLANA VITRECTOMY Right 10/11/2021   Procedure: [place] VITRECTOMY;  Surgeon: [name], MD;  Location: [place];  Service: Ophthalmology;  Laterality: Right;   PHOTOCOAGULATION WITH LASER Left 08/30/2021   Procedure: PHOTOCOAGULATION WITH LASER;  Surgeon: [name], MD;  Location: [facility];  Service: Ophthalmology;  Laterality: Left;   PHOTOCOAGULATION WITH LASER Right 10/11/2021   Procedure: PHOTOCOAGULATION WITH LASER;  Surgeon: [name], MD;  Location: [facility];  Service: Ophthalmology;  Laterality: Right;   RIB RESECTION  2003   thorasic outlet syndrome-rt   SHOULDER ARTHROSCOPY     rightx2   TONSILLECTOMY     FAMILY HISTORY History reviewed. No pertinent family history.  SOCIAL HISTORY Social History   Tobacco  Use   Smoking status: Never   Smokeless tobacco: Never  Vaping Use   Vaping Use: Never used  Substance Use Topics   Alcohol use: Yes    Comment: occ   Drug use: No       OPHTHALMIC EXAM:  Base Eye Exam     Visual Acuity (Snellen - Linear)       Right Left   Dist [place] 20/40 -2 def   Dist ph  20/25 -2          Tonometry (Tonopen, 8:22 AM)       Right Left   Pressure 24 def         Pupils       Dark   Right Dilated   Left          Visual Fields (Counting fingers)       Left Right    Full  Full         Extraocular Movement       Right Left    Full, Ortho Full, Ortho         Neuro/Psych     Oriented x3: Yes   Mood/Affect: Normal         Dilation     Right eye: 1.0% Mydriacyl, 2.5% Phenylephrine @ 8:23 AM           Slit Lamp and Fundus Exam     Slit Lamp Exam       Right Left   Lids/Lashes Dermatochalasis - upper lid, UL telangiectasia Dermatochalasis - upper lid   Conjunctiva/Sclera White and quiet, mild [place] nasally, sutures intact Subconjunctival hemorrhage - improved, 1+Injection, sutures intact, nasal pingeucula   Cornea Mild arcus, well healed temporal cataract wounds, trace endo pigment well healed temporal cataract wounds, trace PEE, mild tear film debris, mild arcus   Anterior Chamber Deep, 3+ cell and pigment Deep and quiet   Iris Round and dilated Round and dilated to 7.6mm   Lens MF PC IOL in perfect position with open PC MF PC IOL in perfect position with open PC   Anterior Vitreous Post vitrectomy, vitreous condensations and opacities gone post vitrectomy; clear; vitreous opacities gone         Fundus Exam       Right Left   Disc Pink and Sharp, Compact    C/D Ratio 0.5    Macula Flat, good foveal reflex, trace cystic changes temporal fovea, mild RPE mottling and clumping    Vessels mild attenuation, mild tortuousity    Periphery Attached, No heme, No RT/RD, 360 peripheral laser changes    OS not dilated today             IMAGING AND PROCEDURES  Imaging and Procedures for 10/12/2021              ASSESSMENT/PLAN:    ICD-10-CM   1. Cystoid macular edema of right eye  H35.351     2. Vitreous syneresis of both eyes  H43.393     3. Vitreous floaters of both eyes  H43.393     4. Essential hypertension  I10     5. Hypertensive retinopathy of both eyes  H35.033     6. Pseudophakia, both eyes  Z96.1      1. CME OD  - initially diagnosed w/ CME OU by Dr. [name] on 08.16.21 and started on Durezol and BromSite TID OU  - s/p STK OD #1, 09.30.22  - today BCVA 20/25 OD -- 1 day POV s/p PPV w/ EL for vitreous opacities  - IOP 24 -- history of steroid response but okay today  - history of IOP steroid response   - monitor   2,3. Vitreous syneresis / floaters OU  - prominent symptomatic floaters OU  - s/p YAG vitreolysis OD 3.4.2020 ([facility])  - POW4 s/p PPV/EL OS, 11.17.22 - tapered off all drops, BCVA 20/20             - POD1 s/p PPV/EL OD, 12.29.22             - doing well this morning             - IOP mildly elevated OD -- 24             - start   PF 4x/day OD  zymaxid QID OD                          Atropine BID OD                          Brimonidine BID OD                          PSO ung QID OD              - eye shield when sleeping              - post op drop and positioning instructions reviewed              - tylenol/ibuprofen for pain              - Rx given for breakthrough pain   - f/u Thursday, 1.5.23 -- POV w/ DFE, OCT  4,5. Hypertensive retinopathy OU - discussed importance of tight BP control - monitor   6. Pseudophakia OU  - s/p CE/IOL OU 2019  - multifocal IOLs in perfect position, doing well  - monitor   Ophthalmic Meds Ordered this visit:  No orders of the defined types were placed in this encounter.     Return in about 6 days (around 10/18/2021) for POV.  There are no Patient Instructions on file for this  visit.  This document serves as a record of services personally performed by Gardiner Sleeper, MD, PhD. It was created on their behalf by Estill Bakes, COT an ophthalmic technician. The creation of this record is the provider's dictation and/or activities during the visit.    Electronically signed by: Estill Bakes, COT 12.30.22 @ 12:38 AM   Gardiner Sleeper, M.D., Ph.D. Diseases & Surgery of the Retina and [place] 10/12/2021  I have reviewed the above documentation for accuracy and completeness, and I agree with the above. Gardiner Sleeper, M.D., Ph.D. 10/13/21 12:38 AM   Abbreviations: M myopia (nearsighted); A astigmatism; H hyperopia (farsighted); P presbyopia; Mrx spectacle prescription;  CTL contact lenses; OD right eye; OS left eye; OU both eyes  XT exotropia; ET esotropia; PEK punctate epithelial keratitis; PEE punctate epithelial erosions; DES dry eye syndrome; MGD meibomian gland dysfunction; ATs artificial tears; PFAT's preservative free artificial tears; [place] nuclear sclerotic cataract; PSC posterior subcapsular cataract; ERM epi-retinal membrane; PVD posterior vitreous detachment; RD retinal detachment; DM diabetes mellitus; DR diabetic retinopathy; NPDR non-proliferative diabetic retinopathy; PDR proliferative diabetic retinopathy; CSME clinically significant macular edema; DME diabetic macular edema; dbh dot blot hemorrhages; CWS cotton wool spot; POAG primary open angle glaucoma; C/D cup-to-disc ratio; HVF humphrey visual field; GVF goldmann visual field; OCT optical coherence tomography; IOP intraocular pressure; BRVO Branch retinal vein occlusion; CRVO central retinal vein occlusion; CRAO central retinal artery occlusion; BRAO branch retinal artery occlusion; RT retinal tear; SB scleral buckle; PPV pars plana vitrectomy; VH Vitreous hemorrhage; PRP panretinal laser photocoagulation; IVK intravitreal kenalog; VMT vitreomacular traction; MH Macular  hole;  NVD neovascularization of the disc; NVE neovascularization elsewhere; AREDS age related eye disease study; ARMD age related macular degeneration; POAG primary open angle glaucoma; EBMD epithelial/anterior basement membrane dystrophy; ACIOL anterior chamber intraocular lens; IOL intraocular lens; PCIOL posterior chamber intraocular lens; Phaco/IOL phacoemulsification with intraocular lens placement; [place] photorefractive keratectomy; LASIK laser assisted in situ keratomileusis; HTN hypertension; DM diabetes mellitus; COPD  chronic obstructive pulmonary disease

## 2021-10-05 ENCOUNTER — Other Ambulatory Visit: Payer: Self-pay

## 2021-10-05 ENCOUNTER — Encounter (HOSPITAL_COMMUNITY): Payer: Self-pay | Admitting: Ophthalmology

## 2021-10-05 NOTE — H&P (Signed)
Andrew Mitchell is an 60 y.o. male.    Chief Complaint: visually significant vitreous opacities, RIGHT EYE  HPI: Pt with chronic, recurrent CME OD and prominent symptomatic floaters OU, s/p YAG vitreolysis with Dr. [name]. Over the course of 14 mos of retinal care, the patient has demonstrated significant impact of floaters to activities of daily living. After a discussion of the risks, benefits and alternatives to surgery, the patient has elected to proceed with surgery to clear the visually significant vitreous opacities from the right eye -- 25g PPV w/ endolaser OD under general anesthesia.    Past Medical History:  Diagnosis Date   Back pain    CAD (coronary artery disease)    a. 09/2018 Inf STEMI/PCI: LM nl, LAD min irregs, D1/2 min irregs, LCX 100ost/p thrombotic (2.75x35 Orsiro DES), RCA 40d, RPAV 90.   Carpal tunnel syndrome    DDD (degenerative disc disease)    Diastolic dysfunction    a. 09/2018 Echo: EF 50-55%, prob inflat and antlat HK, Gr1 DD. Mildlly dil Ao root (22m). Nl RV fxn.   Hyperlipemia    Hypertension    Hypertensive retinopathy    OU   Mildly Dilated aortic root ([place])    a. 09/2018 Echo: 84mm.   Tubular adenocarcinoma ([place]) 2010   colon    Past Surgical History:  Procedure Laterality Date   CARPAL TUNNEL RELEASE Left 01/06/2013   Procedure: CARPAL TUNNEL RELEASE;  Surgeon: Wynonia Sours, MD;  Location: [facility];  Service: Orthopedics;  Laterality: Left;  ANESTHESIA: IV REGIONAL FAB   CATARACT EXTRACTION Bilateral 2019   Dr. Peterson [facility]   COLONOSCOPY     CORONARY ANGIOGRAPHY N/A 10/08/2018   Procedure: CORONARY ANGIOGRAPHY;  Surgeon: Sherren Mocha, MD;  Location: [facility];  Service: Cardiovascular;  Laterality: N/A;   CORONARY STENT INTERVENTION N/A 10/08/2018   Procedure: CORONARY STENT INTERVENTION;  Surgeon: Sherren Mocha, MD;  Location: [facility];  Service: Cardiovascular;  Laterality: N/A;    CORONARY/GRAFT ACUTE MI REVASCULARIZATION N/A 10/08/2018   Procedure: Coronary/Graft Acute MI Revascularization;  Surgeon: Sherren Mocha, MD;  Location: [facility];  Service: Cardiovascular;  Laterality: N/A;   EYE SURGERY Bilateral 2019   Cat Sx - Dr. [name]   KNEE ARTHROSCOPY     rightx2   PARS PLANA VITRECTOMY Left 08/30/2021   Procedure: PARS PLANA VITRECTOMY WITH 25 GAUGE;  Surgeon: [name], MD;  Location: [facility];  Service: Ophthalmology;  Laterality: Left;   PHOTOCOAGULATION WITH LASER Left 08/30/2021   Procedure: PHOTOCOAGULATION WITH LASER;  Surgeon: [name], MD;  Location: [facility];  Service: Ophthalmology;  Laterality: Left;   RIB RESECTION  2003   thorasic outlet syndrome-rt   SHOULDER ARTHROSCOPY     rightx2   TONSILLECTOMY      History reviewed. No pertinent family history. Social History:  reports that he has never smoked. He has never used smokeless tobacco. He reports current alcohol use. He reports that he does not use drugs.  Allergies:  Allergies  Allergen Reactions   Shellfish Allergy Anaphylaxis    Just to shrimp    No medications prior to admission.    Review of systems otherwise negative  There were no vitals taken for this visit.  Physical exam: Mental status: oriented x3. Eyes: See eye exam associated with this date of surgery Ears, Nose, Throat: within normal limits Neck: Within Normal limits General: within normal limits Chest: Within normal limits Breast: deferred  Heart: Within normal limits Abdomen: Within normal limits GU: deferred Extremities: within normal limits Skin: within normal limits  Assessment/Plan Visually significant vitreous opacities, RIGHT EYE  Plan: To [facility] for 25g PPV w/ endolaser, RIGHT EYE, under general anesthesia - case scheduled for Thursday, Dec 29, [facility] OR 08 - pt should stop Plavix 5 days prior to surgery  Gardiner Sleeper, M.D., Ph.D. Vitreoretinal Surgeon [facility]

## 2021-10-05 NOTE — Progress Notes (Addendum)
PCP - Dr. [name] Cardiologist - Dr. Pernell Dupre EKG - 09/12/21 Chest x-ray - 10/08/18 ECHO - 10/09/18 Cardiac Cath - 10/08/18 CPAP - no OSA Blood Thinner Instructions: Will call about Plavix instruction  Anesthesia review: no  -------------  SDW INSTRUCTIONS:  Your procedure is scheduled on Thursday December 29th. Please report to [facility] Main Entrance "A" at 9 A.M., and check in at the Admitting office. Call this number if you have problems the morning of surgery: [phone]   Remember: Do not eat  after midnight the night before your surgery  You may drink clear liquids until 8:30am the morning of your surgery.   Clear liquids allowed are: Water, Non-Citrus Juices (without pulp), Carbonated Beverages, Clear Tea, Black Coffee Only, and Gatorade   Medications to take morning of surgery with a sip of water include: ezetimibe (ZETIA) 10 MG tablet metoprolol tartrate (LOPRESSOR) 25 MG tablet nitroGLYCERIN (NITROSTAT) 0.4 MG SL tablet if needed   Follow your surgeon's instructions on when to stop Plavix.  If no instructions were given by your surgeon then you will need to call the office to get those instructions.    As of today, STOP taking any Aspirin (unless otherwise instructed by your surgeon), Aleve, Naproxen, Ibuprofen, Motrin, Advil, Goody's, BC's, all herbal medications, fish oil, and all vitamins.    The Morning of Surgery Do not wear jewelry or nail polish. Do not wear lotions, powders, or colognes, or deodorant Do not bring valuables to the hospital. [facility] is not responsible for any belongings or valuables.  If you are a smoker, DO NOT Smoke 24 hours prior to surgery  If you wear a CPAP at night please bring your mask the morning of surgery   Remember that you must have someone to transport you home after your surgery, and remain with you for 24 hours if you are discharged the same day.  Please bring cases for contacts, glasses, hearing aids,  dentures or bridgework because it cannot be worn into surgery.   Patients discharged the day of surgery will not be allowed to drive home.   Please shower the NIGHT BEFORE/MORNING OF SURGERY (use antibacterial soap like DIAL soap if possible). Wear comfortable clothes the morning of surgery. Oral Hygiene is also important to reduce your risk of infection.  Remember - BRUSH YOUR TEETH THE MORNING OF SURGERY WITH YOUR REGULAR TOOTHPASTE  Patient denies shortness of breath, fever, cough and chest pain.

## 2021-10-11 ENCOUNTER — Encounter (HOSPITAL_COMMUNITY): Payer: Self-pay | Admitting: Ophthalmology

## 2021-10-11 ENCOUNTER — Ambulatory Visit (HOSPITAL_COMMUNITY): Payer: 59 | Admitting: Physician Assistant

## 2021-10-11 ENCOUNTER — Other Ambulatory Visit: Payer: Self-pay

## 2021-10-11 ENCOUNTER — Ambulatory Visit (HOSPITAL_COMMUNITY)
Admission: RE | Admit: 2021-10-11 | Discharge: 2021-10-11 | Disposition: A | Discharge status: Home / Self Care | Payer: 59 | Source: Ambulatory Visit | Attending: Ophthalmology | Admitting: Ophthalmology

## 2021-10-11 ENCOUNTER — Encounter (HOSPITAL_COMMUNITY)
Admission: RE | Disposition: A | Discharge status: Home / Self Care | Payer: Self-pay | Source: Ambulatory Visit | Attending: Ophthalmology

## 2021-10-11 DIAGNOSIS — H43391 Other vitreous opacities, right eye: Secondary | ICD-10-CM

## 2021-10-11 DIAGNOSIS — M1990 Unspecified osteoarthritis, unspecified site: Secondary | ICD-10-CM | POA: Insufficient documentation

## 2021-10-11 DIAGNOSIS — I10 Essential (primary) hypertension: Secondary | ICD-10-CM | POA: Insufficient documentation

## 2021-10-11 DIAGNOSIS — I2510 Atherosclerotic heart disease of native coronary artery without angina pectoris: Secondary | ICD-10-CM | POA: Insufficient documentation

## 2021-10-11 DIAGNOSIS — I252 Old myocardial infarction: Secondary | ICD-10-CM | POA: Diagnosis not present

## 2021-10-11 DIAGNOSIS — Z955 Presence of coronary angioplasty implant and graft: Secondary | ICD-10-CM | POA: Insufficient documentation

## 2021-10-11 HISTORY — PX: PHOTOCOAGULATION WITH LASER: SHX6027

## 2021-10-11 HISTORY — PX: PARS PLANA VITRECTOMY: SHX2166

## 2021-10-11 LAB — CBC
HCT: 45.1 % (ref 39.0–52.0)
Hemoglobin: 15.6 g/dL (ref 13.0–17.0)
MCH: 32.5 pg (ref 26.0–34.0)
MCHC: 34.6 g/dL (ref 30.0–36.0)
MCV: 94 fL (ref 80.0–100.0)
Platelets: 200 10*3/uL (ref 150–400)
RBC: 4.8 MIL/uL (ref 4.22–5.81)
RDW: 12.5 % (ref 11.5–15.5)
WBC: 4 10*3/uL (ref 4.0–10.5)
nRBC: 0 % (ref 0.0–0.2)

## 2021-10-11 LAB — BASIC METABOLIC PANEL
Anion gap: 7 (ref 5–15)
BUN: 13 mg/dL (ref 8–23)
CO2: 23 mmol/L (ref 22–32)
Calcium: 8.9 mg/dL (ref 8.9–10.3)
Chloride: 104 mmol/L (ref 98–111)
Creatinine, Ser: 0.91 mg/dL (ref 0.61–1.24)
GFR, Estimated: >60 mL/min (ref >60)
Glucose, Bld: 95 mg/dL (ref 70–99)
Potassium: 3.6 mmol/L (ref 3.5–5.1)
Sodium: 134 mmol/L — ABNORMAL LOW (ref 135–145)

## 2021-10-11 SURGERY — PARS PLANA VITRECTOMY WITH 25 GAUGE
Anesthesia: General | Site: Eye | Laterality: Right

## 2021-10-11 MED ORDER — LIDOCAINE 2% (20 MG/ML) 5 ML SYRINGE
INTRAMUSCULAR | Status: AC
  Filled 2021-10-11: qty 10

## 2021-10-11 MED ORDER — FENTANYL CITRATE (PF) 250 MCG/5ML IJ SOLN
INTRAMUSCULAR | Status: DC | PRN
  Administered 2021-10-11: 50 ug via INTRAVENOUS
  Administered 2021-10-11: 100 ug via INTRAVENOUS

## 2021-10-11 MED ORDER — TRIAMCINOLONE ACETONIDE 40 MG/ML IJ SUSP
INTRAMUSCULAR | Status: DC | PRN
  Administered 2021-10-11: .8 mL

## 2021-10-11 MED ORDER — FENTANYL CITRATE (PF) 100 MCG/2ML IJ SOLN
25.0000 ug | INTRAMUSCULAR | Status: DC | PRN
  Administered 2021-10-11: 14:00:00 50 ug via INTRAVENOUS

## 2021-10-11 MED ORDER — ATROPINE SULFATE 1 % OP SOLN
1.0000 [drp] | OPHTHALMIC | Status: AC | PRN
  Administered 2021-10-11 (×3): 1 [drp] via OPHTHALMIC
  Filled 2021-10-11: qty 5

## 2021-10-11 MED ORDER — POLYMYXIN B SULFATE 500000 UNITS IJ SOLR
INTRAMUSCULAR | Status: AC
  Filled 2021-10-11: qty 500000

## 2021-10-11 MED ORDER — SODIUM CHLORIDE (PF) 0.9 % IJ SOLN
INTRAMUSCULAR | Status: AC
  Filled 2021-10-11: qty 10

## 2021-10-11 MED ORDER — FENTANYL CITRATE (PF) 100 MCG/2ML IJ SOLN
INTRAMUSCULAR | Status: AC
  Filled 2021-10-11: qty 2

## 2021-10-11 MED ORDER — MIDAZOLAM HCL 2 MG/2ML IJ SOLN
INTRAMUSCULAR | Status: DC | PRN
  Administered 2021-10-11: 2 mg via INTRAVENOUS

## 2021-10-11 MED ORDER — CEFAZOLIN SODIUM 1 G IJ SOLR
INTRAMUSCULAR | Status: AC
  Filled 2021-10-11: qty 20

## 2021-10-11 MED ORDER — ACETAMINOPHEN 10 MG/ML IV SOLN: 1000.0000 mg | Freq: Once | INTRAVENOUS | Status: DC | PRN

## 2021-10-11 MED ORDER — CARBACHOL 0.01 % IO SOLN
INTRAOCULAR | Status: AC
  Filled 2021-10-11: qty 1.5

## 2021-10-11 MED ORDER — GATIFLOXACIN 0.5 % OP SOLN
OPHTHALMIC | Status: AC
  Filled 2021-10-11: qty 2.5

## 2021-10-11 MED ORDER — EPINEPHRINE PF 1 MG/ML IJ SOLN
INTRAMUSCULAR | Status: AC
  Filled 2021-10-11: qty 1

## 2021-10-11 MED ORDER — ONDANSETRON HCL 4 MG/2ML IJ SOLN
INTRAMUSCULAR | Status: DC | PRN
  Administered 2021-10-11: 4 mg via INTRAVENOUS

## 2021-10-11 MED ORDER — PROPOFOL 10 MG/ML IV BOLUS
INTRAVENOUS | Status: AC
  Filled 2021-10-11: qty 20

## 2021-10-11 MED ORDER — GATIFLOXACIN 0.5 % OP SOLN OPTIME - NO CHARGE
OPHTHALMIC | Status: DC | PRN
  Administered 2021-10-11: 14:00:00 1 [drp] via OPHTHALMIC

## 2021-10-11 MED ORDER — DEXAMETHASONE SODIUM PHOSPHATE 10 MG/ML IJ SOLN
INTRAMUSCULAR | Status: DC | PRN
  Administered 2021-10-11: 10 mg via INTRAVENOUS

## 2021-10-11 MED ORDER — DORZOLAMIDE HCL-TIMOLOL MAL 2-0.5 % OP SOLN
OPHTHALMIC | Status: AC
  Filled 2021-10-11: qty 10

## 2021-10-11 MED ORDER — ATROPINE SULFATE 1 % OP SOLN
OPHTHALMIC | Status: AC
  Filled 2021-10-11: qty 5

## 2021-10-11 MED ORDER — EPINEPHRINE PF 1 MG/ML IJ SOLN
INTRAOCULAR | Status: DC | PRN
  Administered 2021-10-11: 13:00:00 500.3 mL

## 2021-10-11 MED ORDER — BSS PLUS IO SOLN
INTRAOCULAR | Status: AC
  Filled 2021-10-11: qty 500

## 2021-10-11 MED ORDER — DEXAMETHASONE SODIUM PHOSPHATE 10 MG/ML IJ SOLN
INTRAMUSCULAR | Status: AC
  Filled 2021-10-11: qty 1

## 2021-10-11 MED ORDER — SODIUM CHLORIDE 0.9 % IV SOLN: INTRAVENOUS | Status: DC

## 2021-10-11 MED ORDER — BACITRACIN-POLYMYXIN B 500-10000 UNIT/GM OP OINT
TOPICAL_OINTMENT | OPHTHALMIC | Status: DC | PRN
  Administered 2021-10-11: 1 via OPHTHALMIC

## 2021-10-11 MED ORDER — PHENYLEPHRINE HCL 10 % OP SOLN
1.0000 [drp] | OPHTHALMIC | Status: AC | PRN
  Administered 2021-10-11 (×3): 1 [drp] via OPHTHALMIC
  Filled 2021-10-11: qty 5

## 2021-10-11 MED ORDER — LIDOCAINE 2% (20 MG/ML) 5 ML SYRINGE
INTRAMUSCULAR | Status: DC | PRN
  Administered 2021-10-11: 60 mg via INTRAVENOUS

## 2021-10-11 MED ORDER — NA CHONDROIT SULF-NA HYALURON 40-30 MG/ML IO SOSY
INTRAOCULAR | Status: DC | PRN
  Administered 2021-10-11: 0.5 mL via INTRAOCULAR

## 2021-10-11 MED ORDER — CEFTAZIDIME 1 G IJ SOLR
INTRAMUSCULAR | Status: AC
  Filled 2021-10-11: qty 1

## 2021-10-11 MED ORDER — PROMETHAZINE HCL 25 MG/ML IJ SOLN: 6.2500 mg | INTRAMUSCULAR | Status: DC | PRN

## 2021-10-11 MED ORDER — SUGAMMADEX SODIUM 200 MG/2ML IV SOLN
INTRAVENOUS | Status: DC | PRN
  Administered 2021-10-11: 334 mg via INTRAVENOUS

## 2021-10-11 MED ORDER — STERILE WATER FOR INJECTION IJ SOLN
INTRAMUSCULAR | Status: DC | PRN
  Administered 2021-10-11: 14:00:00 .5 mL

## 2021-10-11 MED ORDER — STERILE WATER FOR INJECTION IJ SOLN
INTRAMUSCULAR | Status: AC
  Filled 2021-10-11: qty 10

## 2021-10-11 MED ORDER — BRIMONIDINE TARTRATE 0.2 % OP SOLN
OPHTHALMIC | Status: AC
  Filled 2021-10-11: qty 5

## 2021-10-11 MED ORDER — DEXAMETHASONE SODIUM PHOSPHATE 10 MG/ML IJ SOLN
INTRAMUSCULAR | Status: AC
  Filled 2021-10-11: qty 2

## 2021-10-11 MED ORDER — MIDAZOLAM HCL 2 MG/2ML IJ SOLN
INTRAMUSCULAR | Status: AC
  Filled 2021-10-11: qty 2

## 2021-10-11 MED ORDER — PREDNISOLONE ACETATE 1 % OP SUSP
OPHTHALMIC | Status: AC
  Filled 2021-10-11: qty 5

## 2021-10-11 MED ORDER — DORZOLAMIDE HCL-TIMOLOL MAL 2-0.5 % OP SOLN
OPHTHALMIC | Status: DC | PRN
  Administered 2021-10-11: 1 [drp] via OPHTHALMIC

## 2021-10-11 MED ORDER — PROPOFOL 10 MG/ML IV BOLUS
INTRAVENOUS | Status: DC | PRN
  Administered 2021-10-11: 180 mg via INTRAVENOUS

## 2021-10-11 MED ORDER — BSS IO SOLN
INTRAOCULAR | Status: AC
  Filled 2021-10-11: qty 15

## 2021-10-11 MED ORDER — BRIMONIDINE TARTRATE 0.2 % OP SOLN
OPHTHALMIC | Status: DC | PRN
  Administered 2021-10-11: 1 [drp] via OPHTHALMIC

## 2021-10-11 MED ORDER — DEXMEDETOMIDINE (PRECEDEX) IN NS 20 MCG/5ML (4 MCG/ML) IV SYRINGE
PREFILLED_SYRINGE | INTRAVENOUS | Status: DC | PRN
  Administered 2021-10-11: 20 ug via INTRAVENOUS

## 2021-10-11 MED ORDER — ROCURONIUM BROMIDE 10 MG/ML (PF) SYRINGE
PREFILLED_SYRINGE | INTRAVENOUS | Status: AC
  Filled 2021-10-11: qty 20

## 2021-10-11 MED ORDER — PREDNISOLONE ACETATE 1 % OP SUSP
OPHTHALMIC | Status: DC | PRN
  Administered 2021-10-11: 1 [drp] via OPHTHALMIC

## 2021-10-11 MED ORDER — NA CHONDROIT SULF-NA HYALURON 40-30 MG/ML IO SOSY
INTRAOCULAR | Status: AC
  Filled 2021-10-11: qty 0.5

## 2021-10-11 MED ORDER — BACITRACIN-POLYMYXIN B 500-10000 UNIT/GM OP OINT
TOPICAL_OINTMENT | OPHTHALMIC | Status: AC
  Filled 2021-10-11: qty 3.5

## 2021-10-11 MED ORDER — ATROPINE SULFATE 1 % OP SOLN
OPHTHALMIC | Status: DC | PRN
  Administered 2021-10-11: 1 [drp] via OPHTHALMIC

## 2021-10-11 MED ORDER — TRIAMCINOLONE ACETONIDE 40 MG/ML IJ SUSP
INTRAMUSCULAR | Status: AC
  Filled 2021-10-11: qty 5

## 2021-10-11 MED ORDER — EPHEDRINE SULFATE-NACL 50-0.9 MG/10ML-% IV SOSY
PREFILLED_SYRINGE | INTRAVENOUS | Status: DC | PRN
  Administered 2021-10-11 (×2): 10 mg via INTRAVENOUS

## 2021-10-11 MED ORDER — FENTANYL CITRATE (PF) 250 MCG/5ML IJ SOLN
INTRAMUSCULAR | Status: AC
  Filled 2021-10-11: qty 5

## 2021-10-11 MED ORDER — PROPARACAINE HCL 0.5 % OP SOLN
1.0000 [drp] | OPHTHALMIC | Status: AC | PRN
  Administered 2021-10-11 (×3): 1 [drp] via OPHTHALMIC
  Filled 2021-10-11: qty 15

## 2021-10-11 MED ORDER — TROPICAMIDE 1 % OP SOLN
1.0000 [drp] | OPHTHALMIC | Status: AC | PRN
  Administered 2021-10-11 (×3): 1 [drp] via OPHTHALMIC
  Filled 2021-10-11: qty 15

## 2021-10-11 MED ORDER — STERILE WATER FOR IRRIGATION IR SOLN
Status: DC | PRN
  Administered 2021-10-11: 1000 mL

## 2021-10-11 MED ORDER — ROCURONIUM BROMIDE 10 MG/ML (PF) SYRINGE
PREFILLED_SYRINGE | INTRAVENOUS | Status: DC | PRN
  Administered 2021-10-11 (×2): 20 mg via INTRAVENOUS
  Administered 2021-10-11: 50 mg via INTRAVENOUS

## 2021-10-11 MED ORDER — CHLORHEXIDINE GLUCONATE 0.12 % MT SOLN
15.0000 mL | Freq: Once | OROMUCOSAL | Status: AC
  Administered 2021-10-11: 09:00:00 15 mL via OROMUCOSAL
  Filled 2021-10-11: qty 15

## 2021-10-11 MED ORDER — ORAL CARE MOUTH RINSE: 15.0000 mL | Freq: Once | OROMUCOSAL | Status: AC

## 2021-10-11 SURGICAL SUPPLY — 36 items
BAND WRIST GAS GREEN (MISCELLANEOUS) IMPLANT
BNDG EYE OVAL (GAUZE/BANDAGES/DRESSINGS) ×2 IMPLANT
CANNULA DUALBORE 25G (CANNULA) ×3 IMPLANT
CANNULA FLEX TIP 25G (CANNULA) ×3 IMPLANT
CLOSURE STERI-STRIP 1/2X4 (GAUZE/BANDAGES/DRESSINGS) ×1
CLSR STERI-STRIP ANTIMIC 1/2X4 (GAUZE/BANDAGES/DRESSINGS) ×2 IMPLANT
DRAPE INCISE 51X51 W/FILM STRL (DRAPES) ×3 IMPLANT
DRAPE MICROSCOPE LEICA 46X105 (MISCELLANEOUS) ×3 IMPLANT
DRAPE OPHTHALMIC 77X100 STRL (CUSTOM PROCEDURE TRAY) ×3 IMPLANT
GAS WRIST BAND GREEN (MISCELLANEOUS)
GLOVE SURG MICRO LTX SZ7 (GLOVE) ×6 IMPLANT
GOWN STRL REUS W/ TWL LRG LVL3 (GOWN DISPOSABLE) ×2 IMPLANT
GOWN STRL REUS W/ TWL XL LVL3 (GOWN DISPOSABLE) ×1 IMPLANT
GOWN STRL REUS W/TWL LRG LVL3 (GOWN DISPOSABLE) ×4
GOWN STRL REUS W/TWL XL LVL3 (GOWN DISPOSABLE) ×2
KIT BASIN OR (CUSTOM PROCEDURE TRAY) ×3 IMPLANT
NDL 18GX1X1/2 (RX/OR ONLY) (NEEDLE) ×3 IMPLANT
NDL 25GX 5/8IN NON SAFETY (NEEDLE) ×3 IMPLANT
NDL FILTER BLUNT 18X1 1/2 (NEEDLE) ×1 IMPLANT
NEEDLE 18GX1X1/2 (RX/OR ONLY) (NEEDLE) ×9 IMPLANT
NEEDLE 25GX 5/8IN NON SAFETY (NEEDLE) ×3 IMPLANT
NEEDLE FILTER BLUNT 18X 1/2SAF (NEEDLE) ×2
NEEDLE FILTER BLUNT 18X1 1/2 (NEEDLE) ×1 IMPLANT
PACK VITRECTOMY CUSTOM (CUSTOM PROCEDURE TRAY) ×3 IMPLANT
PAD ARMBOARD 7.5X6 YLW CONV (MISCELLANEOUS) ×6 IMPLANT
PAK PIK VITRECTOMY CVS 25GA (OPHTHALMIC) ×3 IMPLANT
PROBE ENDO DIATHERMY 25G (MISCELLANEOUS) IMPLANT
PROBE LASER ILLUM FLEX CVD 25G (OPHTHALMIC) ×2 IMPLANT
SHIELD EYE LENSE ONLY DISP (GAUZE/BANDAGES/DRESSINGS) ×2 IMPLANT
SUT VICRYL 7 0 TG140 8 (SUTURE) ×3 IMPLANT
SYR 10ML LL (SYRINGE) ×3 IMPLANT
SYR 20ML LL LF (SYRINGE) ×3 IMPLANT
SYR 5ML LL (SYRINGE) ×3 IMPLANT
SYR TB 1ML LUER SLIP (SYRINGE) ×6 IMPLANT
TOWEL GREEN STERILE FF (TOWEL DISPOSABLE) ×3 IMPLANT
WATER STERILE IRR 1000ML POUR (IV SOLUTION) ×3 IMPLANT

## 2021-10-11 NOTE — Op Note (Signed)
Date of procedure: 12.29.2022   Surgeon: [name], M.D., Ph.D    Assistant: none   Pre-operative Diagnosis:  1. Visually significant vitreous opacities, right eye   Post-operative diagnosis:  1. Visually significant vitreous opacities, right eye   Anesthesia: General   Procedure: 1)     25 gauge pars plana vitrectomy w/ endolaser, Right Eye   Complications: none Estimated blood loss: minimal Specimens: none   Brief history:  Pt has a history of visually significant vitreous opacities of the right eye, which were affecting his activities of daily living. The risks, benefits, and alternatives were explained to the patient, including pain, bleeding, infection, loss of vision, double vision, droopy eyelids, and need for more surgeries.  Informed consent was obtained from the patient and placed in the chart.     Procedure: The patient was brought to the preoperative holding area where the correct eye was confirmed and marked. The patient was then brought to the operating room where general anesthesia was induced by the Anesthesia team. A secondary time-out was performed to identify the correct patient, eye, procedure, and any allergies. The eye was prepped and draped in the usual sterile ophthalmic fashion followed by placement of a lid speculum.    A 25 gauge trocar was placed in the inferotemporal quadrant 3.5 mm posterior to the limbus in a beveled fashion. A 4 mm infusion cannula was placed through this trocar, and the infusion cannula was confirmed in the vitreous cavity with no incarceration of retina or choroid prior to turning it on. Two additional 25 gauge trocars were placed in the superonasal and superotemporal quadrants in a similar beveled fashion. The light pipe and cutter were introduced into the eye and direct vitrectomy was performed to remove the anterior hyaloid.   At this time, a standard three-port pars plana vitrectomy was performed using the light pipe, the cutter,  and the BIOM viewing system. A thorough core and peripheral vitreous dissection was performed. Kenalog was used to highlight the vitreous. Care was taken to insure the posterior vitreous was detached. Peripheral vitrectomy was completed with care using scleral depression. Of note, there were no peripheral retinal breaks or tears. Next, endolaser was used to place 360 prophylactic laser in the periphery.   The trocars were then removed and sutured with 7-0 vicryl in an interrupted fashion. Subconjunctival injections of antibiotic and Kenalog were administered. The lid speculum and drapes were removed. Drops of an antibiotic and steroid were given. The eye was patched and shielded. The patient tolerated the procedure well without any intraoperative or immediate postoperative complications. The patient was taken to the recovery room in good condition. The patient was instructed to follow-up with Dr. Coralyn Pear in clinic on the following morning.

## 2021-10-11 NOTE — Brief Op Note (Signed)
10/11/2021  2:02 PM  PATIENT:  [name]  60 y.o. male  PRE-OPERATIVE DIAGNOSIS:  visually significant vitreous opacities  POST-OPERATIVE DIAGNOSIS:  visually significant vitreous opacities  PROCEDURE:  Procedure(s): TWENTY-FIVE GAUGE PARS PLANA VITRECTOMY (Right) PHOTOCOAGULATION WITH LASER (Right)  SURGEON:  Surgeon(s) and Role:    [name], MD - Primary  ASSISTANTS: none   ANESTHESIA:   general  EBL:  minimal   BLOOD ADMINISTERED:none  DRAINS: none   LOCAL MEDICATIONS USED:  NONE  SPECIMEN:  No Specimen  DISPOSITION OF SPECIMEN:  N/A  COUNTS:  YES  TOURNIQUET:  * No tourniquets in log *  DICTATION: .Note written in EPIC  PLAN OF CARE: Discharge to home after PACU  PATIENT DISPOSITION:  PACU - hemodynamically stable.   Delay start of Pharmacological VTE agent (>24hrs) due to surgical blood loss or risk of bleeding: no

## 2021-10-11 NOTE — Anesthesia Procedure Notes (Signed)
Procedure Name: Intubation Date/Time: 10/11/2021 12:05 PM Performed by: Minerva Ends, CRNA Pre-anesthesia Checklist: Patient identified, Emergency Drugs available, Suction available and Patient being monitored Patient Re-evaluated:Patient Re-evaluated prior to induction Oxygen Delivery Method: Circle system utilized Preoxygenation: Pre-oxygenation with 100% oxygen Induction Type: IV induction Ventilation: Mask ventilation without difficulty Laryngoscope Size: Mac and 3 Grade View: Grade I Tube type: Oral Tube size: 7.5 mm Number of attempts: 1 Airway Equipment and Method: Stylet and Oral airway Placement Confirmation: ETT inserted through vocal cords under direct vision, positive ETCO2 and breath sounds checked- equal and bilateral Secured at: 23 cm Tube secured with: Tape Dental Injury: Teeth and Oropharynx as per pre-operative assessment

## 2021-10-11 NOTE — Interval H&P Note (Signed)
History and Physical Interval Note:  10/11/2021 11:49 AM  Andrew Mitchell  has presented today for surgery, with the diagnosis of visually significant vitreous opacities.  The various methods of treatment have been discussed with the patient and family. After consideration of risks, benefits and other options for treatment, the patient has consented to  Procedure(s): PARS PLANA VITRECTOMY WITH 25 GAUGE (Right) as a surgical intervention.  The patient's history has been reviewed, patient examined, no change in status, stable for surgery.  I have reviewed the patient's chart and labs.  Questions were answered to the patient's satisfaction.     [name]

## 2021-10-11 NOTE — Anesthesia Preprocedure Evaluation (Signed)
Anesthesia Evaluation  Patient identified by MRN, date of birth, ID band Patient awake    Reviewed: Allergy & Precautions, NPO status , Patient's Chart, lab work & pertinent test results  Airway Mallampati: II  TM Distance: >3 FB Neck ROM: Full    Dental no notable dental hx.    Pulmonary neg pulmonary ROS,    Pulmonary exam normal        Cardiovascular hypertension, Pt. on medications and Pt. on home beta blockers + CAD, + Past MI and + Cardiac Stents   Rhythm:Regular Rate:Normal     Neuro/Psych negative psych ROS   GI/Hepatic negative GI ROS, Neg liver ROS,   Endo/Other  negative endocrine ROS  Renal/GU negative Renal ROS     Musculoskeletal  (+) Arthritis , Osteoarthritis,    Abdominal Normal abdominal exam  (+)   Peds  Hematology negative hematology ROS (+)   Anesthesia Other Findings   Reproductive/Obstetrics                             Anesthesia Physical Anesthesia Plan  ASA: 3  Anesthesia Plan: General   Post-op Pain Management:    Induction: Intravenous  PONV Risk Score and Plan: 2 and Ondansetron, Dexamethasone and Treatment may vary due to age or medical condition  Airway Management Planned: Mask and LMA  Additional Equipment: None  Intra-op Plan:   Post-operative Plan: Extubation in OR  Informed Consent: I have reviewed the patients History and Physical, chart, labs and discussed the procedure including the risks, benefits and alternatives for the proposed anesthesia with the patient or authorized representative who has indicated his/her understanding and acceptance.     Dental advisory given  Plan Discussed with: CRNA  Anesthesia Plan Comments: (Lab Results      Component                Value               Date                      WBC                      4.0                 10/11/2021                HGB                      15.6                 10/11/2021                HCT                      45.1                10/11/2021                MCV                      94.0                10/11/2021                PLT  200                 10/11/2021           Lab Results      Component                Value               Date                      NA                       134 (L)             10/11/2021                K                        3.6                 10/11/2021                CO2                      23                  10/11/2021                GLUCOSE                  95                  10/11/2021                BUN                      13                  10/11/2021                CREATININE               0.91                10/11/2021                CALCIUM                  8.9                 10/11/2021                GFRNONAA                 >60                 10/11/2021            ECHO 12/19: - Left ventricle: The cavity size was normal. Systolic function was  normal. The estimated ejection fraction was in the range of 50%  to 55%. Probable hypokinesis of the inferolateral and  anterolateral walls. Suboptimal image quality despite the use of  Definity. Doppler parameters are consistent with abnormal left  ventricular relaxation (grade 1 diastolic dysfunction).  - Aortic valve: Transvalvular velocity was within the normal range.  There was no stenosis. There was no regurgitation.  - Aorta: Aortic root dimension: 42 mm (ED). Mid-ascending aortic  diameter: 39  mm (ED).  - Aortic root: The aortic root was mildly dilated.  - Ascending aorta: The ascending aorta was mildly dilated.  - Mitral valve: Transvalvular velocity was within the normal range.  There was no evidence for stenosis. There was no regurgitation.  - Left atrium: The atrium was normal in size.  - Right ventricle: The cavity size was normal. Wall thickness was  normal. Systolic function was normal.  - Right atrium:  The atrium was normal in size.  - Tricuspid valve: There was no regurgitation.  - Inferior vena cava: The vessel was normal in size. The  respirophasic diameter changes were in the normal range (>= 50%),  consistent with normal central venous pressure.  - Pericardium, extracardiac: There was no pericardial effusion. )        Anesthesia Quick Evaluation

## 2021-10-11 NOTE — Transfer of Care (Signed)
Immediate Anesthesia Transfer of Care Note  Patient: Andrew Mitchell  Procedure(s) Performed: TWENTY-FIVE GAUGE PARS PLANA VITRECTOMY (Right: Eye) PHOTOCOAGULATION WITH LASER (Right: Eye)  Patient Location: PACU  Anesthesia Type:General  Level of Consciousness: awake, alert  and oriented  Airway & Oxygen Therapy: Patient Spontanous Breathing  Post-op Assessment: Report given to RN and Post -op Vital signs reviewed and stable  Post vital signs: Reviewed and stable  Last Vitals:  Vitals Value Taken Time  BP 134/75 10/11/21 1400  Temp    Pulse 73 10/11/21 1401  Resp 17 10/11/21 1401  SpO2 98 % 10/11/21 1401  Vitals shown include unvalidated device data.  Last Pain:  Vitals:   10/11/21 0923  TempSrc:   PainSc: 0-No pain         Complications: No notable events documented.

## 2021-10-11 NOTE — Discharge Instructions (Addendum)
POSTOPERATIVE INSTRUCTIONS  Your doctor has performed vitreoretinal surgery on you at [facility].  - Keep eye patched and shielded until seen by Dr. Zamora 8 AM tomorrow in clinic - Do not use drops until return - Sleep with head elevated 30-45 degrees   - No strenuous bending, stooping or lifting.  - You may not drive until further notice.  - Tylenol or any other over-the-counter pain reliever can be used according to your doctor. If more pain medicine is required, your doctor will have a prescription for you.  - You may read, go up and down stairs, and watch television.     Brian Zamora, M.D., Ph.D.  

## 2021-10-11 NOTE — Anesthesia Postprocedure Evaluation (Signed)
Anesthesia Post Note  Patient: Andrew Mitchell  Procedure(s) Performed: TWENTY-FIVE GAUGE PARS PLANA VITRECTOMY (Right: Eye) PHOTOCOAGULATION WITH LASER (Right: Eye)     Patient location during evaluation: PACU Anesthesia Type: General Level of consciousness: awake and alert Pain management: pain level controlled Vital Signs Assessment: post-procedure vital signs reviewed and stable Respiratory status: spontaneous breathing, nonlabored ventilation, respiratory function stable and patient connected to nasal cannula oxygen Cardiovascular status: blood pressure returned to baseline and stable Postop Assessment: no apparent nausea or vomiting Anesthetic complications: no   No notable events documented.  Last Vitals:  Vitals:   10/11/21 1415 10/11/21 1430  BP: (!) 122/58 (!) 129/102  Pulse: 61 60  Resp: 17 14  Temp:  36.6 C  SpO2: 96% 98%    Last Pain:  Vitals:   10/11/21 1430  TempSrc:   PainSc: 2                  Jeremih Dearmas P Anjalina Bergevin

## 2021-10-12 ENCOUNTER — Encounter (INDEPENDENT_AMBULATORY_CARE_PROVIDER_SITE_OTHER): Payer: Self-pay | Admitting: Ophthalmology

## 2021-10-12 ENCOUNTER — Ambulatory Visit (INDEPENDENT_AMBULATORY_CARE_PROVIDER_SITE_OTHER): Payer: 59 | Admitting: Ophthalmology

## 2021-10-12 DIAGNOSIS — H35351 Cystoid macular degeneration, right eye: Secondary | ICD-10-CM

## 2021-10-12 DIAGNOSIS — Z961 Presence of intraocular lens: Secondary | ICD-10-CM

## 2021-10-12 DIAGNOSIS — H43393 Other vitreous opacities, bilateral: Secondary | ICD-10-CM

## 2021-10-12 DIAGNOSIS — I10 Essential (primary) hypertension: Secondary | ICD-10-CM

## 2021-10-12 DIAGNOSIS — H35033 Hypertensive retinopathy, bilateral: Secondary | ICD-10-CM

## 2021-10-15 NOTE — Progress Notes (Addendum)
[facility] Note  10/18/2021     CHIEF COMPLAINT Patient presents for Retina Follow Up   HISTORY OF PRESENT ILLNESS: Andrew Mitchell is a 61 y.o. male who presents to the clinic today for:  HPI     Retina Follow Up   Patient presents with  Other.  In both eyes.  This started months ago.  Severity is moderate.  Duration of 6 days.  Since onset it is stable.  I, the attending physician,  performed the HPI with the patient and updated documentation appropriately.        Comments   61 y/o male pt here for 6 day POV.  S/p PPV/EL OD 12.29.22, and s/p PPV/EL OS 11.17.22.  No change in [place] OU noticed.  Denies pain, FOL, new floaters.  Pred QID OD Zymaxid QID OD Atropine BID OD Brimonidine BID OD PSO ung QID OD      Last edited by [name], MD on 10/18/2021  1:00 PM.    Pt states vision is getting better, he sees occasional black dot floaters in his vision  Referring physician: [name], MD [address] [place],  [place] [address]  HISTORICAL INFORMATION:  Selected notes from the MEDICAL RECORD NUMBER Referred by Dr. [name] for eval of CME   CURRENT MEDICATIONS: Current Outpatient Medications (Ophthalmic Drugs)  Medication Sig   LOTEMAX SM 0.38 % GEL Place 1 drop into the right eye 2 (two) times daily.   Bromfenac Sodium (PROLENSA) 0.07 % SOLN INSTILL 1 DROP IN RIGHT EYE FOUR TIMES DAILY (Patient not taking: Reported on 10/18/2021)   loteprednol (LOTEMAX) 0.5 % ophthalmic suspension Place 1 drop into the right eye 3 (three) times daily. (Patient not taking: Reported on 10/18/2021)   prednisoLONE acetate (PRED FORTE) 1 % ophthalmic suspension Place 1 drop into the left eye 6 (six) times daily. (Patient taking differently: Place 1 drop into the left eye daily.)   No current facility-administered medications for this visit. (Ophthalmic Drugs)   Current Outpatient Medications (Other)  Medication Sig   carbidopa-levodopa (SINEMET  IR) 25-100 MG per tablet Take 0.5 tablets by mouth at bedtime. Take 1/2 to 1 tablet by mouth daily before bedtime.   clopidogrel (PLAVIX) 75 MG tablet TAKE 1 TABLET BY MOUTH EVERY DAY   ezetimibe (ZETIA) 10 MG tablet TAKE 1 TABLET BY MOUTH EVERY DAY   famotidine (PEPCID) 40 MG tablet Take 40 mg by mouth at bedtime.   metoprolol tartrate (LOPRESSOR) 25 MG tablet Take 1 tablet (25 mg total) by mouth 2 (two) times daily. Please keep upcoming appt in March 2023 with Tamala Julian before anymore refills. Thank you   Multiple Vitamin (MULTIVITAMIN WITH MINERALS) TABS tablet Take 1 tablet by mouth in the morning. Centrum Multivitamin   nitroGLYCERIN (NITROSTAT) 0.4 MG SL tablet Place 1 tablet (0.4 mg total) under the tongue every 5 (five) minutes x 3 doses as needed for chest pain.   rosuvastatin (CRESTOR) 5 MG tablet TAKE 1 TABLET BY MOUTH EVERY DAY (Patient taking differently: Take 5 mg by mouth at bedtime.)   zolpidem (AMBIEN) 5 MG tablet Take 10 mg by mouth at bedtime as needed for sleep.   No current facility-administered medications for this visit. (Other)   REVIEW OF SYSTEMS: ROS   Positive for: Cardiovascular, Eyes, Psychiatric Negative for: Constitutional, Gastrointestinal, Neurological, Skin, Genitourinary, Musculoskeletal, HENT, Endocrine, Respiratory, Allergic/Imm, Heme/Lymph Last edited by [name], COA on 10/18/2021  7:59 AM.  ALLERGIES Allergies  Allergen Reactions   Shellfish Allergy Anaphylaxis    Just to shrimp   PAST MEDICAL HISTORY Past Medical History:  Diagnosis Date   Back pain    CAD (coronary artery disease)    a. 09/2018 Inf STEMI/PCI: LM nl, LAD min irregs, D1/2 min irregs, LCX 100ost/p thrombotic (2.75x35 Orsiro DES), RCA 40d, RPAV 90.   Carpal tunnel syndrome    DDD (degenerative disc disease)    Diastolic dysfunction    a. 09/2018 Echo: EF 50-55%, prob inflat and antlat HK, Gr1 DD. Mildlly dil Ao root (50m). Nl RV fxn.   Hyperlipemia    Hypertension     Hypertensive retinopathy    OU   Mildly Dilated aortic root ([place])    a. 09/2018 Echo: 73mm.   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 Peterson [facility]   COLONOSCOPY     CORONARY ANGIOGRAPHY N/A 10/08/2018   Procedure: CORONARY ANGIOGRAPHY;  Surgeon: Sherren Mocha, MD;  Location: [facility];  Service: Cardiovascular;  Laterality: N/A;   CORONARY STENT INTERVENTION N/A 10/08/2018   Procedure: CORONARY STENT INTERVENTION;  Surgeon: Sherren Mocha, MD;  Location: [facility];  Service: Cardiovascular;  Laterality: N/A;   CORONARY/GRAFT ACUTE MI REVASCULARIZATION N/A 10/08/2018   Procedure: Coronary/Graft Acute MI Revascularization;  Surgeon: Sherren Mocha, MD;  Location: [facility];  Service: Cardiovascular;  Laterality: N/A;   EYE SURGERY Bilateral 2019   Cat Sx - Dr. [name]   KNEE ARTHROSCOPY     rightx2   PARS PLANA VITRECTOMY Left 08/30/2021   Procedure: PARS PLANA VITRECTOMY WITH 25 GAUGE;  Surgeon: [name], MD;  Location: [facility];  Service: Ophthalmology;  Laterality: Left;   PARS PLANA VITRECTOMY Right 10/11/2021   Procedure: [place] VITRECTOMY;  Surgeon: [name], MD;  Location: [facility];  Service: Ophthalmology;  Laterality: Right;   PHOTOCOAGULATION WITH LASER Left 08/30/2021   Procedure: PHOTOCOAGULATION WITH LASER;  Surgeon: [name], MD;  Location: [facility];  Service: Ophthalmology;  Laterality: Left;   PHOTOCOAGULATION WITH LASER Right 10/11/2021   Procedure: PHOTOCOAGULATION WITH LASER;  Surgeon: [name], MD;  Location: [facility];  Service: Ophthalmology;  Laterality: Right;   RIB RESECTION  2003    thorasic outlet syndrome-rt   SHOULDER ARTHROSCOPY     rightx2   TONSILLECTOMY     FAMILY HISTORY History reviewed. No pertinent family history.  SOCIAL HISTORY Social History   Tobacco Use   Smoking status: Never   Smokeless tobacco: Never  Vaping Use   Vaping Use: Never used  Substance Use Topics   Alcohol use: Yes    Comment: occ   Drug use: No       OPHTHALMIC EXAM:  Base Eye Exam     Visual Acuity (Snellen - Linear)       Right Left   Dist [place] 20/20 -2 20/20 -2         Tonometry (Tonopen, 8:08 AM)       Right Left   Pressure 16 14         Pupils       Dark Light Shape React APD  Right 5 5 Round Minimal None   Left 4 3 Round Brisk None  Pharm dil OD        Visual Fields (Counting fingers)       Left Right    Full Full         Extraocular Movement       Right Left    Full, Ortho Full, Ortho         Neuro/Psych     Oriented x3: Yes   Mood/Affect: Normal         Dilation     Right eye: 1.0% Mydriacyl, 2.5% Phenylephrine @ 8:08 AM           Slit Lamp and Fundus Exam     Slit Lamp Exam       Right Left   Lids/Lashes Dermatochalasis - upper lid, UL telangiectasia Dermatochalasis - upper lid   Conjunctiva/Sclera White and quiet, mild [place] nasally, sutures intact Subconjunctival hemorrhage - improved, 1+Injection, sutures intact, nasal pingeucula   Cornea Mild arcus, well healed temporal cataract wounds, trace PEE well healed temporal cataract wounds, trace PEE, mild tear film debris, mild arcus   Anterior Chamber Deep, 1+ cell and pigment Deep and quiet   Iris Round and dilated Round and dilated to 7.48mm   Lens MF PC IOL in perfect position with open PC MF PC IOL in perfect position with open PC   Anterior Vitreous Post vitrectomy, vitreous condensations and opacities gone post vitrectomy; clear; vitreous opacities gone         Fundus Exam       Right Left   Disc Pink and Sharp, Compact    C/D Ratio 0.5    Macula  Flat, good foveal reflex, mild RPE mottling and clumping    Vessels mild attenuation    Periphery Attached, No heme, No RT/RD, 360 peripheral laser changes             IMAGING AND PROCEDURES  Imaging and Procedures for 10/18/2021  OCT, Retina - OU - Both Eyes       Right Eye Quality was good. Central Foveal Thickness: 346. Progression has been stable. Findings include no SRF, no IRF, normal foveal contour.   Left Eye Quality was good. Central Foveal Thickness: 336. Progression has been stable. Findings include normal foveal contour, no IRF, no SRF.   Notes *Images captured and stored on drive  Diagnosis / Impression:  NFP; no IRF/SRF OU  Clinical management:  See below  Abbreviations: NFP - Normal foveal profile. CME - cystoid macular edema. PED - pigment epithelial detachment. IRF - intraretinal fluid. SRF - subretinal fluid. EZ - ellipsoid zone. ERM - epiretinal membrane. ORA - outer retinal atrophy. ORT - outer retinal tubulation. SRHM - subretinal hyper-reflective material. IRHM - intraretinal hyper-reflective material            ASSESSMENT/PLAN:    ICD-10-CM   1. Cystoid macular edema of right eye  H35.351 OCT, Retina - OU - Both Eyes    2. Vitreous syneresis of both eyes  H43.393     3. Vitreous floaters of both eyes  H43.393     4. Essential hypertension  I10     5. Hypertensive retinopathy of both eyes  H35.033     6. Pseudophakia, both eyes  Z96.1      1. CME OD  - initially diagnosed w/ CME OU by Dr. [name] on 08.16.21 and started on Durezol and BromSite TID OU  - s/p STK  OD #1, 09.30.22  - today BCVA 20/25 OD -- 1 wk POV s/p PPV w/ EL for vitreous opacities  - IOP 16 -- history of steroid response but okay today  - monitor   2,3. Vitreous syneresis / floaters OU  - prominent symptomatic floaters OU  - s/p YAG vitreolysis OD 3.4.2020 ([facility])  - POW4 s/p PPV/EL OS, 11.17.22 - tapered off all drops, BCVA 20/20             - POW1 s/p  PPV/EL OD, 12.29.22             - doing well   - BCVA OD 20/20             - IOP good at 16,14             - cont   PF 4x/day OD x2 wks, then dec to 3x/day until next f/u                         zymaxid QID OD -- stop on Saturday                          Atropine BID OD -- okay to stop                         Brimonidine BID OD                          PSO ung QID OD -- can reduced to qhs, prn OD             - eye shield when sleeping x1 more week             - post op instructions reviewed   - f/u 3 weeks -- POV -- DFE, OCT  4,5. Hypertensive retinopathy OU - discussed importance of tight BP control - monitor   6. Pseudophakia OU  - s/p CE/IOL OU 2019  - multifocal IOLs in perfect position, doing well  - monitor   Ophthalmic Meds Ordered this visit:  No orders of the defined types were placed in this encounter.     Return in about 3 weeks (around 11/08/2021) for vitreous condensations OU, DFE, OCT.  There are no Patient Instructions on file for this visit.  This document serves as a record of services personally performed by Gardiner Sleeper, MD, PhD. It was created on their behalf by San Jetty. Owens Shark, OA an ophthalmic technician. The creation of this record is the provider's dictation and/or activities during the visit.    Electronically signed by: San Jetty. Owens Shark, [place] 01.02.2023 1:04 PM   Gardiner Sleeper, M.D., Ph.D. Diseases & Surgery of the Retina and Vitreous [facility]  I have reviewed the above documentation for accuracy and completeness, and I agree with the above. Gardiner Sleeper, M.D., Ph.D. 10/18/21 1:04 PM   Abbreviations: M myopia (nearsighted); A astigmatism; H hyperopia (farsighted); P presbyopia; Mrx spectacle prescription;  CTL contact lenses; OD right eye; OS left eye; OU both eyes  XT exotropia; ET esotropia; PEK punctate epithelial keratitis; PEE punctate epithelial erosions; DES dry eye syndrome; MGD meibomian gland dysfunction;  ATs artificial tears; PFAT's preservative free artificial tears; [place] nuclear sclerotic cataract; PSC posterior subcapsular cataract; ERM epi-retinal membrane; PVD posterior vitreous detachment; RD retinal detachment; DM diabetes mellitus; DR diabetic  retinopathy; NPDR non-proliferative diabetic retinopathy; PDR proliferative diabetic retinopathy; CSME clinically significant macular edema; DME diabetic macular edema; dbh dot blot hemorrhages; CWS cotton wool spot; POAG primary open angle glaucoma; C/D cup-to-disc ratio; HVF humphrey visual field; GVF goldmann visual field; OCT optical coherence tomography; IOP intraocular pressure; BRVO Branch retinal vein occlusion; CRVO central retinal vein occlusion; CRAO central retinal artery occlusion; BRAO branch retinal artery occlusion; RT retinal tear; SB scleral buckle; PPV pars plana vitrectomy; VH Vitreous hemorrhage; PRP panretinal laser photocoagulation; IVK intravitreal kenalog; VMT vitreomacular traction; MH Macular hole;  NVD neovascularization of the disc; NVE neovascularization elsewhere; AREDS age related eye disease study; ARMD age related macular degeneration; POAG primary open angle glaucoma; EBMD epithelial/anterior basement membrane dystrophy; ACIOL anterior chamber intraocular lens; IOL intraocular lens; PCIOL posterior chamber intraocular lens; Phaco/IOL phacoemulsification with intraocular lens placement; [place] photorefractive keratectomy; LASIK laser assisted in situ keratomileusis; HTN hypertension; DM diabetes mellitus; COPD chronic obstructive pulmonary disease

## 2021-10-18 ENCOUNTER — Ambulatory Visit (INDEPENDENT_AMBULATORY_CARE_PROVIDER_SITE_OTHER): Payer: 59 | Admitting: Ophthalmology

## 2021-10-18 ENCOUNTER — Encounter (INDEPENDENT_AMBULATORY_CARE_PROVIDER_SITE_OTHER): Payer: Self-pay | Admitting: Ophthalmology

## 2021-10-18 ENCOUNTER — Other Ambulatory Visit: Payer: Self-pay

## 2021-10-18 DIAGNOSIS — H35033 Hypertensive retinopathy, bilateral: Secondary | ICD-10-CM

## 2021-10-18 DIAGNOSIS — H43393 Other vitreous opacities, bilateral: Secondary | ICD-10-CM

## 2021-10-18 DIAGNOSIS — H35351 Cystoid macular degeneration, right eye: Secondary | ICD-10-CM

## 2021-10-18 DIAGNOSIS — Z961 Presence of intraocular lens: Secondary | ICD-10-CM

## 2021-10-18 DIAGNOSIS — I10 Essential (primary) hypertension: Secondary | ICD-10-CM

## 2021-11-05 NOTE — Progress Notes (Signed)
[facility] Note  11/08/2021     CHIEF COMPLAINT Patient presents for Post-op Follow-up   HISTORY OF PRESENT ILLNESS: Andrew Mitchell is a 61 y.o. male who presents to the clinic today for:  HPI     Post-op Follow-up   In both eyes.  I, the attending physician,  performed the HPI with the patient and updated documentation appropriately.        Comments   S/p PPV OD 10/11/21, PPV OS 08/30/21-  Doing well.  OD is still a little blurry.  Finished all drops OS, using Prednisolone TID and Brimonidine BID OD.      Last edited by [name], MD on 11/09/2021 12:36 AM.     Pt states he went to a basketball game last night and noticed that his distance vision is still blurry, he states he can see shapes, but features are difficult to discern  Referring physician: [name], MD [place],  [place] [address]  HISTORICAL INFORMATION:  Selected notes from the [facility] Referred by Dr. [name] for eval of CME   CURRENT MEDICATIONS: Current Outpatient Medications (Ophthalmic Drugs)  Medication Sig   Bromfenac Sodium (PROLENSA) 0.07 % SOLN INSTILL 1 DROP IN RIGHT EYE FOUR TIMES DAILY (Patient not taking: Reported on 10/18/2021)   LOTEMAX SM 0.38 % GEL Place 1 drop into the right eye 2 (two) times daily. (Patient not taking: Reported on 11/08/2021)   loteprednol (LOTEMAX) 0.5 % ophthalmic suspension Place 1 drop into the right eye 3 (three) times daily. (Patient not taking: Reported on 10/18/2021)   prednisoLONE acetate (PRED FORTE) 1 % ophthalmic suspension Place 1 drop into the left eye 6 (six) times daily. (Patient taking differently: Place 1 drop into the left eye daily.)   No current facility-administered medications for this visit. (Ophthalmic Drugs)   Current Outpatient Medications (Other)  Medication Sig   carbidopa-levodopa (SINEMET IR) 25-100 MG per tablet Take 0.5 tablets by mouth at bedtime. Take 1/2 to 1 tablet by  mouth daily before bedtime.   clopidogrel (PLAVIX) 75 MG tablet TAKE 1 TABLET BY MOUTH EVERY DAY   ezetimibe (ZETIA) 10 MG tablet TAKE 1 TABLET BY MOUTH EVERY DAY   famotidine (PEPCID) 40 MG tablet Take 40 mg by mouth at bedtime.   metoprolol tartrate (LOPRESSOR) 25 MG tablet Take 1 tablet (25 mg total) by mouth 2 (two) times daily. Please keep upcoming appt in March 2023 with Tamala Julian before anymore refills. Thank you   Multiple Vitamin (MULTIVITAMIN WITH MINERALS) TABS tablet Take 1 tablet by mouth in the morning. Centrum Multivitamin   nitroGLYCERIN (NITROSTAT) 0.4 MG SL tablet Place 1 tablet (0.4 mg total) under the tongue every 5 (five) minutes x 3 doses as needed for chest pain.   rosuvastatin (CRESTOR) 5 MG tablet TAKE 1 TABLET BY MOUTH EVERY DAY (Patient taking differently: Take 5 mg by mouth at bedtime.)   zolpidem (AMBIEN) 5 MG tablet Take 10 mg by mouth at bedtime as needed for sleep.   No current facility-administered medications for this visit. (Other)   REVIEW OF SYSTEMS: ROS   Positive for: Cardiovascular, Eyes, Psychiatric Negative for: Constitutional, Gastrointestinal, Neurological, Skin, Genitourinary, Musculoskeletal, HENT, Endocrine, Respiratory, Allergic/Imm, Heme/Lymph Last edited by Leonie Douglas, COA on 11/08/2021  7:57 AM.     ALLERGIES Allergies  Allergen Reactions   Shellfish Allergy Anaphylaxis    Just to shrimp   PAST MEDICAL HISTORY Past Medical History:  Diagnosis  Date   Back pain    CAD (coronary artery disease)    a. 09/2018 Inf STEMI/PCI: LM nl, LAD min irregs, D1/2 min irregs, LCX 100ost/p thrombotic (2.75x35 Orsiro DES), RCA 40d, RPAV 90.   Carpal tunnel syndrome    DDD (degenerative disc disease)    Diastolic dysfunction    a. 09/2018 Echo: EF 50-55%, prob inflat and antlat HK, Gr1 DD. Mildlly dil Ao root (77m). Nl RV fxn.   Hyperlipemia    Hypertension    Hypertensive retinopathy    OU   Mildly Dilated aortic root ([place])    a. 09/2018 Echo:  39mm.   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 Peterson Ao [facility]   COLONOSCOPY     CORONARY ANGIOGRAPHY N/A 10/08/2018   Procedure: CORONARY ANGIOGRAPHY;  Surgeon: Sherren Mocha, MD;  Location: [facility];  Service: Cardiovascular;  Laterality: N/A;   CORONARY STENT INTERVENTION N/A 10/08/2018   Procedure: CORONARY STENT INTERVENTION;  Surgeon: Sherren Mocha, MD;  Location: [facility];  Service: Cardiovascular;  Laterality: N/A;   CORONARY/GRAFT ACUTE MI REVASCULARIZATION N/A 10/08/2018   Procedure: Coronary/Graft Acute MI Revascularization;  Surgeon: Sherren Mocha, MD;  Location: [facility];  Service: Cardiovascular;  Laterality: N/A;   EYE SURGERY Bilateral 2019   Cat Sx - Dr. [name]   KNEE ARTHROSCOPY     rightx2   PARS PLANA VITRECTOMY Left 08/30/2021   Procedure: PARS PLANA VITRECTOMY WITH 25 GAUGE;  Surgeon: [name], MD;  Location: [facility];  Service: Ophthalmology;  Laterality: Left;   PARS PLANA VITRECTOMY Right 10/11/2021   Procedure: [place] VITRECTOMY;  Surgeon: [name], MD;  Location: [facility];  Service: Ophthalmology;  Laterality: Right;   PHOTOCOAGULATION WITH LASER Left 08/30/2021   Procedure: PHOTOCOAGULATION WITH LASER;  Surgeon: [name], MD;  Location: [facility];  Service: Ophthalmology;  Laterality: Left;   PHOTOCOAGULATION WITH LASER Right 10/11/2021   Procedure: PHOTOCOAGULATION WITH LASER;  Surgeon: [name], MD;  Location: [facility];  Service: Ophthalmology;  Laterality: Right;   RIB RESECTION  2003   thorasic outlet syndrome-rt   SHOULDER ARTHROSCOPY     rightx2   TONSILLECTOMY      FAMILY HISTORY History reviewed. No pertinent family history.  SOCIAL HISTORY Social History   Tobacco Use   Smoking status: Never   Smokeless tobacco: Never  Vaping Use   Vaping Use: Never used  Substance Use Topics   Alcohol use: Yes    Comment: occ   Drug use: No       OPHTHALMIC EXAM:  Base Eye Exam     Visual Acuity (Snellen - Linear)       Right Left   Dist [place] 20/40 +2 20/30 +1   Dist ph [place] 20/20 -1 20/20         Tonometry (Tonopen, 8:03 AM)       Right Left   Pressure 16 18         Pupils       Dark Light Shape React APD   Right 5 5 Round NR None   Left 4 3 Round Brisk None  Visual Fields (Counting fingers)       Left Right    Full Full         Extraocular Movement       Right Left    Full Full         Neuro/Psych     Oriented x3: Yes   Mood/Affect: Normal         Dilation     Right eye: 1.0% Mydriacyl, 2.5% Phenylephrine @ 8:03 AM           Slit Lamp and Fundus Exam     Slit Lamp Exam       Right Left   Lids/Lashes Dermatochalasis - upper lid, UL telangiectasia Dermatochalasis - upper lid   Conjunctiva/Sclera White and quiet, mild [place] nasally, sutures intact Subconjunctival hemorrhage - improved, 1+Injection, sutures intact, nasal pingeucula   Cornea Mild arcus, well healed temporal cataract wounds, 2-3+PEE, fine endo pigment / Krukenberg's spindle well healed temporal cataract wounds, trace PEE, mild tear film debris, mild arcus   Anterior Chamber Deep and quiet Deep and quiet   Iris Round and dilated Round and reactive   Lens MF PC IOL in perfect position with open PC MF PC IOL in perfect position with open PC   Anterior Vitreous Post vitrectomy, vitreous condensations and opacities gone post vitrectomy; clear; vitreous opacities gone         Fundus Exam       Right Left   Disc Pink and Sharp, Compact    C/D Ratio 0.5    Macula Flat, good foveal reflex, mild RPE mottling and clumping, No heme or  edema    Vessels attenuated    Periphery Attached, No heme, No RT/RD, 360 peripheral laser changes            IMAGING AND PROCEDURES  Imaging and Procedures for 11/08/2021  OCT, Retina - OU - Both Eyes       Right Eye Quality was good. Central Foveal Thickness: 347. Progression has been stable. Findings include no SRF, no IRF, normal foveal contour.   Left Eye Quality was good. Central Foveal Thickness: 339. Progression has been stable. Findings include normal foveal contour, no IRF, no SRF.   Notes *Images captured and stored on drive  Diagnosis / Impression:  NFP; no IRF/SRF OU  Clinical management:  See below  Abbreviations: NFP - Normal foveal profile. CME - cystoid macular edema. PED - pigment epithelial detachment. IRF - intraretinal fluid. SRF - subretinal fluid. EZ - ellipsoid zone. ERM - epiretinal membrane. ORA - outer retinal atrophy. ORT - outer retinal tubulation. SRHM - subretinal hyper-reflective material. IRHM - intraretinal hyper-reflective material            ASSESSMENT/PLAN:    ICD-10-CM   1. Cystoid macular edema of right eye  H35.351 OCT, Retina - OU - Both Eyes    2. Vitreous syneresis of both eyes  H43.393     3. Vitreous floaters of both eyes  H43.393     4. Essential hypertension  I10     5. Hypertensive retinopathy of both eyes  H35.033     6. Pseudophakia, both eyes  Z96.1     7. Dry eyes  H04.123      1. CME OD  - initially diagnosed w/ CME OU by Dr. [name] on 08.16.21 and started on Durezol and BromSite TID OU  - s/p STK OD #1, 09.30.22  - today BCVA 20/20 OD -- POW4 s/p PPV w/ EL  for vitreous opacities  - OCT shows ?early cystic changes  - restart Prolensa to qdaily OD, dec PF to BID OD  - IOP 16 -- history of steroid response but okay today -- okay to stop Brim  - monitor   2,3. Vitreous syneresis / floaters OU  - prominent symptomatic floaters OU  - s/p YAG vitreolysis OD 3.4.2020 ([facility])  - s/p PPV/EL OS,  11.17.22 - tapered off all drops, BCVA 20/20             - POW4 s/p PPV/EL OD, 12.29.22             - doing well   - BCVA 20/20 OU             - IOP good at 16,18             - cont   PF 3x/day OD -- decrease to BID                         Brimonidine BID OD -- okay to stop  - restart Prolensa qdaily OD  - start AT QID OU for corneal dryness, affecting vision             - post op instructions reviewed   - f/u 2-3 months -- POV -- DFE, OCT  4,5. Hypertensive retinopathy OU - discussed importance of tight BP control - monitor   6. Pseudophakia OU  - s/p CE/IOL OU 2019  - multifocal IOLs in perfect position, doing well  - monitor   7. Dry eyes OU - recommend artificial tears and lubricating ointment as needed   Ophthalmic Meds Ordered this visit:  No orders of the defined types were placed in this encounter.     Return for f/u 2-3 months, floaters OU, DFE, OCT.  There are no Patient Instructions on file for this visit.  This document serves as a record of services personally performed by Gardiner Sleeper, MD, PhD. It was created on their behalf by San Jetty. Owens Shark, OA an ophthalmic technician. The creation of this record is the provider's dictation and/or activities during the visit.    Electronically signed by: San Jetty. Owens Shark, [place] 01.23.2023 12:41 AM  Gardiner Sleeper, M.D., Ph.D. Diseases & Surgery of the Retina and Vitreous [facility]  I have reviewed the above documentation for accuracy and completeness, and I agree with the above. Gardiner Sleeper, M.D., Ph.D. 11/09/21 12:41 AM   Abbreviations: M myopia (nearsighted); A astigmatism; H hyperopia (farsighted); P presbyopia; Mrx spectacle prescription;  CTL contact lenses; OD right eye; OS left eye; OU both eyes  XT exotropia; ET esotropia; PEK punctate epithelial keratitis; PEE punctate epithelial erosions; DES dry eye syndrome; MGD meibomian gland dysfunction; ATs artificial tears; PFAT's  preservative free artificial tears; [place] nuclear sclerotic cataract; PSC posterior subcapsular cataract; ERM epi-retinal membrane; PVD posterior vitreous detachment; RD retinal detachment; DM diabetes mellitus; DR diabetic retinopathy; NPDR non-proliferative diabetic retinopathy; PDR proliferative diabetic retinopathy; CSME clinically significant macular edema; DME diabetic macular edema; dbh dot blot hemorrhages; CWS cotton wool spot; POAG primary open angle glaucoma; C/D cup-to-disc ratio; HVF humphrey visual field; GVF goldmann visual field; OCT optical coherence tomography; IOP intraocular pressure; BRVO Branch retinal vein occlusion; CRVO central retinal vein occlusion; CRAO central retinal artery occlusion; BRAO branch retinal artery occlusion; RT retinal tear; SB scleral buckle; PPV pars plana vitrectomy; VH Vitreous hemorrhage; PRP panretinal laser photocoagulation; IVK  intravitreal kenalog; VMT vitreomacular traction; MH Macular hole;  NVD neovascularization of the disc; NVE neovascularization elsewhere; AREDS age related eye disease study; ARMD age related macular degeneration; POAG primary open angle glaucoma; EBMD epithelial/anterior basement membrane dystrophy; ACIOL anterior chamber intraocular lens; IOL intraocular lens; PCIOL posterior chamber intraocular lens; Phaco/IOL phacoemulsification with intraocular lens placement; [place] photorefractive keratectomy; LASIK laser assisted in situ keratomileusis; HTN hypertension; DM diabetes mellitus; COPD chronic obstructive pulmonary disease

## 2021-11-08 ENCOUNTER — Encounter (INDEPENDENT_AMBULATORY_CARE_PROVIDER_SITE_OTHER): Payer: Self-pay | Admitting: Ophthalmology

## 2021-11-08 ENCOUNTER — Other Ambulatory Visit: Payer: Self-pay

## 2021-11-08 ENCOUNTER — Ambulatory Visit (INDEPENDENT_AMBULATORY_CARE_PROVIDER_SITE_OTHER): Payer: 59 | Admitting: Ophthalmology

## 2021-11-08 DIAGNOSIS — H35033 Hypertensive retinopathy, bilateral: Secondary | ICD-10-CM | POA: Diagnosis not present

## 2021-11-08 DIAGNOSIS — H43393 Other vitreous opacities, bilateral: Secondary | ICD-10-CM

## 2021-11-08 DIAGNOSIS — H04123 Dry eye syndrome of bilateral lacrimal glands: Secondary | ICD-10-CM

## 2021-11-08 DIAGNOSIS — I10 Essential (primary) hypertension: Secondary | ICD-10-CM

## 2021-11-08 DIAGNOSIS — H35351 Cystoid macular degeneration, right eye: Secondary | ICD-10-CM | POA: Diagnosis not present

## 2021-11-08 DIAGNOSIS — Z961 Presence of intraocular lens: Secondary | ICD-10-CM

## 2021-11-29 ENCOUNTER — Other Ambulatory Visit: Payer: Self-pay | Admitting: Interventional Cardiology

## 2021-12-12 NOTE — Progress Notes (Signed)
Cardiology Office Note:    Date:  12/14/2021   ID:  Hester, Forget 22-Aug-1961, MRN [id number]  PCP:  [name], MD  Cardiologist:  Sinclair Grooms, MD   Referring MD: [name], MD   Chief Complaint  Patient presents with   Coronary Artery Disease   Hyperlipidemia    History of Present Illness:    KHYREN HING is a 61 y.o. male with a hx of DOE, family h/o CAD (father with MI) , hyperlipidemia,  inferior MI 2019 treated with RCA DES, and no documentation of thoracic aneurysm.    Dyspnea has resolved.  No chest pain.  No medication side effects.  He has not noted arrhythmia.  We discussed the absence of an aneurysm on his aorta.  He has some ectasia.  No lipid studies have been done recently.  Last time was 2021.  Past Medical History:  Diagnosis Date   Back pain    CAD (coronary artery disease)    a. 09/2018 Inf STEMI/PCI: LM nl, LAD min irregs, D1/2 min irregs, LCX 100ost/p thrombotic (2.75x35 Orsiro DES), RCA 40d, RPAV 90.   Carpal tunnel syndrome    DDD (degenerative disc disease)    Diastolic dysfunction    a. 09/2018 Echo: EF 50-55%, prob inflat and antlat HK, Gr1 DD. Mildlly dil Ao root (20m). Nl RV fxn.   Hyperlipemia    Hypertension    Hypertensive retinopathy    OU   Mildly Dilated aortic root ([place])    a. 09/2018 Echo: 50mm.   Tubular adenocarcinoma ([place]) 2010   colon    Past Surgical History:  Procedure Laterality Date   CARPAL TUNNEL RELEASE Left 01/06/2013   Procedure: CARPAL TUNNEL RELEASE;  Surgeon: Wynonia Sours, MD;  Location: [facility];  Service: Orthopedics;  Laterality: Left;  ANESTHESIA: IV REGIONAL FAB   CATARACT EXTRACTION Bilateral 2019   Dr. Peterson Ao [facility]   COLONOSCOPY     CORONARY ANGIOGRAPHY N/A 10/08/2018   Procedure: CORONARY ANGIOGRAPHY;  Surgeon: Sherren Mocha, MD;  Location: [facility];  Service: Cardiovascular;  Laterality: N/A;   CORONARY STENT INTERVENTION N/A 10/08/2018   Procedure:  CORONARY STENT INTERVENTION;  Surgeon: Sherren Mocha, MD;  Location: [facility];  Service: Cardiovascular;  Laterality: N/A;   CORONARY/GRAFT ACUTE MI REVASCULARIZATION N/A 10/08/2018   Procedure: Coronary/Graft Acute MI Revascularization;  Surgeon: Sherren Mocha, MD;  Location: [facility];  Service: Cardiovascular;  Laterality: N/A;   EYE SURGERY Bilateral 2019   Cat Sx - Dr. [name]   KNEE ARTHROSCOPY     rightx2   PARS PLANA VITRECTOMY Left 08/30/2021   Procedure: PARS PLANA VITRECTOMY WITH 25 GAUGE;  Surgeon: [name], MD;  Location: [facility];  Service: Ophthalmology;  Laterality: Left;   PARS PLANA VITRECTOMY Right 10/11/2021   Procedure: [place] VITRECTOMY;  Surgeon: [name], MD;  Location: [facility];  Service: Ophthalmology;  Laterality: Right;   PHOTOCOAGULATION WITH LASER Left 08/30/2021   Procedure: PHOTOCOAGULATION WITH LASER;  Surgeon: [name], MD;  Location: [facility];  Service: Ophthalmology;  Laterality: Left;   PHOTOCOAGULATION WITH LASER Right 10/11/2021   Procedure: PHOTOCOAGULATION WITH LASER;  Surgeon: [name], MD;  Location: [facility];  Service: Ophthalmology;  Laterality: Right;   RIB RESECTION  2003   thorasic outlet syndrome-rt   SHOULDER ARTHROSCOPY     rightx2   TONSILLECTOMY      Current Medications: Current Meds  Medication Sig   Bromfenac Sodium (PROLENSA) 0.07 % SOLN INSTILL 1 DROP IN RIGHT EYE FOUR TIMES DAILY   carbidopa-levodopa (SINEMET IR) 25-100 MG per tablet Take 0.5 tablets by mouth at bedtime. Take 1/2 to 1 tablet by mouth daily before bedtime.   clopidogrel (PLAVIX) 75 MG tablet TAKE 1 TABLET BY MOUTH EVERY DAY   ezetimibe (ZETIA) 10 MG tablet Take 1 tablet (10 mg total) by mouth daily. Please keep upcoming appointment in March 2023 for future refills. Thank you   famotidine (PEPCID) 40 MG tablet Take 40 mg by mouth at bedtime.   LOTEMAX SM 0.38 % GEL Place 1 drop into the right eye 2 (two)  times daily.   loteprednol (LOTEMAX) 0.5 % ophthalmic suspension Place 1 drop into the right eye 3 (three) times daily.   metoprolol tartrate (LOPRESSOR) 25 MG tablet Take 1 tablet (25 mg total) by mouth 2 (two) times daily. Please keep upcoming appt in March 2023 with Tamala Julian before anymore refills. Thank you   Multiple Vitamin (MULTIVITAMIN WITH MINERALS) TABS tablet Take 1 tablet by mouth in the morning. Centrum Multivitamin   nitroGLYCERIN (NITROSTAT) 0.4 MG SL tablet Place 1 tablet (0.4 mg total) under the tongue every 5 (five) minutes x 3 doses as needed for chest pain.   prednisoLONE acetate (PRED FORTE) 1 % ophthalmic suspension Place 1 drop into the left eye 6 (six) times daily. (Patient taking differently: Place 1 drop into the left eye daily.)   rosuvastatin (CRESTOR) 5 MG tablet Take 1 tablet (5 mg total) by mouth daily. Please keep upcoming appointment in March 2023 for future refills. Thank you   zolpidem (AMBIEN) 5 MG tablet Take 10 mg by mouth at bedtime as needed for sleep.     Allergies:   Shellfish allergy   Social History   Socioeconomic History   Marital status: Married    Spouse name: Not on file   Number of children: Not on file   Years of education: Not on file   Highest education level: Not on file  Occupational History   Not on file  Tobacco Use   Smoking status: Never   Smokeless tobacco: Never  Vaping Use   Vaping Use: Never used  Substance and Sexual Activity   Alcohol use: Yes    Comment: occ   Drug use: No   Sexual activity: Not on file  Other Topics Concern   Not on file  Social History Narrative   Not on file   Social Determinants of Health   Financial Resource Strain: Not on file  Food Insecurity: Not on file  Transportation Needs: Not on file  Physical Activity: Not on file  Stress: Not on file  Social Connections: Not on file     Family History: The patient's family history is not on file.  ROS:   Please see the history of present  illness.    None all other systems reviewed and are negative.  EKGs/Labs/Other Studies Reviewed:    The following studies were reviewed today: CHEST CT 2020: IMPRESSION: 1. Mild ectasia of the ascending thoracic aorta measuring 3.3 cm in AP diameter. No evidence of thoracic aortic aneurysm.   2.  Mild atherosclerotic coronary artery disease.    EKG:  EKG not repeated  Recent Labs: 09/12/2021: NT-Pro BNP 86 10/11/2021: BUN 13; Creatinine, Ser 0.91; Hemoglobin 15.6; Platelets 200; Potassium 3.6; Sodium 134  Recent Lipid Panel    Component Value Date/Time   CHOL 117 07/25/2020 0000  TRIG 90 07/25/2020 0000   HDL 40 07/25/2020 0000   CHOLHDL 2.9 07/25/2020 0000   CHOLHDL 3.3 10/09/2018 0028   VLDL 7 10/09/2018 0028   LDLCALC 60 07/25/2020 0000    Physical Exam:    VS:  BP 122/78    Pulse 60    Ht 5\' 10"  (1.778 m)    Wt 188 lb 12.8 oz (85.6 kg)    SpO2 98%    BMI 27.09 kg/m     Wt Readings from Last 3 Encounters:  12/14/21 188 lb 12.8 oz (85.6 kg)  10/11/21 184 lb (83.5 kg)  09/12/21 189 lb 6.4 oz (85.9 kg)     GEN: Healthy appearing. No acute distress HEENT: Normal NECK: No JVD. LYMPHATICS: No lymphadenopathy CARDIAC: No murmur. RRR no gallop, or edema. VASCULAR:  Normal Pulses. No bruits. RESPIRATORY:  Clear to auscultation without rales, wheezing or rhonchi  ABDOMEN: Soft, non-tender, non-distended, No pulsatile mass, MUSCULOSKELETAL: No deformity  SKIN: Warm and dry NEUROLOGIC:  Alert and oriented x 3 PSYCHIATRIC:  Normal affect   ASSESSMENT:    1. Coronary artery disease involving native coronary artery of native heart with unstable angina pectoris ([place])   2. Hyperlipidemia, unspecified hyperlipidemia type   3. Essential hypertension   4. DOE (dyspnea on exertion)    PLAN:    In order of problems listed above:  He understands secondary prevention.  He understands the need to get 150 minutes of moderate activity each week. Liver and lipid panel will  be obtained today.  Continue statin therapy.  Optimize therapy if needed.   Excellent blood pressure control on metoprolol, and salt restriction. Resolved.  Does have diastolic dysfunction but symptoms were not typical.   Overall education and awareness concerning secondary risk prevention was discussed in detail: LDL less than 70, hemoglobin A1c less than 7, blood pressure target less than 130/80 mmHg, >150 minutes of moderate aerobic activity per week, avoidance of smoking, weight control (via diet and exercise), and continued surveillance/management of/for obstructive sleep apnea.    Medication Adjustments/Labs and Tests Ordered: Current medicines are reviewed at length with the patient today.  Concerns regarding medicines are outlined above.  No orders of the defined types were placed in this encounter.  No orders of the defined types were placed in this encounter.   There are no Patient Instructions on file for this visit.   Signed, Sinclair Grooms, MD  12/14/2021 7:49 AM    [place]

## 2021-12-14 ENCOUNTER — Encounter: Payer: Self-pay | Admitting: Interventional Cardiology

## 2021-12-14 ENCOUNTER — Other Ambulatory Visit: Payer: Self-pay

## 2021-12-14 ENCOUNTER — Ambulatory Visit: Payer: 59 | Admitting: Interventional Cardiology

## 2021-12-14 VITALS — BP 122/78 | HR 60 | Ht 70.0 in | Wt 188.8 lb

## 2021-12-14 DIAGNOSIS — I25110 Atherosclerotic heart disease of native coronary artery with unstable angina pectoris: Secondary | ICD-10-CM

## 2021-12-14 DIAGNOSIS — E785 Hyperlipidemia, unspecified: Secondary | ICD-10-CM

## 2021-12-14 DIAGNOSIS — I10 Essential (primary) hypertension: Secondary | ICD-10-CM | POA: Diagnosis not present

## 2021-12-14 DIAGNOSIS — R0609 Other forms of dyspnea: Secondary | ICD-10-CM

## 2021-12-14 LAB — HEPATIC FUNCTION PANEL
ALT: 23 IU/L (ref 0–44)
AST: 20 IU/L (ref 0–40)
Albumin: 4.4 g/dL (ref 3.8–4.8)
Alkaline Phosphatase: 67 IU/L (ref 44–121)
Bilirubin Total: 0.6 mg/dL (ref 0.0–1.2)
Bilirubin, Direct: 0.15 mg/dL (ref 0.00–0.40)
Total Protein: 6.4 g/dL (ref 6.0–8.5)

## 2021-12-14 LAB — LIPID PANEL
Chol/HDL Ratio: 3.3 ratio (ref 0.0–5.0)
Cholesterol, Total: 135 mg/dL (ref 100–199)
HDL: 41 mg/dL (ref >39)
LDL Chol Calc (NIH): 75 mg/dL (ref 0–99)
Triglycerides: 99 mg/dL (ref 0–149)
VLDL Cholesterol Cal: 19 mg/dL (ref 5–40)

## 2021-12-14 NOTE — Patient Instructions (Signed)
Medication Instructions:  ?Your physician recommends that you continue on your current medications as directed. Please refer to the Current Medication list given to you today. ? ?*If you need a refill on your cardiac medications before your next appointment, please call your pharmacy* ? ? ?Lab Work: ?Liver and Lipid today ? ?If you have labs (blood work) drawn today and your tests are completely normal, you will receive your results only by: ?MyChart Message (if you have MyChart) OR ?A paper copy in the mail ?If you have any lab test that is abnormal or we need to change your treatment, we will call you to review the results. ? ? ?Testing/Procedures: ?None ? ? ?Follow-Up: ?At [facility], you and your health needs are our priority.  As part of our continuing mission to provide you with exceptional heart care, we have created designated Provider Care Teams.  These Care Teams include your primary Cardiologist (physician) and Advanced Practice Providers (APPs -  Physician Assistants and Nurse Practitioners) who all work together to provide you with the care you need, when you need it. ? ?We recommend signing up for the patient portal called "MyChart".  Sign up information is provided on this After Visit Summary.  MyChart is used to connect with patients for Virtual Visits (Telemedicine).  Patients are able to view lab/test results, encounter notes, upcoming appointments, etc.  Non-urgent messages can be sent to your provider as well.   ?To learn more about what you can do with MyChart, go to [email or web address].   ? ?Your next appointment:   ?1 year(s) ? ?The format for your next appointment:   ?In Person ? ?Provider:   ?Sinclair Grooms, MD   ? ? ?Other Instructions ?  ?

## 2022-01-07 NOTE — Progress Notes (Signed)
?[facility] Note ? ?01/10/2022 ?  ? ?CHIEF COMPLAINT ?Patient presents for Retina Follow Up ? ? ?HISTORY OF PRESENT ILLNESS: ?Andrew Mitchell is a 61 y.o. male who presents to the clinic today for:  ?HPI   ? ? Retina Follow Up   ?Patient presents with  Other.  In both eyes.  This started 2 months ago.  I, the attending physician,  performed the HPI with the patient and updated documentation appropriately. ? ?  ?  ? ? Comments   ?Patient here for 2 months follow up for CME OD, floaters OU. Patient states vision doing better. Eye are dry. No eye pain. Still using drops. ? ?  ?  ?Last edited by [name], MD on 01/10/2022  8:59 AM.  ?  ?Pt states he thought he was doing well until he came in here this morning, he states his eyes have been feeling dry at home, he is using Genteal drops 3-4x/day, PF and Prolensa once a day OD ? ?Referring physician: ?[name], MD ?[address] ?[place],  [place] [address] ? ?HISTORICAL INFORMATION:  ?Selected notes from the [facility] ?Referred by Dr. [name] for eval of CME  ? ?CURRENT MEDICATIONS: ?Current Outpatient Medications (Ophthalmic Drugs)  ?Medication Sig  ? Bromfenac Sodium (PROLENSA) 0.07 % SOLN Place 1 drop into both eyes 4 (four) times daily. INSTILL 1 DROP IN RIGHT EYE FOUR TIMES DAILY  ? prednisoLONE acetate (PRED FORTE) 1 % ophthalmic suspension Place 1 drop into the left eye 6 (six) times daily. (Patient taking differently: Place 1 drop into the left eye daily.)  ? LOTEMAX SM 0.38 % GEL Place 1 drop into both eyes 2 (two) times daily.  ? loteprednol (LOTEMAX) 0.5 % ophthalmic suspension Place 1 drop into the right eye 3 (three) times daily.  ? ?No current facility-administered medications for this visit. (Ophthalmic Drugs)  ? ?Current Outpatient Medications (Other)  ?Medication Sig  ? carbidopa-levodopa (SINEMET IR) 25-100 MG per tablet Take 0.5 tablets by mouth at bedtime. Take 1/2 to 1 tablet by mouth daily before  bedtime.  ? clopidogrel (PLAVIX) 75 MG tablet TAKE 1 TABLET BY MOUTH EVERY DAY  ? ezetimibe (ZETIA) 10 MG tablet Take 1 tablet (10 mg total) by mouth daily. Please keep upcoming appointment in March 2023 for future refills. Thank you  ? famotidine (PEPCID) 40 MG tablet Take 40 mg by mouth at bedtime.  ? metoprolol tartrate (LOPRESSOR) 25 MG tablet Take 1 tablet (25 mg total) by mouth 2 (two) times daily. Please keep upcoming appt in March 2023 with Tamala Julian before anymore refills. Thank you  ? Multiple Vitamin (MULTIVITAMIN WITH MINERALS) TABS tablet Take 1 tablet by mouth in the morning. Centrum Multivitamin  ? nitroGLYCERIN (NITROSTAT) 0.4 MG SL tablet Place 1 tablet (0.4 mg total) under the tongue every 5 (five) minutes x 3 doses as needed for chest pain.  ? rosuvastatin (CRESTOR) 5 MG tablet Take 1 tablet (5 mg total) by mouth daily. Please keep upcoming appointment in March 2023 for future refills. Thank you  ? zolpidem (AMBIEN) 5 MG tablet Take 10 mg by mouth at bedtime as needed for sleep.  ? ?No current facility-administered medications for this visit. (Other)  ? ?REVIEW OF SYSTEMS: ?ROS   ?Positive for: Cardiovascular, Eyes, Psychiatric ?Negative for: Constitutional, Gastrointestinal, Neurological, Skin, Genitourinary, Musculoskeletal, HENT, Endocrine, Respiratory, Allergic/Imm, Heme/Lymph ?Last edited by Theodore Demark, COA on 01/10/2022  7:52 AM.  ?  ? ?  ALLERGIES ?Allergies  ?Allergen Reactions  ? Shellfish Allergy Anaphylaxis  ?  Just to shrimp  ? ?PAST MEDICAL HISTORY ?Past Medical History:  ?Diagnosis Date  ? Back pain   ? CAD (coronary artery disease)   ? a. 09/2018 Inf STEMI/PCI: LM nl, LAD min irregs, D1/2 min irregs, LCX 100ost/p thrombotic (2.75x35 Orsiro DES), RCA 40d, RPAV 90.  ? Carpal tunnel syndrome   ? DDD (degenerative disc disease)   ? Diastolic dysfunction   ? a. 09/2018 Echo: EF 50-55%, prob inflat and antlat HK, Gr1 DD. Mildlly dil Ao root (50m. Nl RV fxn.  ? Hyperlipemia   ?  Hypertension   ? Hypertensive retinopathy   ? OU  ? Mildly Dilated aortic root (HCC)   ? a. 09/2018 Echo: 447m  ? Tubular adenocarcinoma (HCEmp[place]2010  ? colon  ? ?Past Surgical History:  ?Procedure Laterality Date  ? CARPAL TUNNEL RELEASE Left 01/06/2013  ? Procedure: CARPAL TUNNEL RELEASE;  Surgeon: GaWynonia SoursMD;  [facility];  Laterality: Left;  ANESTHESIA: IV REGIONAL FAB  ? CATARACT EXTRACTION Bilateral 2019  ? Dr. KaVevelyn Royals? COLONOSCOPY    ? CORONARY ANGIOGRAPHY N/A 10/08/2018  ? Procedure: CORONARY ANGIOGRAPHY;  Surgeon: CoSherren MochaMD;  [facility];  Service: Cardiovascular;  Laterality: N/A;  ? CORONARY STENT INTERVENTION N/A 10/08/2018  ? Procedure: CORONARY STENT INTERVENTION;  Surgeon: CoSherren MochaMD;  [facility];  Service: Cardiovascular;  Laterality: N/A;  ? CORONARY/GRAFT ACUTE MI REVASCULARIZATION N/A 10/08/2018  ? Procedure: Coronary/Graft Acute MI Revascularization;  Surgeon: CoSherren MochaMD;  [facility];  Service: Cardiovascular;  Laterality: N/A;  ? EYE SURGERY Bilateral 2019  ? Cat Sx - Dr. KaVevelyn Royals? KNEE ARTHROSCOPY    ? rightx2  ? PARS PLANA VITRECTOMY Left 08/30/2021  ? Procedure: PARS PLANA VITRECTOMY WITH 25 GAUGE;  Surgeon: ZaBernarda CaffeyMD;  [facility] Ophthalmology;  Laterality: Left;  ? PARS PLANA VITRECTOMY Right 10/11/2021  ? Procedure: TWENTY-FIVE GAUGE PARS PLANA VITRECTOMY;  Surgeon: ZaBernarda CaffeyMD;  [facility]: Ophthalmology;  Laterality: Right;  ? PHOTOCOAGULATION WITH LASER Left 08/30/2021  ? Procedure: PHOTOCOAGULATION WITH LASER;  Surgeon: ZaBernarda CaffeyMD;  [facility] Ophthalmology;  Laterality: Left;  ? PHOTOCOAGULATION WITH LASER Right 10/11/2021  ? Procedure: PHOTOCOAGULATION WITH LASER;  Surgeon: ZaBernarda CaffeyMD;  [facility];  Laterality: Right;  ? RIB  RESECTION  2003  ? thorasic outlet syndrome-rt  ? SHOULDER ARTHROSCOPY    ? rightx2  ? TONSILLECTOMY    ? ?FAMILY HISTORY ?History reviewed. No pertinent family history. ? ?SOCIAL HISTORY ?Social History  ? ?Tobacco Use  ? Smoking status: Never  ? Smokeless tobacco: Never  ?Vaping Use  ? Vaping Use: Never used  ?Substance Use Topics  ? Alcohol use: Yes  ?  Comment: occ  ? Drug use: No  ?  ? ?  ?OPHTHALMIC EXAM: ? ?Base Eye Exam   ? ? Visual Acuity (Snellen - Linear)   ? ?   Right Left  ? Dist [place] 20/60 -2 20/20 -1  ? Dist ph [place] 20/40 -1   ? ?  ?  ? ? Tonometry (Tonopen, 7:48 AM)   ? ?   Right Left  ? Pressure 26,27 18  ? ?  ?  ? ? Pachymetry   ? ?  01/10/2022  ? ?  ?  ? ? Pupils   ? ?   Dark Light Shape React APD  ? Right 5 5 Round NR None  ? Left 4 3 Round Brisk None  ? ?  ?  ? ? Visual Fields (Counting fingers)   ? ?   Left Right  ?  Full Full  ? ?  ?  ? ? Extraocular Movement   ? ?   Right Left  ?  Full, Ortho Full, Ortho  ? ?  ?  ? ? Neuro/Psych   ? ? Oriented x3: Yes  ? Mood/Affect: Normal  ? ?  ?  ? ? Dilation   ? ? Both eyes: 1.0% Mydriacyl, 2.5% Phenylephrine @ 7:48 AM  ? ?  ?  ? ?  ? ?Slit Lamp and Fundus Exam   ? ? Slit Lamp Exam   ? ?   Right Left  ? Lids/Lashes Dermatochalasis - upper lid, UL telangiectasia Dermatochalasis - upper lid  ? Conjunctiva/Sclera White and quiet White and quiet  ? Cornea Mild arcus, well healed temporal cataract wounds, trace PEE well healed temporal cataract wounds, trace PEE, mild arcus  ? Anterior Chamber Deep, 0.5+cell/pigment Deep, rare cell  ? Iris Round and dilated Round and dilated  ? Lens MF PC IOL in perfect position with open PC MF PC IOL in perfect position with open PC  ? Anterior Vitreous Post vitrectomy, clear -- vitreous condensations and opacities gone post vitrectomy; clear; vitreous opacities gone  ? ?  ?  ? ? Fundus Exam   ? ?   Right Left  ? Disc Pink and Sharp, Compact Pink and Sharp  ? C/D Ratio 0.5 0.5  ? Macula Flat, good foveal reflex, mild RPE mottling  and clumping, mild ERM, trace cystic changes, No heme Flat, good foveal reflex, mild RPE mottling and clumping, No heme or edema, trace cystic changes  ? Vessels attenuated attenuated, Tortuous  ? Periphery Attached, N

## 2022-01-10 ENCOUNTER — Encounter (INDEPENDENT_AMBULATORY_CARE_PROVIDER_SITE_OTHER): Payer: Self-pay | Admitting: Ophthalmology

## 2022-01-10 ENCOUNTER — Ambulatory Visit (INDEPENDENT_AMBULATORY_CARE_PROVIDER_SITE_OTHER): Payer: 59 | Admitting: Ophthalmology

## 2022-01-10 DIAGNOSIS — H35371 Puckering of macula, right eye: Secondary | ICD-10-CM | POA: Diagnosis not present

## 2022-01-10 DIAGNOSIS — I10 Essential (primary) hypertension: Secondary | ICD-10-CM

## 2022-01-10 DIAGNOSIS — H35353 Cystoid macular degeneration, bilateral: Secondary | ICD-10-CM | POA: Diagnosis not present

## 2022-01-10 DIAGNOSIS — Z961 Presence of intraocular lens: Secondary | ICD-10-CM

## 2022-01-10 DIAGNOSIS — H35351 Cystoid macular degeneration, right eye: Secondary | ICD-10-CM

## 2022-01-10 DIAGNOSIS — H43393 Other vitreous opacities, bilateral: Secondary | ICD-10-CM | POA: Diagnosis not present

## 2022-01-10 DIAGNOSIS — H35033 Hypertensive retinopathy, bilateral: Secondary | ICD-10-CM

## 2022-01-10 DIAGNOSIS — H04123 Dry eye syndrome of bilateral lacrimal glands: Secondary | ICD-10-CM

## 2022-01-10 MED ORDER — PROLENSA 0.07 % OP SOLN: 1.0000 [drp] | Freq: Four times a day (QID) | OPHTHALMIC | 6 refills | Status: DC

## 2022-01-10 MED ORDER — LOTEMAX SM 0.38 % OP GEL: 1.0000 [drp] | Freq: Two times a day (BID) | OPHTHALMIC | 5 refills | Status: DC

## 2022-01-15 ENCOUNTER — Encounter (INDEPENDENT_AMBULATORY_CARE_PROVIDER_SITE_OTHER): Payer: Self-pay | Admitting: Ophthalmology

## 2022-01-19 ENCOUNTER — Other Ambulatory Visit: Payer: Self-pay | Admitting: Interventional Cardiology

## 2022-01-28 NOTE — Progress Notes (Signed)
?[facility] Note ? ?01/31/2022 ?  ? ?CHIEF COMPLAINT ?Patient presents for Retina Follow Up ? ? ?HISTORY OF PRESENT ILLNESS: ?Andrew Mitchell is a 61 y.o. male who presents to the clinic today for:  ?HPI   ? ? Retina Follow Up   ?Patient presents with  Other.  In both eyes.  Duration of 3 weeks.  Since onset it is stable.  I, the attending physician,  performed the HPI with the patient and updated documentation appropriately. ? ?  ?  ? ? Comments   ?3 week follow up CME OU- OS is doing great, OD still blurry at long distances.  OU is still getting dry.  ?Taking Prolensa QID, Lotemax QID, and Cosopt BID OU ? ?  ?  ?Last edited by [name], MD on 01/31/2022 11:41 AM.  ?  ? ?Pt feels like vision has improved ? ? ?Referring physician: ?[name], MD ?[address] ?[place],  [place] [address] ? ?HISTORICAL INFORMATION:  ?Selected notes from the [facility] ?Referred by Dr. [name] for eval of CME  ? ?CURRENT MEDICATIONS: ?Current Outpatient Medications (Ophthalmic Drugs)  ?Medication Sig  ? Bromfenac Sodium (PROLENSA) 0.07 % SOLN Place 1 drop into both eyes 4 (four) times daily. INSTILL 1 DROP IN RIGHT EYE FOUR TIMES DAILY  ? LOTEMAX SM 0.38 % GEL Place 1 drop into both eyes 2 (two) times daily.  ? loteprednol (LOTEMAX) 0.5 % ophthalmic suspension Place 1 drop into the right eye 3 (three) times daily.  ? prednisoLONE acetate (PRED FORTE) 1 % ophthalmic suspension Place 1 drop into the left eye 6 (six) times daily. (Patient not taking: Reported on 01/31/2022)  ? ?No current facility-administered medications for this visit. (Ophthalmic Drugs)  ? ?Current Outpatient Medications (Other)  ?Medication Sig  ? carbidopa-levodopa (SINEMET IR) 25-100 MG per tablet Take 0.5 tablets by mouth at bedtime. Take 1/2 to 1 tablet by mouth daily before bedtime.  ? clopidogrel (PLAVIX) 75 MG tablet TAKE 1 TABLET BY MOUTH EVERY DAY  ? ezetimibe (ZETIA) 10 MG tablet Take 1 tablet (10 mg total)  by mouth daily. Please keep upcoming appointment in March 2023 for future refills. Thank you  ? famotidine (PEPCID) 40 MG tablet Take 40 mg by mouth at bedtime.  ? metoprolol tartrate (LOPRESSOR) 25 MG tablet Take 1 tablet (25 mg total) by mouth 2 (two) times daily. Please keep upcoming appt in March 2023 with Tamala Julian before anymore refills. Thank you  ? Multiple Vitamin (MULTIVITAMIN WITH MINERALS) TABS tablet Take 1 tablet by mouth in the morning. Centrum Multivitamin  ? nitroGLYCERIN (NITROSTAT) 0.4 MG SL tablet Place 1 tablet (0.4 mg total) under the tongue every 5 (five) minutes x 3 doses as needed for chest pain.  ? rosuvastatin (CRESTOR) 5 MG tablet Take 1 tablet (5 mg total) by mouth daily. Please keep upcoming appointment in March 2023 for future refills. Thank you  ? zolpidem (AMBIEN) 5 MG tablet Take 10 mg by mouth at bedtime as needed for sleep.  ? ?No current facility-administered medications for this visit. (Other)  ? ?REVIEW OF SYSTEMS: ?ROS   ?Positive for: Cardiovascular, Eyes, Psychiatric ?Negative for: Constitutional, Gastrointestinal, Neurological, Skin, Genitourinary, Musculoskeletal, HENT, Endocrine, Respiratory, Allergic/Imm, Heme/Lymph ?Last edited by Leonie Douglas, COA on 01/31/2022  8:10 AM.  ?  ? ?ALLERGIES ?Allergies  ?Allergen Reactions  ? Shellfish Allergy Anaphylaxis  ?  Just to shrimp  ? ?PAST MEDICAL HISTORY ?Past Medical History:  ?  Diagnosis Date  ? Back pain   ? CAD (coronary artery disease)   ? a. 09/2018 Inf STEMI/PCI: LM nl, LAD min irregs, D1/2 min irregs, LCX 100ost/p thrombotic (2.75x35 Orsiro DES), RCA 40d, RPAV 90.  ? Carpal tunnel syndrome   ? DDD (degenerative disc disease)   ? Diastolic dysfunction   ? a. 09/2018 Echo: EF 50-55%, prob inflat and antlat HK, Gr1 DD. Mildlly dil Ao root (80m. Nl RV fxn.  ? Hyperlipemia   ? Hypertension   ? Hypertensive retinopathy   ? OU  ? Mildly Dilated aortic root (HCC)   ? a. 09/2018 Echo: 428m  ? Tubular adenocarcinoma ([id number]  ?  colon  ? ?Past Surgical History:  ?Procedure Laterality Date  ? CARPAL TUNNEL RELEASE Left 01/06/2013  ? Procedure: CARPAL TUNNEL RELEASE;  Surgeon: GaWynonia SoursMD;  [facility] [facility];  Laterality: Left;  ANESTHESIA: IV REGIONAL FAB  ? CATARACT EXTRACTION Bilateral 2019  ? Dr. KaVevelyn Royals? COLONOSCOPY    ? CORONARY ANGIOGRAPHY N/A 10/08/2018  ? Procedure: CORONARY ANGIOGRAPHY;  Surgeon: CoSherren MochaMD;  [facility];  Service: Cardiovascular;  Laterality: N/A;  ? CORONARY STENT INTERVENTION N/A 10/08/2018  ? Procedure: CORONARY STENT INTERVENTION;  Surgeon: CoSherren MochaMD;  [facility];  Service: Cardiovascular;  Laterality: N/A;  ? CORONARY/GRAFT ACUTE MI REVASCULARIZATION N/A 10/08/2018  ? Procedure: Coronary/Graft Acute MI Revascularization;  Surgeon: CoSherren MochaMD;  [facility];  Service: Cardiovascular;  Laterality: N/A;  ? EYE SURGERY Bilateral 2019  ? Cat Sx - Dr. KaVevelyn Royals? KNEE ARTHROSCOPY    ? rightx2  ? PARS PLANA VITRECTOMY Left 08/30/2021  ? Procedure: PARS PLANA VITRECTOMY WITH 25 GAUGE;  Surgeon: ZaBernarda CaffeyMD;  [facility] Ophthalmology;  Laterality: Left;  ? PARS PLANA VITRECTOMY Right 10/11/2021  ? Procedure: TWENTY-FIVE GAUGE PARS PLANA VITRECTOMY;  Surgeon: ZaBernarda CaffeyMD;  [facility] Ophthalmology;  Laterality: Right;  ? PHOTOCOAGULATION WITH LASER Left 08/30/2021  ? Procedure: PHOTOCOAGULATION WITH LASER;  Surgeon: ZaBernarda CaffeyMD;  [facility]hthal[facility];  Laterality: Left;  ? PHOTOCOAGULATION WITH LASER Right 10/11/2021  ? Procedure: PHOTOCOAGULATION WITH LASER;  Surgeon: ZaBernarda CaffeyMD;  [facility]: Ophthalmology;  Laterality: Right;  ? RIB RESECTION  2003  ? thorasic outlet syndrome-rt  ? SHOULDER ARTHROSCOPY    ? rightx2  ? TONSILLECTOMY    ? ?FAMILY HISTORY ?History reviewed. No pertinent  family history. ? ?SOCIAL HISTORY ?Social History  ? ?Tobacco Use  ? Smoking status: Never  ? Smokeless tobacco: Never  ?Vaping Use  ? Vaping Use: Never used  ?Substance Use Topics  ? Alcohol use: Yes  ?  Comment: occ  ? Drug use: No  ?  ? ?  ?OPHTHALMIC EXAM: ? ?Base Eye Exam   ? ? Visual Acuity (Snellen - Linear)   ? ?   Right Left  ? Dist [place] 20/30 +2 20/30 +2  ? Dist ph [place] NI 20/25 +2  ? ?  ?  ? ? Tonometry (Tonopen, 8:17 AM)   ? ?   Right Left  ? Pressure 13 15  ? ?  ?  ? ? Pupils   ? ?   Dark Light Shape React APD  ? Right 5 5 Round NR None  ? Left 4 3 Round Brisk None  ? ?  ?  ? ?  Visual Fields (Counting fingers)   ? ?   Left Right  ?  Full Full  ? ?  ?  ? ? Extraocular Movement   ? ?   Right Left  ?  Full Full  ? ?  ?  ? ? Neuro/Psych   ? ? Oriented x3: Yes  ? Mood/Affect: Normal  ? ?  ?  ? ? Dilation   ? ? Both eyes: 1.0% Mydriacyl, 2.5% Phenylephrine @ 8:17 AM  ? ?  ?  ? ?  ? ?Slit Lamp and Fundus Exam   ? ? Slit Lamp Exam   ? ?   Right Left  ? Lids/Lashes Dermatochalasis - upper lid, UL telangiectasia Dermatochalasis - upper lid  ? Conjunctiva/Sclera White and quiet White and quiet  ? Cornea Mild arcus, well healed temporal cataract wounds, 1+PEE well healed temporal cataract wounds, trace PEE, mild arcus  ? Anterior Chamber Deep, 0.5+cell/pigment Deep, rare cell  ? Iris Round and dilated Round and dilated  ? Lens MF PC IOL in perfect position with open PC MF PC IOL in perfect position with open PC  ? Anterior Vitreous Post vitrectomy, clear -- vitreous condensations and opacities gone post vitrectomy; clear; vitreous opacities gone  ? ?  ?  ? ? Fundus Exam   ? ?   Right Left  ? Disc Pink and Sharp, Compact Pink and Sharp  ? C/D Ratio 0.3 0.5  ? Macula Flat, good foveal reflex, mild RPE mottling and clumping, mild ERM, trace cystic changes, No heme Flat, good foveal reflex, mild RPE mottling and clumping, No heme or edema, trace cystic changes - improved, trace ERM  ? Vessels attenuated attenuated,  Tortuous  ? Periphery Attached, No heme, No RT/RD, 360 peripheral laser changes Attached, No heme, No RT/RD, good 360 laser changes  ? ?  ?  ? ?  ? ?IMAGING AND PROCEDURES  ?Imaging and Procedures for 01/31/2022 ?

## 2022-01-31 ENCOUNTER — Encounter (INDEPENDENT_AMBULATORY_CARE_PROVIDER_SITE_OTHER): Payer: Self-pay | Admitting: Ophthalmology

## 2022-01-31 ENCOUNTER — Ambulatory Visit (INDEPENDENT_AMBULATORY_CARE_PROVIDER_SITE_OTHER): Payer: 59 | Admitting: Ophthalmology

## 2022-01-31 DIAGNOSIS — H35353 Cystoid macular degeneration, bilateral: Secondary | ICD-10-CM

## 2022-01-31 DIAGNOSIS — Z961 Presence of intraocular lens: Secondary | ICD-10-CM

## 2022-01-31 DIAGNOSIS — H35371 Puckering of macula, right eye: Secondary | ICD-10-CM | POA: Diagnosis not present

## 2022-01-31 DIAGNOSIS — H35033 Hypertensive retinopathy, bilateral: Secondary | ICD-10-CM

## 2022-01-31 DIAGNOSIS — I10 Essential (primary) hypertension: Secondary | ICD-10-CM

## 2022-01-31 DIAGNOSIS — H04123 Dry eye syndrome of bilateral lacrimal glands: Secondary | ICD-10-CM

## 2022-01-31 DIAGNOSIS — H43393 Other vitreous opacities, bilateral: Secondary | ICD-10-CM | POA: Diagnosis not present

## 2022-01-31 MED ORDER — TRIAMCINOLONE ACETONIDE 40 MG/ML IJ SUSP FOR KALEIDOSCOPE
40.0000 mg | INTRAMUSCULAR | Status: AC | PRN
  Administered 2022-01-31: 40 mg via INTRAVITREAL

## 2022-03-04 ENCOUNTER — Other Ambulatory Visit: Payer: Self-pay | Admitting: Interventional Cardiology

## 2022-03-04 NOTE — Progress Notes (Signed)
[facility] Note  03/07/2022    CHIEF COMPLAINT Patient presents for Retina Follow Up   HISTORY OF PRESENT ILLNESS: Andrew Mitchell is a 61 y.o. male who presents to the clinic today for:  HPI     Retina Follow Up   Patient presents with  Other.  In both eyes.  This started 5 weeks ago.  I, the attending physician,  performed the HPI with the patient and updated documentation appropriately.        Comments   Patient here for 5 weeks retina follow up for CME OU. Patient states vision OD still not right. It is blurry. No eye pain. OS is ok. Can see score board at the baseball files but not the letters.      Last edited by [name], MD on 03/07/2022 11:09 AM.    Pt feels like vision has improved   Referring physician: [name], MD [place],  [place] [address]  HISTORICAL INFORMATION:  Selected notes from the MEDICAL RECORD NUMBER Referred by Dr. [name] for eval of CME   CURRENT MEDICATIONS: Current Outpatient Medications (Ophthalmic Drugs)  Medication Sig   Bromfenac Sodium (PROLENSA) 0.07 % SOLN Place 1 drop into both eyes 4 (four) times daily. INSTILL 1 DROP IN RIGHT EYE FOUR TIMES DAILY   LOTEMAX SM 0.38 % GEL Place 1 drop into both eyes 2 (two) times daily.   loteprednol (LOTEMAX) 0.5 % ophthalmic suspension Place 1 drop into the right eye 3 (three) times daily.   prednisoLONE acetate (PRED FORTE) 1 % ophthalmic suspension Place 1 drop into the left eye 6 (six) times daily. (Patient not taking: Reported on 01/31/2022)   No current facility-administered medications for this visit. (Ophthalmic Drugs)   Current Outpatient Medications (Other)  Medication Sig   carbidopa-levodopa (SINEMET IR) 25-100 MG per tablet Take 0.5 tablets by mouth at bedtime. Take 1/2 to 1 tablet by mouth daily before bedtime.   clopidogrel (PLAVIX) 75 MG tablet TAKE 1 TABLET BY MOUTH EVERY DAY   ezetimibe (ZETIA) 10 MG tablet Take 1 tablet  (10 mg total) by mouth daily.   famotidine (PEPCID) 40 MG tablet Take 40 mg by mouth at bedtime.   metoprolol tartrate (LOPRESSOR) 25 MG tablet TAKE 1 TABLET BY MOUTH 2 TIMES DAILY. PLEASE KEEP UPCOMING APPT IN [facility] WITH SMITH FOR MORE REFILLS   Multiple Vitamin (MULTIVITAMIN WITH MINERALS) TABS tablet Take 1 tablet by mouth in the morning. Centrum Multivitamin   nitroGLYCERIN (NITROSTAT) 0.4 MG SL tablet Place 1 tablet (0.4 mg total) under the tongue every 5 (five) minutes x 3 doses as needed for chest pain.   rosuvastatin (CRESTOR) 5 MG tablet Take 1 tablet (5 mg total) by mouth daily.   zolpidem (AMBIEN) 5 MG tablet Take 10 mg by mouth at bedtime as needed for sleep.   No current facility-administered medications for this visit. (Other)   REVIEW OF SYSTEMS: ROS   Positive for: Cardiovascular, Eyes, Psychiatric Negative for: Constitutional, Gastrointestinal, Neurological, Skin, Genitourinary, Musculoskeletal, HENT, Endocrine, Respiratory, Allergic/Imm, Heme/Lymph Last edited by Theodore Demark, COA on 03/07/2022  7:54 AM.     ALLERGIES Allergies  Allergen Reactions   Shellfish Allergy Anaphylaxis    Just to shrimp   PAST MEDICAL HISTORY Past Medical History:  Diagnosis Date   Back pain    CAD (coronary artery disease)    a. 09/2018 Inf STEMI/PCI: LM nl, LAD min irregs, D1/2 min irregs, LCX  100ost/p thrombotic (2.75x35 Orsiro DES), RCA 40d, RPAV 90.   Carpal tunnel syndrome    DDD (degenerative disc disease)    Diastolic dysfunction    a. 09/2018 Echo: EF 50-55%, prob inflat and antlat HK, Gr1 DD. Mildlly dil Ao root (47m. Nl RV fxn.   Hyperlipemia    Hypertension    Hypertensive retinopathy    OU   Mildly Dilated aortic root ([place]    a. 09/2018 Echo: 446m   Tubular adenocarcinoma ([id number]   colon   Past Surgical History:  Procedure Laterality Date   CARPAL TUNNEL RELEASE Left 01/06/2013   Procedure: CARPAL TUNNEL RELEASE;  Surgeon: GaWynonia SoursMD;  [facility]  [facility] Service: Orthopedics;  Laterality: Left;  ANESTHESIA: IV REGIONAL FAB   CATARACT EXTRACTION Bilateral 2019   Dr. KaPeterson [place]   COLONOSCOPY     CORONARY ANGIOGRAPHY N/A 10/08/2018   Procedure: CORONARY ANGIOGRAPHY;  Surgeon: CoSherren MochaMD;  [facility];  Service: Cardiovascular;  Laterality: N/A;   CORONARY STENT INTERVENTION N/A 10/08/2018   Procedure: CORONARY STENT INTERVENTION;  Surgeon: CoSherren MochaMD;  [facility];  Service: Cardiovascular;  Laterality: N/A;   CORONARY/GRAFT ACUTE MI REVASCULARIZATION N/A 10/08/2018   Procedure: Coronary/Graft Acute MI Revascularization;  Surgeon: CoSherren MochaMD;  [facility];  Service: Cardiovascular;  Laterality: N/A;   EYE SURGERY Bilateral 2019   Cat Sx - Dr. KaVevelyn Royals KNEE ARTHROSCOPY     rightx2   PARS PLANA VITRECTOMY Left 08/30/2021   Procedure: PARS PLANA VITRECTOMY WITH 25 GAUGE;  Surgeon: ZaBernarda CaffeyMD;  [facility]: [facility];  Laterality: Left;   PARS PLANA VITRECTOMY Right 10/11/2021   Procedure: TW[place]andITRECTOMY;  Surgeon: ZaBernarda CaffeyMD;  [facility]: [facility]hthal[facility];  Laterality: Right;   PHOTOCOAGULATION WITH LASER Left 08/30/2021   Procedure: PHOTOCOAGULATION WITH LASER;  Surgeon: ZaBernarda CaffeyMD;  [facility] Service: Ophthalmology;  Laterality: Left;   PHOTOCOAGULATION WITH LASER Right 10/11/2021   Procedure: PHOTOCOAGULATION WITH LASER;  Surgeon: ZaBernarda CaffeyMD;  [facility]: Ophthalmology;  Laterality: Right;   RIB RESECTION  2003   thorasic outlet syndrome-rt   SHOULDER ARTHROSCOPY     rightx2   TONSILLECTOMY     FAMILY HISTORY History reviewed. No pertinent family history.  SOCIAL HISTORY Social History   Tobacco Use   Smoking status: Never   Smokeless tobacco: Never  Vaping Use   Vaping Use: Never used  Substance Use Topics    Alcohol use: Yes    Comment: occ   Drug use: No       OPHTHALMIC EXAM:  Base Eye Exam     Visual Acuity (Snellen - Linear)       Right Left   Dist [place] 20/30 +1 20/25 -1   Dist ph [place] NI 20/20 -1         Tonometry (Tonopen, 7:50 AM)       Right Left   Pressure 15 15         Pupils       Dark Light Shape React APD   Right 5 5 Round NR None   Left 4 3 Round Brisk None         Visual Fields (Counting fingers)       Left Right    Full Full  Extraocular Movement       Right Left    Full, Ortho Full, Ortho         Neuro/Psych     Oriented x3: Yes   Mood/Affect: Normal         Dilation     Both eyes: 1.0% Mydriacyl, 2.5% Phenylephrine @ 7:50 AM           Slit Lamp and Fundus Exam     Slit Lamp Exam       Right Left   Lids/Lashes Dermatochalasis - upper lid, UL telangiectasia Dermatochalasis - upper lid   Conjunctiva/Sclera White and quiet, residual STK ST quad White and quiet   Cornea Mild arcus, well healed temporal cataract wounds, 3+PEE well healed temporal cataract wounds, 1+ PEE, mild arcus, mild tear film debris   Anterior Chamber deep and clear Deep, rare cell   Iris Round and dilated Round and dilated   Lens MF PC IOL in perfect position with open PC MF PC IOL in perfect position with open PC   Anterior Vitreous Post vitrectomy, clear -- vitreous condensations and opacities gone post vitrectomy; clear; vitreous opacities gone         Fundus Exam       Right Left   Disc Pink and Sharp, Compact Pink and Sharp   C/D Ratio 0.3 0.5   Macula Flat, good foveal reflex, mild RPE mottling and clumping, mild ERM, trace cystic changes, No heme Flat, good foveal reflex, mild RPE mottling and clumping, No heme or edema, trace cystic changes - improved, trace ERM   Vessels attenuated, mild tortuosity attenuated, Tortuous   Periphery Attached, No heme, No RT/RD, 360 peripheral laser changes Attached, No heme, No RT/RD, good 360 laser  changes           IMAGING AND PROCEDURES  Imaging and Procedures for 03/07/2022  OCT, Retina - OU - Both Eyes       Right Eye Quality was good. Central Foveal Thickness: 378. Progression has been stable. Findings include no SRF, normal foveal contour, intraretinal fluid, epiretinal membrane (Persistent cystic changes / IRF temporal fovea and mac, mild ERM).   Left Eye Quality was good. Central Foveal Thickness: 345. Progression has been stable. Findings include normal foveal contour, no SRF, no IRF (Trace cystic changes IT fovea and mac - almost resolved).   Notes *Images captured and stored on drive  Diagnosis / Impression:  OD: Persistent cystic changes temporal fovea and mac, mild ERM  OS: Trace cystic changes IT fovea and mac - almost resolved  Clinical management:  See below  Abbreviations: NFP - Normal foveal profile. CME - cystoid macular edema. PED - pigment epithelial detachment. IRF - intraretinal fluid. SRF - subretinal fluid. EZ - ellipsoid zone. ERM - epiretinal membrane. ORA - outer retinal atrophy. ORT - outer retinal tubulation. SRHM - subretinal hyper-reflective material. IRHM - intraretinal hyper-reflective material            ASSESSMENT/PLAN:    ICD-10-CM   1. Cystoid macular edema of both eyes  H35.353 OCT, Retina - OU - Both Eyes    2. Vitreous syneresis of both eyes  H43.393     3. Vitreous floaters of both eyes  H43.393     4. Epiretinal membrane (ERM) of right eye  H35.371     5. Essential hypertension  I10     6. Hypertensive retinopathy of both eyes  H35.033     7. Pseudophakia, both eyes  Z96.1  8. Dry eyes  H04.123      1. CME OU  - initially diagnosed w/ CME OU by Dr. [name] on 08.16.21 and started on Durezol and BromSite TID OU  - s/p STK OD #1, 09.30.22, #2 (04.20.23)  - today BCVA 20/30 OD, 20/20 OS (improved)  - OCT shows OD: Persistent cystic changes temporal fovea and mac, mild ERM; OS: Trace cystic changes IT  fovea and mac - almost resolved  - increase Prolensa and Lotemax to QID OD, continue BID OS  - IOP 15 OU -- cont Cosopt BID OU  - f/u 4-6 weeks, DFE OCT   2,3. Vitreous syneresis / floaters OU  - prominent symptomatic floaters OU  - s/p YAG vitreolysis OD 3.4.2020 ([facility])  - s/p PPV/EL OS, 11.17.22 - tapered off all drops, BCVA 20/20             - s/p PPV/EL OD, 12.29.22             - doing well -- floaters improved  - BCVA 20/30 OD, 20/20 OS -- mild post op CME             - IOP good at 15 OU  - drop schedule as above  - cont AT QID OU for corneal dryness, affecting vision             - drop instructions reviewed   - 4-6 wks  4. Epiretinal membrane, right eye  - mild ERM -- first noted 3.30.23 visit -- stable today - BCVA 20/30 - asymptomatic, no metamorphopsia, just blurred vision - no indication for surgery at this time - monitor for now - f/u 3 mos -- DFE/OCT   5,6. Hypertensive retinopathy OU - discussed importance of tight BP control - monitor   7. Pseudophakia OU  - s/p CE/IOL OU 2019  - multifocal IOLs in perfect position, doing well  - monitor   8. Dry eyes OU - recommend artificial tears and lubricating ointment as needed   Ophthalmic Meds Ordered this visit:  No orders of the defined types were placed in this encounter.    Return for f/u 4-6 weeks, CME OU, DFE, OCT.  There are no Patient Instructions on file for this visit.  This document serves as a record of services personally performed by Gardiner Sleeper, MD, PhD. It was created on their behalf by San Jetty. Owens Shark, OA an ophthalmic technician. The creation of this record is the provider's dictation and/or activities during the visit.    Electronically signed by: San Jetty. [place], [place] 05.22.2023 11:13 AM  Gardiner Sleeper, M.D., Ph.D. Diseases & Surgery of the Retina and Vitreous [facility]  I have reviewed the above documentation for accuracy and completeness, and I agree  with the above. Gardiner Sleeper, M.D., Ph.D. 03/07/22 11:14 AM  Abbreviations: M myopia (nearsighted); A astigmatism; H hyperopia (farsighted); P presbyopia; Mrx spectacle prescription;  CTL contact lenses; OD right eye; OS left eye; OU both eyes  XT exotropia; ET esotropia; PEK punctate epithelial keratitis; PEE punctate epithelial erosions; DES dry eye syndrome; MGD meibomian gland dysfunction; ATs artificial tears; PFAT's preservative free artificial tears; [place] nuclear sclerotic cataract; PSC posterior subcapsular cataract; ERM epi-retinal membrane; PVD posterior vitreous detachment; RD retinal detachment; DM diabetes mellitus; DR diabetic retinopathy; NPDR non-proliferative diabetic retinopathy; PDR proliferative diabetic retinopathy; CSME clinically significant macular edema; DME diabetic macular edema; dbh dot blot hemorrhages; CWS cotton wool spot; POAG primary open angle glaucoma;  C/D cup-to-disc ratio; HVF humphrey visual field; GVF goldmann visual field; OCT optical coherence tomography; IOP intraocular pressure; BRVO Branch retinal vein occlusion; CRVO central retinal vein occlusion; CRAO central retinal artery occlusion; BRAO branch retinal artery occlusion; RT retinal tear; SB scleral buckle; PPV pars plana vitrectomy; VH Vitreous hemorrhage; PRP panretinal laser photocoagulation; IVK intravitreal kenalog; VMT vitreomacular traction; MH Macular hole;  NVD neovascularization of the disc; NVE neovascularization elsewhere; AREDS age related eye disease study; ARMD age related macular degeneration; POAG primary open angle glaucoma; EBMD epithelial/anterior basement membrane dystrophy; ACIOL anterior chamber intraocular lens; IOL intraocular lens; PCIOL posterior chamber intraocular lens; Phaco/IOL phacoemulsification with intraocular lens placement; [place] photorefractive keratectomy; LASIK laser assisted in situ keratomileusis; HTN hypertension; DM diabetes mellitus; COPD chronic obstructive pulmonary  disease

## 2022-03-05 ENCOUNTER — Other Ambulatory Visit: Payer: Self-pay | Admitting: Interventional Cardiology

## 2022-03-07 ENCOUNTER — Ambulatory Visit (INDEPENDENT_AMBULATORY_CARE_PROVIDER_SITE_OTHER): Payer: 59 | Admitting: Ophthalmology

## 2022-03-07 ENCOUNTER — Encounter (INDEPENDENT_AMBULATORY_CARE_PROVIDER_SITE_OTHER): Payer: Self-pay | Admitting: Ophthalmology

## 2022-03-07 DIAGNOSIS — H35033 Hypertensive retinopathy, bilateral: Secondary | ICD-10-CM

## 2022-03-07 DIAGNOSIS — H43393 Other vitreous opacities, bilateral: Secondary | ICD-10-CM | POA: Diagnosis not present

## 2022-03-07 DIAGNOSIS — I10 Essential (primary) hypertension: Secondary | ICD-10-CM | POA: Diagnosis not present

## 2022-03-07 DIAGNOSIS — H35353 Cystoid macular degeneration, bilateral: Secondary | ICD-10-CM | POA: Diagnosis not present

## 2022-03-07 DIAGNOSIS — H35371 Puckering of macula, right eye: Secondary | ICD-10-CM | POA: Diagnosis not present

## 2022-03-07 DIAGNOSIS — H04123 Dry eye syndrome of bilateral lacrimal glands: Secondary | ICD-10-CM

## 2022-03-07 DIAGNOSIS — Z961 Presence of intraocular lens: Secondary | ICD-10-CM

## 2022-03-22 NOTE — Progress Notes (Shared)
[facility] Note  04/04/2022    CHIEF COMPLAINT Patient presents for No chief complaint on file.   HISTORY OF PRESENT ILLNESS: Andrew Mitchell is a 61 y.o. male who presents to the clinic today for:   Pt feels like vision has improved   Referring physician: [name], MD [place],  [place] [id number]  HISTORICAL INFORMATION:  Selected notes from the MEDICAL RECORD NUMBER Referred by Dr. [name] for eval of CME   CURRENT MEDICATIONS: Current Outpatient Medications (Ophthalmic Drugs)  Medication Sig   Bromfenac Sodium (PROLENSA) 0.07 % SOLN Place 1 drop into both eyes 4 (four) times daily. INSTILL 1 DROP IN RIGHT EYE FOUR TIMES DAILY   LOTEMAX SM 0.38 % GEL Place 1 drop into both eyes 2 (two) times daily.   loteprednol (LOTEMAX) 0.5 % ophthalmic suspension Place 1 drop into the right eye 3 (three) times daily.   prednisoLONE acetate (PRED FORTE) 1 % ophthalmic suspension Place 1 drop into the left eye 6 (six) times daily. (Patient not taking: Reported on 01/31/2022)   No current facility-administered medications for this visit. (Ophthalmic Drugs)   Current Outpatient Medications (Other)  Medication Sig   carbidopa-levodopa (SINEMET IR) 25-100 MG per tablet Take 0.5 tablets by mouth at bedtime. Take 1/2 to 1 tablet by mouth daily before bedtime.   clopidogrel (PLAVIX) 75 MG tablet TAKE 1 TABLET BY MOUTH EVERY DAY   ezetimibe (ZETIA) 10 MG tablet Take 1 tablet (10 mg total) by mouth daily.   famotidine (PEPCID) 40 MG tablet Take 40 mg by mouth at bedtime.   metoprolol tartrate (LOPRESSOR) 25 MG tablet TAKE 1 TABLET BY MOUTH 2 TIMES DAILY. PLEASE KEEP UPCOMING APPT IN [facility] WITH SMITH FOR MORE REFILLS   Multiple Vitamin (MULTIVITAMIN WITH MINERALS) TABS tablet Take 1 tablet by mouth in the morning. Centrum Multivitamin   nitroGLYCERIN (NITROSTAT) 0.4 MG SL tablet Place 1 tablet (0.4 mg total) under the tongue every 5 (five) minutes x  3 doses as needed for chest pain.   rosuvastatin (CRESTOR) 5 MG tablet Take 1 tablet (5 mg total) by mouth daily.   zolpidem (AMBIEN) 5 MG tablet Take 10 mg by mouth at bedtime as needed for sleep.   No current facility-administered medications for this visit. (Other)   REVIEW OF SYSTEMS:   ALLERGIES Allergies  Allergen Reactions   Shellfish Allergy Anaphylaxis    Just to shrimp   PAST MEDICAL HISTORY Past Medical History:  Diagnosis Date   Back pain    CAD (coronary artery disease)    a. 09/2018 Inf STEMI/PCI: LM nl, LAD min irregs, D1/2 min irregs, LCX 100ost/p thrombotic (2.75x35 Orsiro DES), RCA 40d, RPAV 90.   Carpal tunnel syndrome    DDD (degenerative disc disease)    Diastolic dysfunction    a. 09/2018 Echo: EF 50-55%, prob inflat and antlat HK, Gr1 DD. Mildlly dil Ao root (71m. Nl RV fxn.   Hyperlipemia    Hypertension    Hypertensive retinopathy    OU   Mildly Dilated aortic root ([place]    a. 09/2018 Echo: 466m   Tubular adenocarcinoma ([id number]   colon   Past Surgical History:  Procedure Laterality Date   CARPAL TUNNEL RELEASE Left 01/06/2013   Procedure: CARPAL TUNNEL RELEASE;  Surgeon: GaWynonia SoursMD;  [facility];  Laterality: Left;  ANESTHESIA: IV REGIONAL FAB   CATARACT EXTRACTION Bilateral 2019  Dr. Peterson Ao [place]   COLONOSCOPY     CORONARY ANGIOGRAPHY N/A 10/08/2018   Procedure: CORONARY ANGIOGRAPHY;  Surgeon: Sherren Mocha, MD;  Location: [place] [facility];  Service: Cardiovascular;  Laterality: N/A;   CORONARY STENT INTERVENTION N/A 10/08/2018   Procedure: CORONARY STENT INTERVENTION;  Surgeon: Sherren Mocha, MD;  Location: [place] [facility];  Service: Cardiovascular;  Laterality: N/A;   CORONARY/GRAFT ACUTE MI REVASCULARIZATION N/A 10/08/2018   Procedure: Coronary/Graft Acute MI Revascularization;  Surgeon: Sherren Mocha, MD;  Location: [place] [facility];  Service: Cardiovascular;   Laterality: N/A;   EYE SURGERY Bilateral 2019   Cat Sx - Dr. [name]   KNEE ARTHROSCOPY     rightx2   PARS PLANA VITRECTOMY Left 08/30/2021   Procedure: PARS PLANA VITRECTOMY WITH 25 GAUGE;  Surgeon: [name], MD;  Location: [place];  Service: Ophthalmology;  Laterality: Left;   PARS PLANA VITRECTOMY Right 10/11/2021   Procedure: [place] VITRECTOMY;  Surgeon: [name], MD;  Location: [place];  Service: Ophthalmology;  Laterality: Right;   PHOTOCOAGULATION WITH LASER Left 08/30/2021   Procedure: PHOTOCOAGULATION WITH LASER;  Surgeon: [name], MD;  Location: [facility];  Service: Ophthalmology;  Laterality: Left;   PHOTOCOAGULATION WITH LASER Right 10/11/2021   Procedure: PHOTOCOAGULATION WITH LASER;  Surgeon: [name], MD;  Location: [facility];  Service: Ophthalmology;  Laterality: Right;   RIB RESECTION  2003   thorasic outlet syndrome-rt   SHOULDER ARTHROSCOPY     rightx2   TONSILLECTOMY     FAMILY HISTORY No family history on file.  SOCIAL HISTORY Social History   Tobacco Use   Smoking status: Never   Smokeless tobacco: Never  Vaping Use   Vaping Use: Never used  Substance Use Topics   Alcohol use: Yes    Comment: occ   Drug use: No       OPHTHALMIC EXAM:  Not recorded    IMAGING AND PROCEDURES  Imaging and Procedures for 04/04/2022          ASSESSMENT/PLAN:    ICD-10-CM   1. Cystoid macular edema of both eyes  H35.353     2. Vitreous syneresis of both eyes  H43.393     3. Vitreous floaters of both eyes  H43.393     4. Epiretinal membrane (ERM) of right eye  H35.371     5. Essential hypertension  I10     6. Hypertensive retinopathy of both eyes  H35.033     7. Pseudophakia, both eyes  Z96.1     8. Dry eyes  H04.123      1. CME OU  - initially diagnosed w/ CME OU by Dr. [name] on 08.16.21 and started on Durezol and BromSite TID OU  - s/p STK OD #1, 09.30.22, #2 (04.20.23)  - today BCVA 20/30 OD, 20/20  OS (improved)  - OCT shows OD: Persistent cystic changes temporal fovea and mac, mild ERM; OS: Trace cystic changes IT fovea and mac - almost resolved  - increase Prolensa and Lotemax to QID OD, continue BID OS  - IOP 15 OU -- cont Cosopt BID OU  - f/u 4-6 weeks, DFE OCT   2,3. Vitreous syneresis / floaters OU  - prominent symptomatic floaters OU  - s/p YAG vitreolysis OD 3.4.2020 ([place])  - s/p PPV/EL OS, 11.17.22 - tapered off all drops, BCVA 20/20             - s/p PPV/EL OD, 12.29.22             -  doing well -- floaters improved  - BCVA 20/30 OD, 20/20 OS -- mild post op CME             - IOP good at 15 OU  - drop schedule as above  - cont AT QID OU for corneal dryness, affecting vision             - drop instructions reviewed   - 4-6 wks  4. Epiretinal membrane, right eye  - mild ERM -- first noted 3.30.23 visit -- stable today - BCVA 20/30 - asymptomatic, no metamorphopsia, just blurred vision - no indication for surgery at this time - monitor for now - f/u 3 mos -- DFE/OCT   5,6. Hypertensive retinopathy OU - discussed importance of tight BP control - monitor   7. Pseudophakia OU  - s/p CE/IOL OU 2019  - multifocal IOLs in perfect position, doing well  - monitor   8. Dry eyes OU - recommend artificial tears and lubricating ointment as needed   Ophthalmic Meds Ordered this visit:  No orders of the defined types were placed in this encounter.    No follow-ups on file.  There are no Patient Instructions on file for this visit.  This document serves as a record of services personally performed by Gardiner Sleeper, MD, PhD. It was created on their behalf by San Jetty. Owens Shark, OA an ophthalmic technician. The creation of this record is the provider's dictation and/or activities during the visit.    Electronically signed by: San Jetty. Owens Shark, [place] 06.09.2023 7:50 AM   Gardiner Sleeper, M.D., Ph.D. Diseases & Surgery of the Retina and Vitreous [facility]: M myopia (nearsighted); A astigmatism; H hyperopia (farsighted); P presbyopia; Mrx spectacle prescription;  CTL contact lenses; OD right eye; OS left eye; OU both eyes  XT exotropia; ET esotropia; PEK punctate epithelial keratitis; PEE punctate epithelial erosions; DES dry eye syndrome; MGD meibomian gland dysfunction; ATs artificial tears; PFAT's preservative free artificial tears; [place] nuclear sclerotic cataract; PSC posterior subcapsular cataract; ERM epi-retinal membrane; PVD posterior vitreous detachment; RD retinal detachment; DM diabetes mellitus; DR diabetic retinopathy; NPDR non-proliferative diabetic retinopathy; PDR proliferative diabetic retinopathy; CSME clinically significant macular edema; DME diabetic macular edema; dbh dot blot hemorrhages; CWS cotton wool spot; POAG primary open angle glaucoma; C/D cup-to-disc ratio; HVF humphrey visual field; GVF goldmann visual field; OCT optical coherence tomography; IOP intraocular pressure; BRVO Branch retinal vein occlusion; CRVO central retinal vein occlusion; CRAO central retinal artery occlusion; BRAO branch retinal artery occlusion; RT retinal tear; SB scleral buckle; PPV pars plana vitrectomy; VH Vitreous hemorrhage; PRP panretinal laser photocoagulation; IVK intravitreal kenalog; VMT vitreomacular traction; MH Macular hole;  NVD neovascularization of the disc; NVE neovascularization elsewhere; AREDS age related eye disease study; ARMD age related macular degeneration; POAG primary open angle glaucoma; EBMD epithelial/anterior basement membrane dystrophy; ACIOL anterior chamber intraocular lens; IOL intraocular lens; PCIOL posterior chamber intraocular lens; Phaco/IOL phacoemulsification with intraocular lens placement; [place] photorefractive keratectomy; LASIK laser assisted in situ keratomileusis; HTN hypertension; DM diabetes mellitus; COPD chronic obstructive pulmonary disease

## 2022-04-04 ENCOUNTER — Ambulatory Visit (INDEPENDENT_AMBULATORY_CARE_PROVIDER_SITE_OTHER): Payer: 59 | Admitting: Ophthalmology

## 2022-04-04 ENCOUNTER — Encounter (INDEPENDENT_AMBULATORY_CARE_PROVIDER_SITE_OTHER): Payer: Self-pay | Admitting: Ophthalmology

## 2022-04-04 DIAGNOSIS — I10 Essential (primary) hypertension: Secondary | ICD-10-CM

## 2022-04-04 DIAGNOSIS — H35371 Puckering of macula, right eye: Secondary | ICD-10-CM | POA: Diagnosis not present

## 2022-04-04 DIAGNOSIS — H35353 Cystoid macular degeneration, bilateral: Secondary | ICD-10-CM | POA: Diagnosis not present

## 2022-04-04 DIAGNOSIS — H43393 Other vitreous opacities, bilateral: Secondary | ICD-10-CM | POA: Diagnosis not present

## 2022-04-04 DIAGNOSIS — H04123 Dry eye syndrome of bilateral lacrimal glands: Secondary | ICD-10-CM

## 2022-04-04 DIAGNOSIS — H35033 Hypertensive retinopathy, bilateral: Secondary | ICD-10-CM

## 2022-04-04 DIAGNOSIS — Z961 Presence of intraocular lens: Secondary | ICD-10-CM

## 2022-04-06 ENCOUNTER — Encounter (INDEPENDENT_AMBULATORY_CARE_PROVIDER_SITE_OTHER): Payer: Self-pay | Admitting: Ophthalmology

## 2022-04-18 NOTE — Progress Notes (Addendum)
[facility] Note  05/02/2022    CHIEF COMPLAINT Patient presents for Retina Follow Up   HISTORY OF PRESENT ILLNESS: Andrew Mitchell is a 61 y.o. male who presents to the clinic today for:  HPI     Retina Follow Up   Patient presents with  Other.  In both eyes.  Duration of 4 weeks.  Since onset it is gradually improving.  I, the attending physician,  performed the HPI with the patient and updated documentation appropriately.        Comments   4 week follow up CME OU- Increased IOPs (36 and 38 states pt).  Seen Dr. [name] who took him off all drops and started Combigan BID OU. The dryness is "a lot better" since stopping Lotemax and Prolensa.       Last edited by [name], MD on 05/03/2022  1:33 AM.    Pt states he saw Dr. [name] on July 6 and his IOP was in the high 30's bc he had been out of his drops for a few days, he states Dr. [name] took him off all drops except Combigan BID OU, pt states dryness/irritation has improved since stopping the drops   Referring physician: [name], MD [address],  [place] [id number]  HISTORICAL INFORMATION:  Selected notes from the [place] Referred by Dr. [name] for eval of CME   CURRENT MEDICATIONS: Current Outpatient Medications (Ophthalmic Drugs)  Medication Sig   brimonidine-timolol (COMBIGAN) 0.2-0.5 % ophthalmic solution Place 1 drop into both eyes in the morning and at bedtime.   Bromfenac Sodium (PROLENSA) 0.07 % SOLN Place 1 drop into both eyes 4 (four) times daily. INSTILL 1 DROP IN RIGHT EYE FOUR TIMES DAILY (Patient not taking: Reported on 05/02/2022)   LOTEMAX SM 0.38 % GEL Place 1 drop into both eyes 2 (two) times daily. (Patient not taking: Reported on 05/02/2022)   loteprednol (LOTEMAX) 0.5 % ophthalmic suspension Place 1 drop into the right eye 3 (three) times daily.   prednisoLONE acetate (PRED FORTE) 1 % ophthalmic suspension Place  1 drop into the left eye 6 (six) times daily. (Patient not taking: Reported on 01/31/2022)   No current facility-administered medications for this visit. (Ophthalmic Drugs)   Current Outpatient Medications (Other)  Medication Sig   carbidopa-levodopa (SINEMET IR) 25-100 MG per tablet Take 0.5 tablets by mouth at bedtime. Take 1/2 to 1 tablet by mouth daily before bedtime.   clopidogrel (PLAVIX) 75 MG tablet TAKE 1 TABLET BY MOUTH EVERY DAY   ezetimibe (ZETIA) 10 MG tablet Take 1 tablet (10 mg total) by mouth daily.   famotidine (PEPCID) 40 MG tablet Take 40 mg by mouth at bedtime.   metoprolol tartrate (LOPRESSOR) 25 MG tablet TAKE 1 TABLET BY MOUTH 2 TIMES DAILY. PLEASE KEEP UPCOMING APPT IN [facility] WITH [facility]ITH FOR MORE REFILLS   Multiple Vitamin (MULTIVITAMIN WITH MINERALS) TABS tablet Take 1 tablet by mouth in the morning. Centrum Multivitamin   nitroGLYCERIN (NITROSTAT) 0.4 MG SL tablet Place 1 tablet (0.4 mg total) under the tongue every 5 (five) minutes x 3 doses as needed for chest pain.   rosuvastatin (CRESTOR) 5 MG tablet Take 1 tablet (5 mg total) by mouth daily.   zolpidem (AMBIEN) 5 MG tablet Take 10 mg by mouth at bedtime as needed for sleep.   No current facility-administered medications for this visit. (Other)   REVIEW OF SYSTEMS: ROS   Positive  for: Cardiovascular, Eyes, Psychiatric Negative for: Constitutional, Gastrointestinal, Neurological, Skin, Genitourinary, Musculoskeletal, HENT, Endocrine, Respiratory, Allergic/Imm, Heme/Lymph Last edited by Leonie Douglas, COA on 05/02/2022  7:57 AM.      ALLERGIES Allergies  Allergen Reactions   Shellfish Allergy Anaphylaxis    Just to shrimp   PAST MEDICAL HISTORY Past Medical History:  Diagnosis Date   Back pain    CAD (coronary artery disease)    a. 09/2018 Inf STEMI/PCI: LM nl, LAD min irregs, D1/2 min irregs, LCX 100ost/p thrombotic (2.75x35 Orsiro DES), RCA 40d, RPAV 90.   Carpal tunnel syndrome    DDD (degenerative  disc disease)    Diastolic dysfunction    a. 09/2018 Echo: EF 50-55%, prob inflat and antlat HK, Gr1 DD. Mildlly dil Ao root (11m. Nl RV fxn.   Hyperlipemia    Hypertension    Hypertensive retinopathy    OU   Mildly Dilated aortic root ([place]    a. 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 [place]eterson [place]   COLONOSCOPY     CORONARY ANGIOGRAPHY N/A 10/08/2018   Procedure: CORONARY ANGIOGRAPHY;  Surgeon: CoSherren MochaMD;  [facility];  Service: Cardiovascular;  Laterality: N/A;   CORONARY STENT INTERVENTION N/A 10/08/2018   Procedure: CORONARY STENT INTERVENTION;  Surgeon: CoSherren MochaMD;  [facility];  Service: Cardiovascular;  Laterality: N/A;   CORONARY/GRAFT ACUTE MI REVASCULARIZATION N/A 10/08/2018   Procedure: Coronary/Graft Acute MI Revascularization;  Surgeon: CoSherren MochaMD;  [facility];  Service: Cardiovascular;  Laterality: N/A;   EYE SURGERY Bilateral 2019   Cat Sx - Dr. KaVevelyn Royals KNEE ARTHROSCOPY     rightx2   PARS PLANA VITRECTOMY Left 08/30/2021   Procedure: PARS PLANA VITRECTOMY WITH 25 GAUGE;  Surgeon: ZaBernarda CaffeyMD;  [facility]: [facility]hthal[facility];  Laterality: Left;   PARS PLANA VITRECTOMY Right 10/11/2021   Procedure: TWOlivetITRECTOMY;  Surgeon: ZaBernarda CaffeyMD;  [facility]: [facility]hthal[facility];  Laterality: Right;   PHOTOCOAGULATION WITH LASER Left 08/30/2021   Procedure: PHOTOCOAGULATION WITH LASER;  Surgeon: ZaBernarda CaffeyMD;  [facility] Service: Ophthalmology;  Laterality: Left;   PHOTOCOAGULATION WITH LASER  Right 10/11/2021   Procedure: PHOTOCOAGULATION WITH LASER;  Surgeon: ZaBernarda CaffeyMD;  [facility]: [facility]hthal[facility];  Laterality: Right;   RIB RESECTION  2003   thorasic outlet syndrome-rt   SHOULDER ARTHROSCOPY     rightx2   TONSILLECTOMY     FAMILY HISTORY History reviewed. No pertinent family history.  SOCIAL HISTORY Social History   Tobacco Use   Smoking status: Never   Smokeless tobacco: Never  Vaping Use   Vaping Use: Never used  Substance Use Topics   Alcohol use: Yes    Comment: occ   Drug use: No       OPHTHALMIC EXAM:  Base Eye Exam     Visual Acuity (Snellen - Linear)       Right Left   Dist [place] 20/30 -1 20/30   Dist ph  20/25 +2 20/20  Tonometry (Tonopen, 8:03 AM)       Right Left   Pressure 17 13         Pupils       Dark Light Shape React APD   Right 5 5 Round NR None   Left 4 3 Round Minimal None         Neuro/Psych     Oriented x3: Yes   Mood/Affect: Normal         Dilation     Both eyes: 1.0% Mydriacyl, 2.5% Phenylephrine @ 8:03 AM           Slit Lamp and Fundus Exam     Slit Lamp Exam       Right Left   Lids/Lashes Dermatochalasis - upper lid, UL telangiectasia Dermatochalasis - upper lid   Conjunctiva/Sclera White and quiet, residual STK ST quad White and quiet   Cornea Mild arcus, well healed temporal cataract wounds, 1+fine PEE well healed temporal cataract wounds, 1-2+ fine PEE, mild arcus, mild tear film debris   Anterior Chamber Deep, trace cell/pigment Deep, trace cell/pigment   Iris Round and dilated Round and dilated   Lens MF PC IOL in perfect position with open PC MF PC IOL in perfect position with open PC   Anterior Vitreous Post vitrectomy, clear -- vitreous condensations and opacities gone post vitrectomy; clear; vitreous opacities gone         Fundus Exam       Right Left   Disc Pink and Sharp, Compact Pink and Sharp   C/D Ratio 0.3 0.5   Macula Flat, good foveal  reflex, mild RPE mottling and clumping, mild ERM, trace cystic changes, No heme Flat, good foveal reflex, mild RPE mottling and clumping, No heme or edema, trace cystic changes, trace ERM   Vessels mild attenuation attenuated, Tortuous   Periphery Attached, No heme, No RT/RD, 360 peripheral laser changes Attached, No heme, No RT/RD, good 360 laser changes           IMAGING AND PROCEDURES  Imaging and Procedures for 05/02/2022  OCT, Retina - OU - Both Eyes       Right Eye Quality was good. Central Foveal Thickness: 371. Progression has improved. Findings include normal foveal contour, no SRF, epiretinal membrane, intraretinal fluid (Interval improvement in cystic changes / IRF temporal fovea and mac, mild ERM).   Left Eye Quality was good. Central Foveal Thickness: 355. Progression has been stable. Findings include normal foveal contour, no IRF, no SRF (Trace cystic changes / IRF inferior macula).   Notes *Images captured and stored on drive  Diagnosis / Impression:  OD: Interval improvement in cystic changes / IRF temporal fovea and mac, mild ERM  RK:YHCWC cystic changes / IRF inferior macula   Clinical management:  See below  Abbreviations: NFP - Normal foveal profile. CME - cystoid macular edema. PED - pigment epithelial detachment. IRF - intraretinal fluid. SRF - subretinal fluid. EZ - ellipsoid zone. ERM - epiretinal membrane. ORA - outer retinal atrophy. ORT - outer retinal tubulation. SRHM - subretinal hyper-reflective material. IRHM - intraretinal hyper-reflective material            ASSESSMENT/PLAN:    ICD-10-CM   1. Cystoid macular edema of both eyes  H35.353 OCT, Retina - OU - Both Eyes    2. Vitreous syneresis of both eyes  H43.393     3. Vitreous floaters of both eyes  H43.393     4. Epiretinal membrane (ERM) of right eye  H35.371     5. Essential hypertension  I10     6. Hypertensive retinopathy of both eyes  H35.033     7. Pseudophakia, both eyes   Z96.1     8. Dry eyes  H04.123      1. CME OU  - initially diagnosed w/ CME OU by Dr. [name] on 08.16.21 and started on Durezol and BromSite TID OU  - s/p STK OD #1, 09.30.22, #2 (04.20.23)  - PF and Prolensa stopped by Dr. [name] on 7.6.23 -- also switched to Combigan BID OU  - today BCVA 20/25 OD (worse), 20/20 OS  - residual STK still present in ST quad  - OCT shows OD: interval improvement in cystic changes / IRF temporal fovea and mac, mild ERM; OS: tr cystic changes  - discussed possible need for long-term anti-inflammatory maintenance, but will continue to hold for now  - IOP 17,13 -- cont Combigan BID OU per Dr. [name]  - f/u 4 weeks, DFE OCT -- possible repeat STK  2,3. Vitreous syneresis / floaters OU  - prominent symptomatic floaters OU  - s/p YAG vitreolysis OD 3.4.2020 ([place])  - s/p PPV/EL OS, 11.17.22 - tapered off all drops, BCVA 20/20             - s/p PPV/EL OD, 12.29.22             - doing well -- floaters improved  - BCVA 20/25 OD, 20/20 OS -- mild post op CME             - IOP good at 17,13  - drop schedule as above  - cont AT QID OU for corneal dryness, affecting vision             - drop instructions reviewed   - 4 wks  4. Epiretinal membrane, right eye  - mild ERM -- first noted 3.30.23 visit -- stable today - BCVA 20/25 - asymptomatic, no metamorphopsia, just blurred vision - no indication for surgery at this time - monitor for now - f/u 3 mos -- DFE/OCT   5,6. Hypertensive retinopathy OU - discussed importance of tight BP control - monitor   7. Pseudophakia OU  - s/p CE/IOL OU 2019  - multifocal IOLs in perfect position, doing well  - monitor   8. Dry eyes OU - recommend artificial tears and lubricating ointment as needed   Ophthalmic Meds Ordered this visit:  No orders of the defined types were placed in this encounter.    Return for f/u 4-6 weeks, CME OU, DFE, OCT.  There are no Patient Instructions on file for  this visit.  This document serves as a record of services personally performed by Gardiner Sleeper, MD, PhD. It was created on their behalf by San Jetty. Owens Shark, OA an ophthalmic technician. The creation of this record is the provider's dictation and/or activities during the visit.    Electronically signed by: San Jetty. Owens Shark, [place] 07.06.2023 1:34 AM  Gardiner Sleeper, M.D., Ph.D. Diseases & Surgery of the Retina and Vitreous [facility]  I have reviewed the above documentation for accuracy and completeness, and I agree with the above. Gardiner Sleeper, M.D., Ph.D. 05/03/22 1:51 AM   Abbreviations: M myopia (nearsighted); A astigmatism; H hyperopia (farsighted); P presbyopia; Mrx spectacle prescription;  CTL contact lenses; OD right eye; OS left eye; OU both eyes  XT exotropia; ET esotropia; PEK punctate epithelial keratitis; PEE punctate epithelial erosions; DES  dry eye syndrome; MGD meibomian gland dysfunction; ATs artificial tears; PFAT's preservative free artificial tears; [place] nuclear sclerotic cataract; PSC posterior subcapsular cataract; ERM epi-retinal membrane; PVD posterior vitreous detachment; RD retinal detachment; DM diabetes mellitus; DR diabetic retinopathy; NPDR non-proliferative diabetic retinopathy; PDR proliferative diabetic retinopathy; CSME clinically significant macular edema; DME diabetic macular edema; dbh dot blot hemorrhages; CWS cotton wool spot; POAG primary open angle glaucoma; C/D cup-to-disc ratio; HVF humphrey visual field; GVF goldmann visual field; OCT optical coherence tomography; IOP intraocular pressure; BRVO Branch retinal vein occlusion; CRVO central retinal vein occlusion; CRAO central retinal artery occlusion; BRAO branch retinal artery occlusion; RT retinal tear; SB scleral buckle; PPV pars plana vitrectomy; VH Vitreous hemorrhage; PRP panretinal laser photocoagulation; IVK intravitreal kenalog; VMT vitreomacular traction; MH Macular hole;  NVD  neovascularization of the disc; NVE neovascularization elsewhere; AREDS age related eye disease study; ARMD age related macular degeneration; POAG primary open angle glaucoma; EBMD epithelial/anterior basement membrane dystrophy; ACIOL anterior chamber intraocular lens; IOL intraocular lens; PCIOL posterior chamber intraocular lens; Phaco/IOL phacoemulsification with intraocular lens placement; [place] photorefractive keratectomy; LASIK laser assisted in situ keratomileusis; HTN hypertension; DM diabetes mellitus; COPD chronic obstructive pulmonary disease

## 2022-05-02 ENCOUNTER — Encounter (INDEPENDENT_AMBULATORY_CARE_PROVIDER_SITE_OTHER): Payer: 59 | Admitting: Ophthalmology

## 2022-05-02 ENCOUNTER — Encounter (INDEPENDENT_AMBULATORY_CARE_PROVIDER_SITE_OTHER): Payer: Self-pay | Admitting: Ophthalmology

## 2022-05-02 ENCOUNTER — Ambulatory Visit (INDEPENDENT_AMBULATORY_CARE_PROVIDER_SITE_OTHER): Payer: 59 | Admitting: Ophthalmology

## 2022-05-02 DIAGNOSIS — H35033 Hypertensive retinopathy, bilateral: Secondary | ICD-10-CM | POA: Diagnosis not present

## 2022-05-02 DIAGNOSIS — H35353 Cystoid macular degeneration, bilateral: Secondary | ICD-10-CM

## 2022-05-02 DIAGNOSIS — H35371 Puckering of macula, right eye: Secondary | ICD-10-CM

## 2022-05-02 DIAGNOSIS — I10 Essential (primary) hypertension: Secondary | ICD-10-CM

## 2022-05-02 DIAGNOSIS — Z961 Presence of intraocular lens: Secondary | ICD-10-CM

## 2022-05-02 DIAGNOSIS — H04123 Dry eye syndrome of bilateral lacrimal glands: Secondary | ICD-10-CM

## 2022-05-02 DIAGNOSIS — H43393 Other vitreous opacities, bilateral: Secondary | ICD-10-CM

## 2022-05-23 NOTE — Progress Notes (Signed)
[facility] Note  06/06/2022    CHIEF COMPLAINT Patient presents for Retina Follow Up   HISTORY OF PRESENT ILLNESS: Andrew Mitchell is a 61 y.o. male who presents to the clinic today for:  HPI     Retina Follow Up   Patient presents with  Other.  In both eyes.  Severity is moderate.  Duration of 5 weeks.  Since onset it is stable.  I, the attending physician,  performed the HPI with the patient and updated documentation appropriately.        Comments   Pt here for 5 wk ret f/u CME OU. Pt states VA is the same. Still some dryness present but that has improved.       Last edited by [name], MD on 06/06/2022  8:38 AM.    Pt states    Referring physician: [name], MD [place],  [place] [address]  HISTORICAL INFORMATION:  Selected notes from the MEDICAL RECORD NUMBER Referred by Dr. [name] for eval of CME   CURRENT MEDICATIONS: Current Outpatient Medications (Ophthalmic Drugs)  Medication Sig   brimonidine-timolol (COMBIGAN) 0.2-0.5 % ophthalmic solution Place 1 drop into both eyes in the morning and at bedtime.   Bromfenac Sodium (PROLENSA) 0.07 % SOLN Place 1 drop into the right eye 4 (four) times daily. INSTILL 1 DROP IN RIGHT EYE FOUR TIMES DAILY   LOTEMAX SM 0.38 % GEL Place 1 drop into both eyes 2 (two) times daily. (Patient not taking: Reported on 05/02/2022)   loteprednol (LOTEMAX) 0.5 % ophthalmic suspension Place 1 drop into the right eye 3 (three) times daily.   prednisoLONE acetate (PRED FORTE) 1 % ophthalmic suspension Place 1 drop into the left eye 6 (six) times daily. (Patient not taking: Reported on 01/31/2022)   No current facility-administered medications for this visit. (Ophthalmic Drugs)   Current Outpatient Medications (Other)  Medication Sig   carbidopa-levodopa (SINEMET IR) 25-100 MG per tablet Take 0.5 tablets by mouth at bedtime. Take 1/2 to 1 tablet by mouth daily before bedtime.    clopidogrel (PLAVIX) 75 MG tablet TAKE 1 TABLET BY MOUTH EVERY DAY   ezetimibe (ZETIA) 10 MG tablet Take 1 tablet (10 mg total) by mouth daily.   famotidine (PEPCID) 40 MG tablet Take 40 mg by mouth at bedtime.   metoprolol tartrate (LOPRESSOR) 25 MG tablet TAKE 1 TABLET BY MOUTH 2 TIMES DAILY. PLEASE KEEP UPCOMING APPT IN [facility] WITH SMITH FOR MORE REFILLS   Multiple Vitamin (MULTIVITAMIN WITH MINERALS) TABS tablet Take 1 tablet by mouth in the morning. Centrum Multivitamin   nitroGLYCERIN (NITROSTAT) 0.4 MG SL tablet Place 1 tablet (0.4 mg total) under the tongue every 5 (five) minutes x 3 doses as needed for chest pain.   rosuvastatin (CRESTOR) 5 MG tablet Take 1 tablet (5 mg total) by mouth daily.   zolpidem (AMBIEN) 5 MG tablet Take 10 mg by mouth at bedtime as needed for sleep.   No current facility-administered medications for this visit. (Other)   REVIEW OF SYSTEMS: ROS   Positive for: Cardiovascular, Eyes, Psychiatric Negative for: Constitutional, Gastrointestinal, Neurological, Skin, Genitourinary, Musculoskeletal, HENT, Endocrine, Respiratory, Allergic/Imm, Heme/Lymph Last edited by [name], COT on 06/06/2022  7:47 AM.     ALLERGIES Allergies  Allergen Reactions   Shellfish Allergy Anaphylaxis    Just to shrimp   PAST MEDICAL HISTORY Past Medical History:  Diagnosis Date   Back pain    CAD (  coronary artery disease)    a. 09/2018 Inf STEMI/PCI: LM nl, LAD min irregs, D1/2 min irregs, LCX 100ost/p thrombotic (2.75x35 Orsiro DES), RCA 40d, RPAV 90.   Carpal tunnel syndrome    DDD (degenerative disc disease)    Diastolic dysfunction    a. 09/2018 Echo: EF 50-55%, prob inflat and antlat HK, Gr1 DD. Mildlly dil Ao root (26m. Nl RV fxn.   Hyperlipemia    Hypertension    Hypertensive retinopathy    OU   Mildly Dilated aortic root ([facility]    a. 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 KaPeterson [place]   COLONOSCOPY     CORONARY ANGIOGRAPHY N/A 10/08/2018   Procedure: CORONARY ANGIOGRAPHY;  Surgeon: CoSherren MochaMD;  [facility];  Service: Cardiovascular;  Laterality: N/A;   CORONARY STENT INTERVENTION N/A 10/08/2018   Procedure: CORONARY STENT INTERVENTION;  Surgeon: CoSherren MochaMD;  [facility];  Service: Cardiovascular;  Laterality: N/A;   CORONARY/GRAFT ACUTE MI REVASCULARIZATION N/A 10/08/2018   Procedure: Coronary/Graft Acute MI Revascularization;  Surgeon: CoSherren MochaMD;  [facility];  Service: Cardiovascular;  Laterality: N/A;   EYE SURGERY Bilateral 2019   Cat Sx - Dr. KaVevelyn Royals KNEE ARTHROSCOPY     rightx2   PARS PLANA VITRECTOMY Left 08/30/2021   Procedure: PARS PLANA VITRECTOMY WITH 25 GAUGE;  Surgeon: ZaBernarda CaffeyMD;  [facility]: Ophthalmology;  Laterality: Left;   PARS PLANA VITRECTOMY Right 10/11/2021   Procedure: [place]OMY;  Surgeon: ZaBernarda CaffeyMD;  [place] [facility]: [place]hthalmology;  Laterality: Right;   PHOTOCOAGULATION WITH LASER Left 08/30/2021   Procedure: PHOTOCOAGULATION WITH LASER;  Surgeon: ZaBernarda CaffeyMD;  [facility]: Ophthalmology;  Laterality: Left;   PHOTOCOAGULATION WITH LASER Right 10/11/2021   Procedure: PHOTOCOAGULATION WITH LASER;  Surgeon: ZaBernarda CaffeyMD;  [place]: [facility];  Laterality: Right;   RIB RESECTION  2003   thorasic outlet syndrome-rt   SHOULDER ARTHROSCOPY     rightx2   TONSILLECTOMY     FAMILY HISTORY History reviewed. No pertinent family history.  SOCIAL HISTORY Social History   Tobacco Use    Smoking status: Never   Smokeless tobacco: Never  Vaping Use   Vaping Use: Never used  Substance Use Topics   Alcohol use: Yes    Comment: occ   Drug use: No       OPHTHALMIC EXAM:  Base Eye Exam     Visual Acuity (Snellen - Linear)       Right Left   Dist [place] 20/25 -2 20/20   Dist ph [place] NI          Tonometry (Tonopen, 7:52 AM)       Right Left   Pressure 18 13         Pupils       Dark Light Shape React APD   Right 4 4 Round NR None   Left 3 2 Round Minimal None         Visual Fields (Counting fingers)  Left Right    Full Full         Extraocular Movement       Right Left    Full, Ortho Full, Ortho         Neuro/Psych     Oriented x3: Yes   Mood/Affect: Normal         Dilation     Both eyes: 1.0% Mydriacyl, 2.5% Phenylephrine @ 7:53 AM           Slit Lamp and Fundus Exam     Slit Lamp Exam       Right Left   Lids/Lashes Dermatochalasis - upper lid, UL telangiectasia Dermatochalasis - upper lid   Conjunctiva/Sclera White and quiet, residual STK ST quad -- decreased White and quiet   Cornea Mild arcus, well healed temporal cataract wounds, trace PEE inferiorly, trace endo pigment well healed temporal cataract wounds, 1+ fine PEE, mild arcus, mild tear film debris   Anterior Chamber Deep, trace cell/pigment Deep, trace cell/pigment   Iris Round and dilated Round and dilated   Lens MF PC IOL in perfect position with open PC MF PC IOL in perfect position with open PC   Anterior Vitreous Post vitrectomy, clear -- vitreous condensations and opacities gone post vitrectomy; clear; vitreous opacities gone         Fundus Exam       Right Left   Disc Pink and Sharp, Compact Pink and Sharp   C/D Ratio 0.3 0.5   Macula Flat, good foveal reflex, mild RPE mottling and clumping, mild ERM, trace cystic changes, No heme Flat, good foveal reflex, mild RPE mottling and clumping, No heme or edema, trace ERM   Vessels mild attenuation  attenuated, Tortuous   Periphery Attached, No heme, No RT/RD, 360 peripheral laser changes Attached, No heme, No RT/RD, good 360 laser changes           IMAGING AND PROCEDURES  Imaging and Procedures for 06/06/2022  OCT, Retina - OU - Both Eyes       Right Eye Quality was good. Central Foveal Thickness: 374. Progression has worsened. Findings include normal foveal contour, no SRF, epiretinal membrane, intraretinal fluid (Mild interval increase in cystic changes / IRF temporal fovea and mac, mild ERM).   Left Eye Quality was good. Central Foveal Thickness: 358. Progression has been stable. Findings include normal foveal contour, no IRF, no SRF.   Notes *Images captured and stored on drive  Diagnosis / Impression:  OD: Mild interval increase in cystic changes / IRF temporal fovea and mac, mild ERM OS: NFP; no IRF/SRF -- stable  Clinical management:  See below  Abbreviations: NFP - Normal foveal profile. CME - cystoid macular edema. PED - pigment epithelial detachment. IRF - intraretinal fluid. SRF - subretinal fluid. EZ - ellipsoid zone. ERM - epiretinal membrane. ORA - outer retinal atrophy. ORT - outer retinal tubulation. SRHM - subretinal hyper-reflective material. IRHM - intraretinal hyper-reflective material      Injection into Tenon's Capsule - OD - Right Eye       Time Out 06/06/2022. 8:22 AM. Confirmed correct patient, procedure, site, and patient consented.   Anesthesia Topical anesthesia was used. Anesthetic medications included Lidocaine 2%, Proparacaine 0.5%.   Procedure Preparation included 5% betadine to ocular surface, eyelid speculum. A (25g) needle was used.   Injection: 40 mg triamcinolone acetonide 40 MG/ML   Route: Intravitreal, Site: Right Eye   NDC: [phone], Lot: [id number], Expiration date: 09/13/2023   Post-op Post injection  exam found visual acuity of at least counting fingers. The patient tolerated the procedure well. There were no  complications. The patient received written and verbal post procedure care education. Post injection medications were not given.   Notes 0.9 cc of Kenalog-40 (36 mg) was injected into subtenon's capsule in the superotemporal quadrant. Betadine was applied to Injection area pre and post-injection then rinsed with sterile BSS. 1 drop of ofloxacin was instilled into the eye. There were no complications. Pt tolerated procedure well.            ASSESSMENT/PLAN:    ICD-10-CM   1. Cystoid macular edema of both eyes  H35.353 OCT, Retina - OU - Both Eyes    Injection into Tenon's Capsule - OD - Right Eye    triamcinolone acetonide (KENALOG-40) injection 40 mg    2. Vitreous syneresis of both eyes  H43.393     3. Vitreous floaters of both eyes  H43.393     4. Epiretinal membrane (ERM) of right eye  H35.371     5. Essential hypertension  I10     6. Hypertensive retinopathy of both eyes  H35.033     7. Pseudophakia, both eyes  Z96.1     8. Dry eyes  H04.123     9. Cystoid macular edema of right eye  H35.351      1. CME OU  - initially diagnosed w/ CME OU by Dr. [name] on 08.16.21 and started on Durezol and BromSite TID OU  - s/p STK OD #1, (09.30.22), #2 (04.20.23)  - PF and Prolensa stopped by Dr. [name] on 7.6.23 -- also switched to Combigan BID OU due to steroid-induced ocular hypertension  - today BCVA 20/25 OD (stable), 20/20 OS  - OCT shows OD: mild interval increase in cystic changes / IRF temporal fovea and mac, mild ERM  - discussed possible need for long-term anti-inflammatory maintenance  - recommend STK OD #3 today and re-starting Prolensa QID OD only -- will hold topical steroids  - pt in agreement  - RBA of procedure discussed, questions answered - STK informed consent obtained and signed, 08.24.23 (OD) - see procedure note  - IOP 18,13 -- cont Combigan BID OU per Dr. [name]  - f/u 4 weeks -- DFE OCT   2,3. Vitreous syneresis / floaters OU  -  prominent symptomatic floaters OU  - s/p YAG vitreolysis OD 3.4.2020 ([place])  - s/p PPV/EL OS, 11.17.22 - tapered off all drops, BCVA 20/20             - s/p PPV/EL OD, 12.29.22             - doing well -- floaters improved  - BCVA 20/25 OD, 20/20 OS -- mild post op CME             - IOP good at 18,13  - drop schedule as above  - cont AT QID OU for corneal dryness, affecting vision             - drop instructions reviewed   - 4 wks  4. Epiretinal membrane, right eye  - mild ERM -- first noted 3.30.23 visit -- stable today - BCVA 20/25 - asymptomatic, no metamorphopsia, just blurred vision - no indication for surgery at this time - monitor for now - f/u 3 mos -- DFE/OCT   5,6. Hypertensive retinopathy OU - discussed importance of tight BP control - monitor   7. Pseudophakia OU  - s/p CE/IOL OU 2019  -  multifocal IOLs in perfect position, doing well  - monitor   8. Dry eyes OU - recommend artificial tears and lubricating ointment as needed   Ophthalmic Meds Ordered this visit:  Meds ordered this encounter  Medications   Bromfenac Sodium (PROLENSA) 0.07 % SOLN    Sig: Place 1 drop into the right eye 4 (four) times daily. INSTILL 1 DROP IN RIGHT EYE FOUR TIMES DAILY    Dispense:  3 mL    Refill:  6   triamcinolone acetonide (KENALOG-40) injection 40 mg     Return in about 4 weeks (around 07/04/2022) for f/u CME OD, DFE, OCT.  There are no Patient Instructions on file for this visit.  This document serves as a record of services personally performed by Gardiner Sleeper, MD, PhD. It was created on their behalf by San Jetty. Owens Shark, OA an ophthalmic technician. The creation of this record is the provider's dictation and/or activities during the visit.    Electronically signed by: San Jetty. Owens Shark, [place] 08.10.2023 8:40 AM   Gardiner Sleeper, M.D., Ph.D. Diseases & Surgery of the Retina and Vitreous [facility]  I have reviewed the above  documentation for accuracy and completeness, and I agree with the above. Gardiner Sleeper, M.D., Ph.D. 06/06/22 8:40 AM   Abbreviations: M myopia (nearsighted); A astigmatism; H hyperopia (farsighted); P presbyopia; Mrx spectacle prescription;  CTL contact lenses; OD right eye; OS left eye; OU both eyes  XT exotropia; ET esotropia; PEK punctate epithelial keratitis; PEE punctate epithelial erosions; DES dry eye syndrome; MGD meibomian gland dysfunction; ATs artificial tears; PFAT's preservative free artificial tears; [place] nuclear sclerotic cataract; PSC posterior subcapsular cataract; ERM epi-retinal membrane; PVD posterior vitreous detachment; RD retinal detachment; DM diabetes mellitus; DR diabetic retinopathy; NPDR non-proliferative diabetic retinopathy; PDR proliferative diabetic retinopathy; CSME clinically significant macular edema; DME diabetic macular edema; dbh dot blot hemorrhages; CWS cotton wool spot; POAG primary open angle glaucoma; C/D cup-to-disc ratio; HVF humphrey visual field; GVF goldmann visual field; OCT optical coherence tomography; IOP intraocular pressure; BRVO Branch retinal vein occlusion; CRVO central retinal vein occlusion; CRAO central retinal artery occlusion; BRAO branch retinal artery occlusion; RT retinal tear; SB scleral buckle; PPV pars plana vitrectomy; VH Vitreous hemorrhage; PRP panretinal laser photocoagulation; IVK intravitreal kenalog; VMT vitreomacular traction; MH Macular hole;  NVD neovascularization of the disc; NVE neovascularization elsewhere; AREDS age related eye disease study; ARMD age related macular degeneration; POAG primary open angle glaucoma; EBMD epithelial/anterior basement membrane dystrophy; ACIOL anterior chamber intraocular lens; IOL intraocular lens; PCIOL posterior chamber intraocular lens; Phaco/IOL phacoemulsification with intraocular lens placement; [place] photorefractive keratectomy; LASIK laser assisted in situ keratomileusis; HTN hypertension; DM  diabetes mellitus; COPD chronic obstructive pulmonary disease

## 2022-06-06 ENCOUNTER — Encounter (INDEPENDENT_AMBULATORY_CARE_PROVIDER_SITE_OTHER): Payer: Self-pay | Admitting: Ophthalmology

## 2022-06-06 ENCOUNTER — Other Ambulatory Visit (INDEPENDENT_AMBULATORY_CARE_PROVIDER_SITE_OTHER): Payer: Self-pay | Admitting: Ophthalmology

## 2022-06-06 ENCOUNTER — Ambulatory Visit (INDEPENDENT_AMBULATORY_CARE_PROVIDER_SITE_OTHER): Payer: 59 | Admitting: Ophthalmology

## 2022-06-06 DIAGNOSIS — H35033 Hypertensive retinopathy, bilateral: Secondary | ICD-10-CM

## 2022-06-06 DIAGNOSIS — I10 Essential (primary) hypertension: Secondary | ICD-10-CM | POA: Diagnosis not present

## 2022-06-06 DIAGNOSIS — Z961 Presence of intraocular lens: Secondary | ICD-10-CM

## 2022-06-06 DIAGNOSIS — H35351 Cystoid macular degeneration, right eye: Secondary | ICD-10-CM

## 2022-06-06 DIAGNOSIS — H35353 Cystoid macular degeneration, bilateral: Secondary | ICD-10-CM

## 2022-06-06 DIAGNOSIS — H43393 Other vitreous opacities, bilateral: Secondary | ICD-10-CM | POA: Diagnosis not present

## 2022-06-06 DIAGNOSIS — H04123 Dry eye syndrome of bilateral lacrimal glands: Secondary | ICD-10-CM

## 2022-06-06 DIAGNOSIS — H35371 Puckering of macula, right eye: Secondary | ICD-10-CM | POA: Diagnosis not present

## 2022-06-06 MED ORDER — PROLENSA 0.07 % OP SOLN: 1.0000 [drp] | Freq: Four times a day (QID) | OPHTHALMIC | 6 refills | Status: DC

## 2022-06-06 MED ORDER — TRIAMCINOLONE ACETONIDE 40 MG/ML IJ SUSP FOR KALEIDOSCOPE
40.0000 mg | INTRAMUSCULAR | Status: AC | PRN
  Administered 2022-06-06: 40 mg via INTRAVITREAL

## 2022-07-04 NOTE — Progress Notes (Signed)
[facility] Note  07/10/2022    CHIEF COMPLAINT Patient presents for Retina Follow Up   HISTORY OF PRESENT ILLNESS: Andrew Mitchell is a 61 y.o. male who presents to the clinic today for:  HPI     Retina Follow Up   Patient presents with  Other.  In both eyes.  This started months ago.  Severity is moderate.  Duration of 4 weeks.  Since onset it is stable.  I, the attending physician,  performed the HPI with the patient and updated documentation appropriately.        Comments   Patient feels that the vision has not been as good in the past week.       Last edited by [name], MD on 07/10/2022  8:51 AM.    Pt feels like right eye vision in the right eye has been worse over the past week, pt went to an optometrist yesterday and ordered new glasses, pt is using Diclofenac QID OD -- pharmacy switched from [facility]   Referring physician: [name], MD [place],  [place] [id number]  HISTORICAL INFORMATION:  Selected notes from the [place] Referred by Dr. [name] for eval of CME   CURRENT MEDICATIONS: Current Outpatient Medications (Ophthalmic Drugs)  Medication Sig   Bromfenac Sodium (PROLENSA) 0.07 % SOLN Place 1 drop into the right eye 4 (four) times daily.   brimonidine-timolol (COMBIGAN) 0.2-0.5 % ophthalmic solution Place 1 drop into both eyes in the morning and at bedtime.   diclofenac (VOLTAREN) 0.1 % ophthalmic solution Place 1 drop into the right eye 4 (four) times daily.   LOTEMAX SM 0.38 % GEL Place 1 drop into both eyes 2 (two) times daily. (Patient not taking: Reported on 05/02/2022)   loteprednol (LOTEMAX) 0.5 % ophthalmic suspension Place 1 drop into the right eye 3 (three) times daily.   prednisoLONE acetate (PRED FORTE) 1 % ophthalmic suspension Place 1 drop into the left eye 6 (six) times daily. (Patient not taking: Reported on 01/31/2022)   No current facility-administered medications for this  visit. (Ophthalmic Drugs)   Current Outpatient Medications (Other)  Medication Sig   carbidopa-levodopa (SINEMET IR) 25-100 MG per tablet Take 0.5 tablets by mouth at bedtime. Take 1/2 to 1 tablet by mouth daily before bedtime.   clopidogrel (PLAVIX) 75 MG tablet TAKE 1 TABLET BY MOUTH EVERY DAY   ezetimibe (ZETIA) 10 MG tablet Take 1 tablet (10 mg total) by mouth daily.   famotidine (PEPCID) 40 MG tablet Take 40 mg by mouth at bedtime.   metoprolol tartrate (LOPRESSOR) 25 MG tablet TAKE 1 TABLET BY MOUTH 2 TIMES DAILY. PLEASE KEEP UPCOMING APPT IN [facility] WITH [facility]ITH FOR MORE REFILLS   Multiple Vitamin (MULTIVITAMIN WITH MINERALS) TABS tablet Take 1 tablet by mouth in the morning. Centrum Multivitamin   nitroGLYCERIN (NITROSTAT) 0.4 MG SL tablet Place 1 tablet (0.4 mg total) under the tongue every 5 (five) minutes x 3 doses as needed for chest pain.   rosuvastatin (CRESTOR) 5 MG tablet Take 1 tablet (5 mg total) by mouth daily.   zolpidem (AMBIEN) 5 MG tablet Take 10 mg by mouth at bedtime as needed for sleep.   No current facility-administered medications for this visit. (Other)   REVIEW OF SYSTEMS: ROS   Positive for: Cardiovascular, Eyes, Psychiatric Negative for: Constitutional, Gastrointestinal, Neurological, Skin, Genitourinary, Musculoskeletal, HENT, Endocrine, Respiratory, Allergic/Imm, Heme/Lymph Last edited by Annie Paras, COT on 07/10/2022  8:04 AM.     ALLERGIES Allergies  Allergen Reactions   Shellfish Allergy Anaphylaxis    Just to shrimp   PAST MEDICAL HISTORY Past Medical History:  Diagnosis Date   Back pain    CAD (coronary artery disease)    a. 09/2018 Inf STEMI/PCI: LM nl, LAD min irregs, D1/2 min irregs, LCX 100ost/p thrombotic (2.75x35 Orsiro DES), RCA 40d, RPAV 90.   Carpal tunnel syndrome    DDD (degenerative disc disease)    Diastolic dysfunction    a. 09/2018 Echo: EF 50-55%, prob inflat and antlat HK, Gr1 DD. Mildlly dil Ao root (85m. Nl RV fxn.    Hyperlipemia    Hypertension    Hypertensive retinopathy    OU   Mildly Dilated aortic root ([place]    a. 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 [place]eterson [place]   COLONOSCOPY     CORONARY ANGIOGRAPHY N/A 10/08/2018   Procedure: CORONARY ANGIOGRAPHY;  Surgeon: CoSherren MochaMD;  [facility];  Service: Cardiovascular;  Laterality: N/A;   CORONARY STENT INTERVENTION N/A 10/08/2018   Procedure: CORONARY STENT INTERVENTION;  Surgeon: CoSherren MochaMD;  [facility];  Service: Cardiovascular;  Laterality: N/A;   CORONARY/GRAFT ACUTE MI REVASCULARIZATION N/A 10/08/2018   Procedure: Coronary/Graft Acute MI Revascularization;  Surgeon: CoSherren MochaMD;  [place];  Service: Cardiovascular;  Laterality: N/A;   EYE SURGERY Bilateral 2019   Cat Sx - Dr. KaVevelyn Royals KNEE ARTHROSCOPY     rightx2   PARS PLANA VITRECTOMY Left 08/30/2021   Procedure: PARS PLANA VITRECTOMY WITH 25 GAUGE;  Surgeon: ZaBernarda CaffeyMD;  [place] [place];  Laterality: Left;   PARS PLANA VITRECTOMY Right 10/11/2021   Procedure: T[place]abec[place]RECTOMY;  Surgeon: ZaBernarda CaffeyMD;  [place] [facility] [place]hthal[facility];  Laterality: Right;   PHOTOCOAGULATION WITH LASER Left 08/30/2021   Procedure: PHOTOCOAGULATION WITH LASER;  Surgeon: ZaBernarda CaffeyMD;  [place] [facility];  Laterality: Left;   PHOTOCOAGULATION WITH LASER Right 10/11/2021   Procedure: PHOTOCOAGULATION WITH LASER;  Surgeon: ZaBernarda CaffeyMD;  [place] [facility] [place] Service: Ophthalmology;  Laterality:  Right;   RIB RESECTION  2003   thorasic outlet syndrome-rt   SHOULDER ARTHROSCOPY     rightx2   TONSILLECTOMY     FAMILY HISTORY History reviewed. No pertinent family history.  SOCIAL HISTORY Social History   Tobacco Use   Smoking status: Never   Smokeless tobacco: Never  Vaping Use   Vaping Use: Never used  Substance Use Topics   Alcohol use: Yes    Comment: occ   Drug use: No       OPHTHALMIC EXAM:  Base Eye Exam     Visual Acuity (Snellen - Linear)       Right Left   Dist [place] 20/30 +2 20/25 +1   Dist ph [place] 20/20 +2 20/20         Tonometry (Tonopen, 8:08 AM)       Right Left   Pressure 12 11  Pupils       Dark Light Shape React APD   Right 4 4 Round NR None   Left 3 2 Round Minimal None         Visual Fields       Left Right    Full Full         Extraocular Movement       Right Left    Full, Ortho Full, Ortho         Neuro/Psych     Oriented x3: Yes   Mood/Affect: Normal         Dilation     Both eyes: 1.0% Mydriacyl, 2.5% Phenylephrine @ 8:05 AM           Slit Lamp and Fundus Exam     Slit Lamp Exam       Right Left   Lids/Lashes Dermatochalasis - upper lid, UL telangiectasia Dermatochalasis - upper lid   Conjunctiva/Sclera White and quiet, residual STK ST quad, Subconjunctival hemorrhage ST quad White and quiet   Cornea Mild arcus, well healed temporal cataract wounds, 2+PEE, trace endo pigment well healed temporal cataract wounds, trace PEE, mild arcus, mild tear film debris, trace, fine endo pigment   Anterior Chamber Deep, trace cell/pigment Deep, trace cell/pigment   Iris Round and dilated Round and dilated   Lens MF PC IOL in perfect position with open PC MF PC IOL in perfect position with open PC   Anterior Vitreous Post vitrectomy, clear -- vitreous condensations and opacities gone post vitrectomy; clear; vitreous opacities gone         Fundus Exam       Right Left   Disc Pink and Sharp,  Compact Pink and Sharp   C/D Ratio 0.3 0.5   Macula Flat, good foveal reflex, mild RPE mottling and clumping, mild ERM, trace cystic changes -- slightly improved, No heme Flat, good foveal reflex, mild RPE mottling and clumping, No heme or edema, trace ERM   Vessels mild attenuation attenuated, Tortuous   Periphery Attached, No heme, No RT/RD, 360 peripheral laser changes Attached, No heme, No RT/RD, good 360 laser changes           IMAGING AND PROCEDURES  Imaging and Procedures for 07/10/2022  OCT, Retina - OU - Both Eyes       Right Eye Quality was good. Central Foveal Thickness: 386. Progression has improved. Findings include normal foveal contour, no SRF, epiretinal membrane, intraretinal fluid (Mild interval improvement in cystic changes / IRF temporal fovea and mac, mild ERM).   Left Eye Quality was good. Central Foveal Thickness: 358. Progression has been stable. Findings include normal foveal contour, no IRF, no SRF (Trace cystic changes inferior macula).   Notes *Images captured and stored on drive  Diagnosis / Impression:  OD: Mild interval improvement in cystic changes / IRF temporal fovea and mac, mild ERM OS: NFP; no IRF/SRF -- Trace cystic changes inferior macula  Clinical management:  See below  Abbreviations: NFP - Normal foveal profile. CME - cystoid macular edema. PED - pigment epithelial detachment. IRF - intraretinal fluid. SRF - subretinal fluid. EZ - ellipsoid zone. ERM - epiretinal membrane. ORA - outer retinal atrophy. ORT - outer retinal tubulation. SRHM - subretinal hyper-reflective material. IRHM - intraretinal hyper-reflective material            ASSESSMENT/PLAN:    ICD-10-CM   1. Cystoid macular edema of both eyes  H35.353 OCT, Retina - OU - Both Eyes  2. Vitreous syneresis of both eyes  H43.393     3. Vitreous floaters of both eyes  H43.393     4. Epiretinal membrane (ERM) of right eye  H35.371     5. Essential hypertension  I10      6. Hypertensive retinopathy of both eyes  H35.033     7. Pseudophakia, both eyes  Z96.1     8. Dry eyes  H04.123       1. CME OU  - initially diagnosed w/ CME OU by Dr. [name] on 08.16.21 and started on Durezol and BromSite TID OU  - s/p STK OD #1, (09.30.22), #2 (04.20.23), #3 (08.24.23)  - PF and Prolensa stopped by Dr. [name] on 7.6.23 -- also switched to Combigan BID OU due to steroid-induced ocular hypertension  - today BCVA 20/20 OD (improved), 20/20 OS  - OCT shows OD: Mild interval improvement in cystic changes / IRF temporal fovea and mac, mild ERM; OS: trace cystic changes inferior macula  - discussed possible need for long-term anti-inflammatory maintenance  - pt was switched to Diclofenac by pharmacy -- recommend switching back to Prolensa QID OD only due to keratopathy induced by diclofenac - STK informed consent obtained and signed, 08.24.23 (OD)  - IOP 12,11 -- cont Combigan BID OU per Dr. [name]  - f/u 4-6 weeks -- DFE OCT   2,3. Vitreous syneresis / floaters OU  - prominent symptomatic floaters OU  - s/p YAG vitreolysis OD 3.4.2020 ([place])  - s/p PPV/EL OS, 11.17.22 - tapered off all drops, BCVA 20/20             - s/p PPV/EL OD, 12.29.22             - doing well -- floaters improved  - BCVA 20/20 OU -- mild post op CME             - IOP good at 12,11  - drop schedule as above  - cont AT QID OU for corneal dryness, affecting vision             - drop instructions reviewed   - 4 weeks  4. Epiretinal membrane, right eye  - mild ERM -- first noted 3.30.23 visit -- stable today - BCVA 20/20 - asymptomatic, no metamorphopsia, just blurred vision - no indication for surgery at this time - monitor for now - f/u 3 months DFE, OCT  5,6. Hypertensive retinopathy OU - discussed importance of tight BP control - monitor  7. Pseudophakia OU  - s/p CE/IOL OU 2019  - multifocal IOLs in perfect position, doing well  - monitor  8. Dry eyes OU -  recommend artificial tears and lubricating ointment as needed - worse today OD due to diclofenac-induced keratopathy  Ophthalmic Meds Ordered this visit:  Meds ordered this encounter  Medications   Bromfenac Sodium (PROLENSA) 0.07 % SOLN    Sig: Place 1 drop into the right eye 4 (four) times daily.    Dispense:  6 mL    Refill:  3     Return for f/u 4-6 weeks, CME OD, DFE, OCT.  There are no Patient Instructions on file for this visit.  This document serves as a record of services personally performed by Gardiner Sleeper, MD, PhD. It was created on their behalf by Roselee Nova, COMT. The creation of this record is the provider's dictation and/or activities during the visit.  Electronically signed by: Roselee Nova, COMT 07/10/22 8:55 AM  This document  serves as a record of services personally performed by Gardiner Sleeper, MD, PhD. It was created on their behalf by San Jetty. Owens Shark, OA an ophthalmic technician. The creation of this record is the provider's dictation and/or activities during the visit.    Electronically signed by: San Jetty. Owens Shark, [place] 09.27.2023 8:55 AM  Gardiner Sleeper, M.D., Ph.D. Diseases & Surgery of the Retina and Vitreous [facility]  I have reviewed the above documentation for accuracy and completeness, and I agree with the above. Gardiner Sleeper, M.D., Ph.D. 07/10/22 8:55 AM  Abbreviations: M myopia (nearsighted); A astigmatism; H hyperopia (farsighted); P presbyopia; Mrx spectacle prescription;  CTL contact lenses; OD right eye; OS left eye; OU both eyes  XT exotropia; ET esotropia; PEK punctate epithelial keratitis; PEE punctate epithelial erosions; DES dry eye syndrome; MGD meibomian gland dysfunction; ATs artificial tears; PFAT's preservative free artificial tears; [place] nuclear sclerotic cataract; PSC posterior subcapsular cataract; ERM epi-retinal membrane; PVD posterior vitreous detachment; RD retinal detachment; DM diabetes mellitus; DR  diabetic retinopathy; NPDR non-proliferative diabetic retinopathy; PDR proliferative diabetic retinopathy; CSME clinically significant macular edema; DME diabetic macular edema; dbh dot blot hemorrhages; CWS cotton wool spot; POAG primary open angle glaucoma; C/D cup-to-disc ratio; HVF humphrey visual field; GVF goldmann visual field; OCT optical coherence tomography; IOP intraocular pressure; BRVO Branch retinal vein occlusion; CRVO central retinal vein occlusion; CRAO central retinal artery occlusion; BRAO branch retinal artery occlusion; RT retinal tear; SB scleral buckle; PPV pars plana vitrectomy; VH Vitreous hemorrhage; PRP panretinal laser photocoagulation; IVK intravitreal kenalog; VMT vitreomacular traction; MH Macular hole;  NVD neovascularization of the disc; NVE neovascularization elsewhere; AREDS age related eye disease study; ARMD age related macular degeneration; POAG primary open angle glaucoma; EBMD epithelial/anterior basement membrane dystrophy; ACIOL anterior chamber intraocular lens; IOL intraocular lens; PCIOL posterior chamber intraocular lens; Phaco/IOL phacoemulsification with intraocular lens placement; [place] photorefractive keratectomy; LASIK laser assisted in situ keratomileusis; HTN hypertension; DM diabetes mellitus; COPD chronic obstructive pulmonary disease

## 2022-07-10 ENCOUNTER — Ambulatory Visit (INDEPENDENT_AMBULATORY_CARE_PROVIDER_SITE_OTHER): Payer: 59 | Admitting: Ophthalmology

## 2022-07-10 ENCOUNTER — Encounter (INDEPENDENT_AMBULATORY_CARE_PROVIDER_SITE_OTHER): Payer: Self-pay | Admitting: Ophthalmology

## 2022-07-10 DIAGNOSIS — I10 Essential (primary) hypertension: Secondary | ICD-10-CM | POA: Diagnosis not present

## 2022-07-10 DIAGNOSIS — H43393 Other vitreous opacities, bilateral: Secondary | ICD-10-CM | POA: Diagnosis not present

## 2022-07-10 DIAGNOSIS — H35033 Hypertensive retinopathy, bilateral: Secondary | ICD-10-CM

## 2022-07-10 DIAGNOSIS — H35371 Puckering of macula, right eye: Secondary | ICD-10-CM | POA: Diagnosis not present

## 2022-07-10 DIAGNOSIS — Z961 Presence of intraocular lens: Secondary | ICD-10-CM

## 2022-07-10 DIAGNOSIS — H35353 Cystoid macular degeneration, bilateral: Secondary | ICD-10-CM | POA: Diagnosis not present

## 2022-07-10 DIAGNOSIS — H04123 Dry eye syndrome of bilateral lacrimal glands: Secondary | ICD-10-CM

## 2022-07-10 MED ORDER — PROLENSA 0.07 % OP SOLN: 1.0000 [drp] | Freq: Four times a day (QID) | OPHTHALMIC | 3 refills | Status: AC

## 2022-08-06 NOTE — Progress Notes (Shared)
[facility] Note  08/14/2022    CHIEF COMPLAINT Patient presents for Retina Follow Up   HISTORY OF PRESENT ILLNESS: Andrew Mitchell is a 61 y.o. male who presents to the clinic today for:  HPI     Retina Follow Up   Patient presents with  Other.  In both eyes.  This started 5 weeks ago.  Severity is moderate.  Duration of 5 weeks.  Since onset it is stable.  I, the attending physician,  performed the HPI with the patient and updated documentation appropriately.        Comments   Retina follow up CME OU PT states vision is stable since his last visit       Last edited by [name], MD on 08/15/2022 11:29 AM.    Pt is using Combigan BID OU and diclofenac QID OU, pt felt like his vision was doing okay until he checked his vision here this morning, he got new glasses for driving, he has not been using AT's bc his eyes have not felt dry   Referring physician: [name], MD [place],  [place] [address]  HISTORICAL INFORMATION:  Selected notes from the [facility] Referred by Dr. [name] for eval of CME   CURRENT MEDICATIONS: Current Outpatient Medications (Ophthalmic Drugs)  Medication Sig   brimonidine-timolol (COMBIGAN) 0.2-0.5 % ophthalmic solution Place 1 drop into both eyes in the morning and at bedtime.   Bromfenac Sodium (PROLENSA) 0.07 % SOLN Place 1 drop into the right eye 4 (four) times daily.   diclofenac (VOLTAREN) 0.1 % ophthalmic solution Place 1 drop into the right eye 4 (four) times daily.   LOTEMAX SM 0.38 % GEL Place 1 drop into both eyes 2 (two) times daily. (Patient not taking: Reported on 05/02/2022)   loteprednol (LOTEMAX) 0.5 % ophthalmic suspension Place 1 drop into the right eye 3 (three) times daily.   prednisoLONE acetate (PRED FORTE) 1 % ophthalmic suspension Place 1 drop into the left eye 6 (six) times daily. (Patient not taking: Reported on 01/31/2022)   No current facility-administered  medications for this visit. (Ophthalmic Drugs)   Current Outpatient Medications (Other)  Medication Sig   carbidopa-levodopa (SINEMET IR) 25-100 MG per tablet Take 0.5 tablets by mouth at bedtime. Take 1/2 to 1 tablet by mouth daily before bedtime.   clopidogrel (PLAVIX) 75 MG tablet TAKE 1 TABLET BY MOUTH EVERY DAY   ezetimibe (ZETIA) 10 MG tablet Take 1 tablet (10 mg total) by mouth daily.   famotidine (PEPCID) 40 MG tablet Take 40 mg by mouth at bedtime.   metoprolol tartrate (LOPRESSOR) 25 MG tablet TAKE 1 TABLET BY MOUTH 2 TIMES DAILY. PLEASE KEEP UPCOMING APPT IN [facility] WITH [facility] FOR MORE REFILLS   Multiple Vitamin (MULTIVITAMIN WITH MINERALS) TABS tablet Take 1 tablet by mouth in the morning. Centrum Multivitamin   nitroGLYCERIN (NITROSTAT) 0.4 MG SL tablet Place 1 tablet (0.4 mg total) under the tongue every 5 (five) minutes x 3 doses as needed for chest pain.   rosuvastatin (CRESTOR) 5 MG tablet Take 1 tablet (5 mg total) by mouth daily.   zolpidem (AMBIEN) 5 MG tablet Take 10 mg by mouth at bedtime as needed for sleep.   No current facility-administered medications for this visit. (Other)   REVIEW OF SYSTEMS:   ALLERGIES Allergies  Allergen Reactions   Shellfish Allergy Anaphylaxis    Just to shrimp   PAST MEDICAL HISTORY Past  Medical History:  Diagnosis Date   Back pain    CAD (coronary artery disease)    a. 09/2018 Inf STEMI/PCI: LM nl, LAD min irregs, D1/2 min irregs, LCX 100ost/p thrombotic (2.75x35 Orsiro DES), RCA 40d, RPAV 90.   Carpal tunnel syndrome    DDD (degenerative disc disease)    Diastolic dysfunction    a. 09/2018 Echo: EF 50-55%, prob inflat and antlat HK, Gr1 DD. Mildlly dil Ao root (36m. Nl RV fxn.   Hyperlipemia    Hypertension    Hypertensive retinopathy    OU   Mildly Dilated aortic root ([place]    a. 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 KaPeterson [place]   COLONOSCOPY     CORONARY ANGIOGRAPHY N/A 10/08/2018   Procedure: CORONARY ANGIOGRAPHY;  Surgeon: CoSherren MochaMD;  [place];  Service: Cardiovascular;  Laterality: N/A;   CORONARY STENT INTERVENTION N/A 10/08/2018   Procedure: CORONARY STENT INTERVENTION;  Surgeon: CoSherren MochaMD;  [facility];  Service: Cardiovascular;  Laterality: N/A;   CORONARY/GRAFT ACUTE MI REVASCULARIZATION N/A 10/08/2018   Procedure: Coronary/Graft Acute MI Revascularization;  Surgeon: CoSherren MochaMD;  [place];  Service: Cardiovascular;  Laterality: N/A;   EYE SURGERY Bilateral 2019   Cat Sx - Dr. KaVevelyn Royals KNEE ARTHROSCOPY     rightx2   PARS PLANA VITRECTOMY Left 08/30/2021   Procedure: PARS PLANA VITRECTOMY WITH 25 GAUGE;  Surgeon: ZaBernarda CaffeyMD;  [place] [facility] Service: Ophthalmology;  Laterality: Left;   PARS PLANA VITRECTOMY Right 10/11/2021   Procedure: TWTennysonITRECTOMY;  Surgeon: ZaBernarda CaffeyMD;  [place] Service: Ophthalmology;  Laterality: Right;   PHOTOCOAGULATION WITH LASER Left 08/30/2021   Procedure: PHOTOCOAGULATION WITH LASER;  Surgeon: ZaBernarda CaffeyMD;  [place] [facility] [place] Service: Ophthalmology;  Laterality: Left;   PHOTOCOAGULATION WITH LASER Right 10/11/2021   Procedure: PHOTOCOAGULATION WITH LASER;  Surgeon: ZaBernarda CaffeyMD;  [place] [facility] Service: Ophthalmology;  Laterality: Right;   RIB RESECTION  2003   thorasic outlet syndrome-rt   SHOULDER ARTHROSCOPY     rightx2   TONSILLECTOMY     FAMILY HISTORY History reviewed. No pertinent family history.  SOCIAL HISTORY Social History   Tobacco Use   Smoking status:  Never   Smokeless tobacco: Never  Vaping Use   Vaping Use: Never used  Substance Use Topics   Alcohol use: Yes    Comment: occ   Drug use: No       OPHTHALMIC EXAM:  Base Eye Exam     Visual Acuity (Snellen - Linear)       Right Left   Dist [place] 20/40 20/25   Dist ph [place] 20/30 -1          Tonometry (Tonopen, 7:42 AM)       Right Left   Pressure 15 12         Pupils       Pupils Dark Light Shape React APD   Right PERRL 4 3 Round Brisk None   Left PERRL 3 2 Round  Brisk None         Visual Fields       Left Right    Full Full         Extraocular Movement       Right Left    Full, Ortho Full, Ortho         Neuro/Psych     Oriented x3: Yes   Mood/Affect: Normal         Dilation     Both eyes: 2.5% Phenylephrine @ 7:42 AM           Slit Lamp and Fundus Exam     Slit Lamp Exam       Right Left   Lids/Lashes Dermatochalasis - upper lid, UL telangiectasia Dermatochalasis - upper lid   Conjunctiva/Sclera White and quiet, residual STK ST quad, Subconjunctival hemorrhage ST quad nasal pingeucula   Cornea Mild arcus, well healed temporal cataract wounds, 2-3+fine PEE, trace endo pigment well healed temporal cataract wounds, 1-2+PEE, mild arcus, mild tear film debris, trace, fine endo pigment   Anterior Chamber Deep, trace cell/pigment Deep, trace cell/pigment   Iris Round and dilated Round and dilated   Lens MF PC IOL in perfect position with open PC MF PC IOL in perfect position with open PC   Anterior Vitreous Post vitrectomy, clear -- vitreous condensations and opacities gone post vitrectomy; clear; vitreous opacities gone         Fundus Exam       Right Left   Disc Pink and Sharp, Compact Pink and Sharp   C/D Ratio 0.3 0.5   Macula Flat, good foveal reflex, mild RPE mottling and clumping, mild ERM, trace cystic changes -- persistent, No heme Flat, good foveal reflex, mild RPE mottling and clumping, No heme or edema, trace ERM    Vessels mild attenuation mild attenuation   Periphery Attached, No heme, No RT/RD, 360 peripheral laser changes Attached, No heme, No RT/RD, good 360 laser changes           IMAGING AND PROCEDURES  Imaging and Procedures for 08/14/2022  OCT, Retina - OU - Both Eyes       Right Eye Quality was good. Central Foveal Thickness: 386. Progression has been stable. Findings include no SRF, abnormal foveal contour, epiretinal membrane, intraretinal fluid (Persistent cystic changes / IRF temporal fovea -- slightly increased, mild ERM).   Left Eye Quality was good. Central Foveal Thickness: 357. Progression has been stable. Findings include normal foveal contour, no IRF, no SRF (Trace cystic changes inferior macula).   Notes *Images captured and stored on drive  Diagnosis / Impression:  OD: Persistent cystic changes / IRF temporal fovea -- slightly increased, mild ERM OS: NFP; no IRF/SRF -- Trace cystic changes inferior macula  Clinical management:  See below  Abbreviations: NFP - Normal foveal profile. CME - cystoid macular edema. PED - pigment epithelial detachment. IRF - intraretinal fluid. SRF - subretinal fluid. EZ - ellipsoid zone. ERM - epiretinal membrane. ORA - outer retinal atrophy. ORT - outer retinal tubulation. SRHM - subretinal hyper-reflective material. IRHM - intraretinal hyper-reflective material            ASSESSMENT/PLAN:    ICD-10-CM   1. Cystoid macular edema of both eyes  H35.353 OCT, Retina - OU - Both Eyes    2. Vitreous syneresis of both eyes  H43.393     3. Vitreous floaters of both eyes  H43.393     4. Epiretinal membrane (ERM) of right eye  H35.371     5. Essential hypertension  I10     6. Hypertensive retinopathy of both eyes  H35.033     7. Pseudophakia, both eyes  Z96.1     8. Dry eyes  H04.123      1. CME OU  - initially diagnosed w/ CME OU by Dr. [name] on 08.16.21 and started on Durezol and BromSite TID OU  - s/p STK OD #1,  (09.30.22), #2 (04.20.23), #3 (08.24.23)  - PF and Prolensa stopped by Dr. [name] on 7.6.23 -- also switched to Combigan BID OU due to steroid-induced ocular hypertension  - today BCVA 20/30 from 20/20 OD, OS 20/25 from 20/20  - OCT shows OD: Persistent cystic changes / IRF temporal fovea -- slightly increased, mild ERM; OS: trace cystic changes inferior macula  - discussed possible need for long-term anti-inflammatory maintenance  - pt was switched to Diclofenac by pharmacy -- recommend switching back to Prolensa QID OD due to keratopathy induced by diclofenac - STK informed consent obtained and signed, 08.24.23 (OD)  - IOP 15,12 -- cont Combigan BID OU per Dr. [name]  - f/u 10 wks, DFE, OCT  2,3. Vitreous syneresis / floaters OU  - prominent symptomatic floaters OU  - s/p YAG vitreolysis OD 3.4.2020 ([place])  - s/p PPV/EL OS, 11.17.22 - tapered off all drops, BCVA 20/20             - s/p PPV/EL OD, 12.29.22             - doing well -- floaters improved  - BCVA 20/20 OU -- mild post op CME             - IOP good at 12,11  - drop schedule as above  - cont AT QID OU for corneal dryness, affecting vision             - drop instructions reviewed   - 10 weeks  4. Epiretinal membrane, right eye  - mild ERM -- first noted 3.30.23 visit -- stable today - BCVA 20/30 - asymptomatic, no metamorphopsia, just blurred vision - cystic changes could be related to ERM in addition to chronic CME - no indication for surgery at this time - monitor for now - f/u 3 months DFE, OCT  5,6. Hypertensive retinopathy OU - discussed importance of tight BP control - monitor  7. Pseudophakia OU  - s/p CE/IOL OU 2019  - multifocal IOLs in perfect position, doing well  - monitor  8. 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    Return in about 10 weeks (around 10/23/2022) for f/u CME OU, DFE, OCT.  There are no Patient Instructions on file for this visit.  This document serves as a record of services personally performed by Gardiner Sleeper, MD, PhD. It was created on their behalf by San Jetty. Owens Shark, OA an ophthalmic technician. The creation of this record is the provider's dictation and/or activities during the visit.    Electronically signed by: San Jetty. Owens Shark, [place] 11.01.2023 11:34 AM   Gardiner Sleeper, M.D., Ph.D. Diseases & Surgery of the Retina and Vitreous [facility]  I have reviewed the above documentation for accuracy and completeness, and I agree with the above. Gardiner Sleeper, M.D., Ph.D. 08/15/22 11:34 AM  Abbreviations: M myopia (nearsighted); A astigmatism; H hyperopia (farsighted); P presbyopia; Mrx spectacle prescription;  CTL contact lenses; OD right eye; OS left eye; OU both eyes  XT exotropia; ET esotropia; PEK punctate epithelial keratitis; PEE punctate epithelial erosions; DES dry eye syndrome; MGD meibomian gland dysfunction; ATs artificial tears; PFAT's preservative free artificial tears; [place] nuclear sclerotic cataract; PSC posterior subcapsular cataract; ERM epi-retinal membrane; PVD posterior vitreous detachment; RD retinal detachment; DM diabetes mellitus; DR diabetic retinopathy; NPDR non-proliferative diabetic retinopathy; PDR proliferative diabetic retinopathy; CSME clinically significant macular edema; DME diabetic macular edema; dbh dot blot hemorrhages; CWS cotton wool spot; POAG primary open angle glaucoma; C/D cup-to-disc ratio; HVF humphrey visual field; GVF goldmann visual field; OCT optical coherence tomography; IOP intraocular pressure; BRVO Branch retinal vein occlusion; CRVO central retinal vein occlusion; CRAO central retinal artery occlusion; BRAO branch retinal artery occlusion; RT retinal tear; SB scleral buckle; PPV pars  plana vitrectomy; VH Vitreous hemorrhage; PRP panretinal laser photocoagulation; IVK intravitreal kenalog; VMT vitreomacular traction; MH Macular hole;  NVD neovascularization of the disc; NVE neovascularization elsewhere; AREDS age related eye disease study; ARMD age related macular degeneration; POAG primary open angle glaucoma; EBMD epithelial/anterior basement membrane dystrophy; ACIOL anterior chamber intraocular lens; IOL intraocular lens; PCIOL posterior chamber intraocular lens; Phaco/IOL phacoemulsification with intraocular lens placement; [place] photorefractive keratectomy; LASIK laser assisted in situ keratomileusis; HTN hypertension; DM diabetes mellitus; COPD chronic obstructive pulmonary disease

## 2022-08-14 ENCOUNTER — Ambulatory Visit (INDEPENDENT_AMBULATORY_CARE_PROVIDER_SITE_OTHER): Payer: 59 | Admitting: Ophthalmology

## 2022-08-14 DIAGNOSIS — H43393 Other vitreous opacities, bilateral: Secondary | ICD-10-CM

## 2022-08-14 DIAGNOSIS — H04123 Dry eye syndrome of bilateral lacrimal glands: Secondary | ICD-10-CM

## 2022-08-14 DIAGNOSIS — H35353 Cystoid macular degeneration, bilateral: Secondary | ICD-10-CM

## 2022-08-14 DIAGNOSIS — H35351 Cystoid macular degeneration, right eye: Secondary | ICD-10-CM

## 2022-08-14 DIAGNOSIS — I10 Essential (primary) hypertension: Secondary | ICD-10-CM | POA: Diagnosis not present

## 2022-08-14 DIAGNOSIS — H35033 Hypertensive retinopathy, bilateral: Secondary | ICD-10-CM | POA: Diagnosis not present

## 2022-08-14 DIAGNOSIS — H35371 Puckering of macula, right eye: Secondary | ICD-10-CM | POA: Diagnosis not present

## 2022-08-14 DIAGNOSIS — Z961 Presence of intraocular lens: Secondary | ICD-10-CM

## 2022-08-15 ENCOUNTER — Encounter (INDEPENDENT_AMBULATORY_CARE_PROVIDER_SITE_OTHER): Payer: Self-pay | Admitting: Ophthalmology

## 2022-09-19 ENCOUNTER — Encounter (INDEPENDENT_AMBULATORY_CARE_PROVIDER_SITE_OTHER): Payer: Self-pay | Admitting: Ophthalmology

## 2022-10-15 NOTE — Progress Notes (Signed)
[facility] Note  10/23/2022    CHIEF COMPLAINT Patient presents for Retina Follow Up   HISTORY OF PRESENT ILLNESS: Andrew Mitchell is a 62 y.o. male who presents to the clinic today for:  HPI     Retina Follow Up   Patient presents with  Other.  In both eyes.  This started 10 weeks ago.  I, the attending physician,  performed the HPI with the patient and updated documentation appropriately.        Comments   Patient here for 10 weeks retina follow up for CME OU. Patient states vision no changes. Same. Sometimes when presses on eyeball has pain. Not sharp. On scale 1 - 10 its a 2. Uses drops Combigan BID OU and Diclofenac 5 times a day OD.      Last edited by [name], MD on 10/23/2022 11:46 AM.     Referring physician: [name], MD [place],  [place] [id number]  HISTORICAL INFORMATION:  Selected notes from the MEDICAL RECORD NUMBER Referred by Dr. [name] for eval of CME   CURRENT MEDICATIONS: Current Outpatient Medications (Ophthalmic Drugs)  Medication Sig   diclofenac (VOLTAREN) 0.1 % ophthalmic solution Place 1 drop into the right eye 4 (four) times daily.   brimonidine-timolol (COMBIGAN) 0.2-0.5 % ophthalmic solution Place 1 drop into both eyes in the morning and at bedtime. (Patient not taking: Reported on 10/23/2022)   Bromfenac Sodium (PROLENSA) 0.07 % SOLN Place 1 drop into the right eye 4 (four) times daily. (Patient not taking: Reported on 10/23/2022)   LOTEMAX SM 0.38 % GEL Place 1 drop into both eyes 2 (two) times daily. (Patient not taking: Reported on 05/02/2022)   loteprednol (LOTEMAX) 0.5 % ophthalmic suspension Place 1 drop into the right eye 3 (three) times daily.   prednisoLONE acetate (PRED FORTE) 1 % ophthalmic suspension Place 1 drop into the left eye 6 (six) times daily. (Patient not taking: Reported on 01/31/2022)   No current facility-administered medications for this visit. (Ophthalmic Drugs)    Current Outpatient Medications (Other)  Medication Sig   carbidopa-levodopa (SINEMET IR) 25-100 MG per tablet Take 0.5 tablets by mouth at bedtime. Take 1/2 to 1 tablet by mouth daily before bedtime.   clopidogrel (PLAVIX) 75 MG tablet TAKE 1 TABLET BY MOUTH EVERY DAY   ezetimibe (ZETIA) 10 MG tablet Take 1 tablet (10 mg total) by mouth daily.   famotidine (PEPCID) 40 MG tablet Take 40 mg by mouth at bedtime.   metoprolol tartrate (LOPRESSOR) 25 MG tablet TAKE 1 TABLET BY MOUTH 2 TIMES DAILY. PLEASE KEEP UPCOMING APPT IN [facility] WITH SMITH FOR MORE REFILLS   Multiple Vitamin (MULTIVITAMIN WITH MINERALS) TABS tablet Take 1 tablet by mouth in the morning. Centrum Multivitamin   nitroGLYCERIN (NITROSTAT) 0.4 MG SL tablet Place 1 tablet (0.4 mg total) under the tongue every 5 (five) minutes x 3 doses as needed for chest pain.   rosuvastatin (CRESTOR) 5 MG tablet Take 1 tablet (5 mg total) by mouth daily.   zolpidem (AMBIEN) 5 MG tablet Take 10 mg by mouth at bedtime as needed for sleep.   No current facility-administered medications for this visit. (Other)   REVIEW OF SYSTEMS: ROS   Positive for: Cardiovascular, Eyes, Psychiatric Negative for: Constitutional, Gastrointestinal, Neurological, Skin, Genitourinary, Musculoskeletal, HENT, Endocrine, Respiratory, Allergic/Imm, Heme/Lymph Last edited by Theodore Demark, COA on 10/23/2022  7:56 AM.      ALLERGIES Allergies  Allergen  Reactions   Shellfish Allergy Anaphylaxis    Just to shrimp   PAST MEDICAL HISTORY Past Medical History:  Diagnosis Date   Back pain    CAD (coronary artery disease)    a. 09/2018 Inf STEMI/PCI: LM nl, LAD min irregs, D1/2 min irregs, LCX 100ost/p thrombotic (2.75x35 Orsiro DES), RCA 40d, RPAV 90.   Carpal tunnel syndrome    DDD (degenerative disc disease)    Diastolic dysfunction    a. 09/2018 Echo: EF 50-55%, prob inflat and antlat HK, Gr1 DD. Mildlly dil Ao root (61m. Nl RV fxn.   Hyperlipemia     Hypertension    Hypertensive retinopathy    OU   Mildly Dilated aortic root ([facility]    a. 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 [place]Peterson [place]   COLONOSCOPY     CORONARY ANGIOGRAPHY N/A 10/08/2018   Procedure: CORONARY ANGIOGRAPHY;  Surgeon: CoSherren MochaMD;  [facility];  Service: Cardiovascular;  Laterality: N/A;   CORONARY STENT INTERVENTION N/A 10/08/2018   Procedure: CORONARY STENT INTERVENTION;  Surgeon: CoSherren MochaMD;  [facility];  Service: Cardiovascular;  Laterality: N/A;   CORONARY/GRAFT ACUTE MI REVASCULARIZATION N/A 10/08/2018   Procedure: Coronary/Graft Acute MI Revascularization;  Surgeon: CoSherren MochaMD;  [facility];  Service: Cardiovascular;  Laterality: N/A;   EYE SURGERY Bilateral 2019   Cat Sx - Dr. KaVevelyn Royals KNEE ARTHROSCOPY     rightx2   PARS PLANA VITRECTOMY Left 08/30/2021   Procedure: PARS PLANA VITRECTOMY WITH 25 GAUGE;  Surgeon: ZaBernarda CaffeyMD;  [facility] Service: Ophthalmology;  Laterality: Left;   PARS PLANA VITRECTOMY Right 10/11/2021   Procedure: TWM[place]ockITRECTOMY;  Surgeon: ZaBernarda CaffeyMD;  [place] Service: Ophthalmology;  Laterality: Right;   PHOTOCOAGULATION WITH LASER Left 08/30/2021   Procedure: PHOTOCOAGULATION WITH LASER;  Surgeon: ZaBernarda CaffeyMD;  [facility]: Ophthalmology;  Laterality: Left;   PHOTOCOAGULATION WITH LASER Right 10/11/2021   Procedure: PHOTOCOAGULATION WITH LASER;  Surgeon: ZaBernarda CaffeyMD;  [facility] Service: Ophthalmology;  Laterality: Right;   RIB  RESECTION  2003   thorasic outlet syndrome-rt   SHOULDER ARTHROSCOPY     rightx2   TONSILLECTOMY     FAMILY HISTORY History reviewed. No pertinent family history.  SOCIAL HISTORY Social History   Tobacco Use   Smoking status: Never   Smokeless tobacco: Never  Vaping Use   Vaping Use: Never used  Substance Use Topics   Alcohol use: Yes    Comment: occ   Drug use: No       OPHTHALMIC EXAM:  Base Eye Exam     Visual Acuity (Snellen - Linear)       Right Left   Dist [place] 20/40 -2 20/20 -1   Dist ph [place] NI          Tonometry (Tonopen, 7:52 AM)       Right Left   Pressure 18 13         Pupils       Dark Light Shape  React APD   Right 4 3 Round Brisk None   Left 3 2 Round Brisk None         Visual Fields (Counting fingers)       Left Right    Full Full         Extraocular Movement       Right Left    Full, Ortho Full, Ortho         Neuro/Psych     Oriented x3: Yes   Mood/Affect: Normal         Dilation     Both eyes: 1.0% Mydriacyl, 2.5% Phenylephrine @ 7:52 AM           Slit Lamp and Fundus Exam     Slit Lamp Exam       Right Left   Lids/Lashes Dermatochalasis - upper lid, UL telangiectasia Dermatochalasis - upper lid   Conjunctiva/Sclera White and quiet nasal pingeucula   Cornea Mild arcus, well healed temporal cataract wounds, 2+fine PEE, trace endo pigment well healed temporal cataract wounds, trace PEE, mild arcus, mild tear film debris, trace endo pigment   Anterior Chamber deep and clear Deep, trace cell/pigment   Iris Round and dilated Round and dilated   Lens MF PC IOL in perfect position with open PC MF PC IOL in perfect position with open PC   Anterior Vitreous Post vitrectomy, clear -- vitreous condensations and opacities gone post vitrectomy; clear; vitreous opacities gone         Fundus Exam       Right Left   Disc Pink and Sharp, Compact Pink and Sharp   C/D Ratio 0.3 0.5   Macula Flat, blunted foveal  reflex, mild RPE mottling and clumping, +ERM greatest temporal macula, trace cystic changes -- slightly increased, No heme Flat, good foveal reflex, mild RPE mottling and clumping, No heme or edema, trace ERM   Vessels mild attenuation attenuated, mild tortuosity   Periphery Attached, No heme, No RT/RD, 360 peripheral laser changes Attached, No heme, No RT/RD, good 360 laser changes           IMAGING AND PROCEDURES  Imaging and Procedures for 10/23/2022  OCT, Retina - OU - Both Eyes       Right Eye Quality was good. Central Foveal Thickness: 435. Progression has worsened. Findings include no SRF, abnormal foveal contour, epiretinal membrane, intraretinal fluid (Persistent cystic changes / IRF temporal fovea -- slightly increased, progression of ERM and loss of foveal contour).   Left Eye Quality was good. Central Foveal Thickness: 359. Progression has been stable. Findings include normal foveal contour, no IRF, no SRF (Trace cystic changes inferior macula -- slightly improved).   Notes *Images captured and stored on drive  Diagnosis / Impression:  OD: Persistent cystic changes / IRF temporal fovea -- slightly increased, progression of ERM and loss of foveal contour OS: NFP; no IRF/SRF -- Trace cystic changes inferior macula -- slightly improved  Clinical management:  See below  Abbreviations: NFP - Normal foveal profile. CME - cystoid macular edema. PED - pigment epithelial detachment. IRF - intraretinal fluid. SRF - subretinal fluid. EZ - ellipsoid zone. ERM - epiretinal membrane. ORA - outer retinal atrophy. ORT - outer retinal tubulation. SRHM - subretinal hyper-reflective material. IRHM - intraretinal hyper-reflective material      Injection into Tenon's Capsule - OD - Right Eye       Time Out 10/23/2022. 8:42 AM. Confirmed correct patient, procedure, site, and patient consented.  Anesthesia Topical anesthesia was used. Anesthetic medications included Lidocaine 2%,  Proparacaine 0.5%.   Procedure Preparation included 5% betadine to ocular surface, eyelid speculum. A (25g) needle was used.   Injection: 4 mg triamcinolone acetonide 40 MG/ML   Route: Intravitreal, Site: Right Eye   NDC: [phone], Lot: [id number], Expiration date: 01/12/2024   Post-op Post injection exam found visual acuity of at least counting fingers. The patient tolerated the procedure well. There were no complications. The patient received written and verbal post procedure care education. Post injection medications were not given.   Notes 0.9 cc of Kenalog-40 (36 mg) was injected into subtenon's capsule in the superotemporal quadrant. Betadine was applied to Injection area pre and post-injection then rinsed with sterile BSS. 1 drop of ofloxacin was instilled into the eye. There were no complications. Pt tolerated procedure well.            ASSESSMENT/PLAN:    ICD-10-CM   1. Cystoid macular edema of both eyes  H35.353 OCT, Retina - OU - Both Eyes    Injection into Tenon's Capsule - OD - Right Eye    triamcinolone acetonide (KENALOG-40) injection 4 mg    2. Vitreous syneresis of both eyes  H43.393     3. Vitreous floaters of both eyes  H43.393     4. Epiretinal membrane (ERM) of right eye  H35.371 OCT, Retina - OU - Both Eyes    5. Essential hypertension  I10     6. Hypertensive retinopathy of both eyes  H35.033     7. Pseudophakia, both eyes  Z96.1      1. CME OU  - initially diagnosed w/ CME OU by Dr. [name] on 08.16.21 and started on Durezol and BromSite TID OU  - s/p STK OD #1, (09.30.22), #2 (04.20.23), #3 (08.24.23)  - PF and Prolensa stopped by Dr. [name] on 7.6.23 -- also switched to Combigan BID OU due to steroid-induced ocular hypertension  - today BCVA 20/40 from 20/30 OD, OS 20/20 from 20/25  - OCT shows OD: Persistent cystic changes / IRF temporal fovea -- slightly increased, progression of ERM and loss of foveal contour; OS: NFP; no IRF/SRF --  Trace cystic changes inferior macula -- slightly improved  - discussed possible need for long-term anti-inflammatory maintenance  - pt was switched to Diclofenac by pharmacy and PF has been held due to history of steroid response  - recommend STK OD #4 today, 01.10.24 due to worse CME OD  - pt wishes to proceed with injection  - RBA of procedure discussed, questions answered - see procedure note - STK informed consent obtained and signed, 08.24.23 (OD)  - IOP 18,13 -- cont Combigan BID OU per Dr. [name]  - f/u 6-8 wks, DFE, OCT  2,3. Vitreous syneresis / floaters OU  - prominent symptomatic floaters OU  - s/p YAG vitreolysis OD 3.4.2020 ([place])  - s/p PPV/EL OS, 11.17.22 - tapered off all drops, BCVA 20/20             - s/p PPV/EL OD, 12.29.22             - doing well -- floaters improved  - BCVA OD: 20/40, OS: 20/20 -- mild post op CME             - IOP good at 18,13  - drop schedule as above  - cont AT QID OU for corneal dryness, affecting vision             -  drop instructions reviewed   - 10 weeks  4. Epiretinal membrane, right eye  - mild ERM -- first noted 3.30.23 visit -- worse today with interval increase in central thickness and IRF - BCVA 20/40 - asymptomatic, no metamorphopsia, just blurred vision - cystic changes could be related to ERM in addition to chronic CME - no indication for surgery at this time - monitor  5,6. Hypertensive retinopathy OU - discussed importance of tight BP control - monitor  7. Pseudophakia OU  - s/p CE/IOL OU 2019  - multifocal IOLs in perfect position, doing well  - monitor  8. Dry eyes OU - recommend artificial tears and lubricating ointment as needed  Ophthalmic Meds Ordered this visit:  Meds ordered this encounter  Medications   triamcinolone acetonide (KENALOG-40) injection 4 mg     Return for f/u 6-8 weeks, CME OU, DFE, OCT.  There are no Patient Instructions on file for this visit.  This document serves as a  record of services personally performed by Gardiner Sleeper, MD, PhD. It was created on their behalf by Orvan Falconer, an ophthalmic technician. The creation of this record is the provider's dictation and/or activities during the visit.    Electronically signed by: Orvan Falconer, OA, 10/23/22  11:59 AM  This document serves as a record of services personally performed by Gardiner Sleeper, MD, PhD. It was created on their behalf by San Jetty. Owens Shark, OA an ophthalmic technician. The creation of this record is the provider's dictation and/or activities during the visit.    Electronically signed by: San Jetty. Owens Shark, [place] 01.10.2024 11:59 AM  Gardiner Sleeper, M.D., Ph.D. Diseases & Surgery of the Retina and Vitreous [facility]  I have reviewed the above documentation for accuracy and completeness, and I agree with the above. Gardiner Sleeper, M.D., Ph.D. 10/23/22 12:04 PM   Abbreviations: M myopia (nearsighted); A astigmatism; H hyperopia (farsighted); P presbyopia; Mrx spectacle prescription;  CTL contact lenses; OD right eye; OS left eye; OU both eyes  XT exotropia; ET esotropia; PEK punctate epithelial keratitis; PEE punctate epithelial erosions; DES dry eye syndrome; MGD meibomian gland dysfunction; ATs artificial tears; PFAT's preservative free artificial tears; Anthony nuclear sclerotic cataract; PSC posterior subcapsular cataract; ERM epi-retinal membrane; PVD posterior vitreous detachment; RD retinal detachment; DM diabetes mellitus; DR diabetic retinopathy; NPDR non-proliferative diabetic retinopathy; PDR proliferative diabetic retinopathy; CSME clinically significant macular edema; DME diabetic macular edema; dbh dot blot hemorrhages; CWS cotton wool spot; POAG primary open angle glaucoma; C/D cup-to-disc ratio; HVF humphrey visual field; GVF goldmann visual field; OCT optical coherence tomography; IOP intraocular pressure; BRVO Branch retinal vein occlusion; CRVO central  retinal vein occlusion; CRAO central retinal artery occlusion; BRAO branch retinal artery occlusion; RT retinal tear; SB scleral buckle; PPV pars plana vitrectomy; VH Vitreous hemorrhage; PRP panretinal laser photocoagulation; IVK intravitreal kenalog; VMT vitreomacular traction; MH Macular hole;  NVD neovascularization of the disc; NVE neovascularization elsewhere; AREDS age related eye disease study; ARMD age related macular degeneration; POAG primary open angle glaucoma; EBMD epithelial/anterior basement membrane dystrophy; ACIOL anterior chamber intraocular lens; IOL intraocular lens; PCIOL posterior chamber intraocular lens; Phaco/IOL phacoemulsification with intraocular lens placement; [place] photorefractive keratectomy; LASIK laser assisted in situ keratomileusis; HTN hypertension; DM diabetes mellitus; COPD chronic obstructive pulmonary disease

## 2022-10-23 ENCOUNTER — Ambulatory Visit (INDEPENDENT_AMBULATORY_CARE_PROVIDER_SITE_OTHER): Payer: 59 | Admitting: Ophthalmology

## 2022-10-23 ENCOUNTER — Encounter (INDEPENDENT_AMBULATORY_CARE_PROVIDER_SITE_OTHER): Payer: Self-pay | Admitting: Ophthalmology

## 2022-10-23 DIAGNOSIS — Z961 Presence of intraocular lens: Secondary | ICD-10-CM

## 2022-10-23 DIAGNOSIS — H35353 Cystoid macular degeneration, bilateral: Secondary | ICD-10-CM

## 2022-10-23 DIAGNOSIS — H43393 Other vitreous opacities, bilateral: Secondary | ICD-10-CM

## 2022-10-23 DIAGNOSIS — I10 Essential (primary) hypertension: Secondary | ICD-10-CM | POA: Diagnosis not present

## 2022-10-23 DIAGNOSIS — H35371 Puckering of macula, right eye: Secondary | ICD-10-CM

## 2022-10-23 DIAGNOSIS — H35033 Hypertensive retinopathy, bilateral: Secondary | ICD-10-CM

## 2022-10-23 DIAGNOSIS — H04123 Dry eye syndrome of bilateral lacrimal glands: Secondary | ICD-10-CM

## 2022-10-23 MED ORDER — TRIAMCINOLONE ACETONIDE 40 MG/ML IJ SUSP FOR KALEIDOSCOPE
4.0000 mg | INTRAMUSCULAR | Status: AC | PRN
  Administered 2022-10-23: 4 mg via INTRAVITREAL

## 2022-12-05 NOTE — Progress Notes (Signed)
[facility] Note  12/11/2022    CHIEF COMPLAINT Patient presents for Retina Follow Up   HISTORY OF PRESENT ILLNESS: Andrew Mitchell is a 62 y.o. male who presents to the clinic today for:  HPI     Retina Follow Up   Patient presents with  Other (Cystoid macular edema).  In right eye.  Severity is moderate.  Duration of 7 weeks.  Since onset it is stable.  I, the attending physician,  performed the HPI with the patient and updated documentation appropriately.        Comments   Patient states vision fluctuates frequently OD. Using combigan bid OU and prolensa tid OD.      Last edited by [name], MD on 12/11/2022  8:41 AM.    Pt states he is using diclofenac TID OD, he states his vision in the right eye fluctuates, but left eye seems okay  Referring physician: [name], MD [place],   [id number]  HISTORICAL INFORMATION:  Selected notes from the [place] Referred by Dr. [name] for eval of CME   CURRENT MEDICATIONS: Current Outpatient Medications (Ophthalmic Drugs)  Medication Sig   brimonidine-timolol (COMBIGAN) 0.2-0.5 % ophthalmic solution Place 1 drop into both eyes in the morning and at bedtime.   Bromfenac Sodium (PROLENSA) 0.07 % SOLN Place 1 drop into the right eye 4 (four) times daily. (Patient taking differently: Place 1 drop into the right eye in the morning, at noon, and at bedtime.)   Bromfenac Sodium (PROLENSA) 0.07 % SOLN Place 1 drop into the right eye 3 (three) times daily.   diclofenac (VOLTAREN) 0.1 % ophthalmic solution Place 1 drop into the right eye 4 (four) times daily.   LOTEMAX SM 0.38 % GEL Place 1 drop into both eyes 2 (two) times daily. (Patient not taking: Reported on 05/02/2022)   loteprednol (LOTEMAX) 0.5 % ophthalmic suspension Place 1 drop into the right eye 3 (three) times daily.   prednisoLONE acetate (PRED FORTE) 1 % ophthalmic suspension Place 1 drop into the left eye  6 (six) times daily. (Patient not taking: Reported on 01/31/2022)   No current facility-administered medications for this visit. (Ophthalmic Drugs)   Current Outpatient Medications (Other)  Medication Sig   carbidopa-levodopa (SINEMET IR) 25-100 MG per tablet Take 0.5 tablets by mouth at bedtime. Take 1/2 to 1 tablet by mouth daily before bedtime.   clopidogrel (PLAVIX) 75 MG tablet TAKE 1 TABLET BY MOUTH EVERY DAY   ezetimibe (ZETIA) 10 MG tablet Take 1 tablet (10 mg total) by mouth daily.   famotidine (PEPCID) 40 MG tablet Take 40 mg by mouth at bedtime.   metoprolol tartrate (LOPRESSOR) 25 MG tablet TAKE 1 TABLET BY MOUTH 2 TIMES DAILY. PLEASE KEEP UPCOMING APPT IN [facility] WITH SMITH FOR MORE REFILLS   Multiple Vitamin (MULTIVITAMIN WITH MINERALS) TABS tablet Take 1 tablet by mouth in the morning. Centrum Multivitamin   nitroGLYCERIN (NITROSTAT) 0.4 MG SL tablet Place 1 tablet (0.4 mg total) under the tongue every 5 (five) minutes x 3 doses as needed for chest pain.   rosuvastatin (CRESTOR) 5 MG tablet Take 1 tablet (5 mg total) by mouth daily.   zolpidem (AMBIEN) 5 MG tablet Take 10 mg by mouth at bedtime as needed for sleep.   No current facility-administered medications for this visit. (Other)   REVIEW OF SYSTEMS: ROS   Positive for: Cardiovascular, Eyes, Psychiatric Negative for: Constitutional, Gastrointestinal, Neurological, Skin,  Genitourinary, Musculoskeletal, HENT, Endocrine, Respiratory, Allergic/Imm, Heme/Lymph Last edited by Roselee Nova D, COT on 12/11/2022  7:32 AM.       ALLERGIES Allergies  Allergen Reactions   Shellfish Allergy Anaphylaxis    Just to shrimp   PAST MEDICAL HISTORY Past Medical History:  Diagnosis Date   Back pain    CAD (coronary artery disease)    a. 09/2018 Inf STEMI/PCI: LM nl, LAD min irregs, D1/2 min irregs, LCX 100ost/p thrombotic (2.75x35 Orsiro DES), RCA 40d, RPAV 90.   Carpal tunnel syndrome    DDD (degenerative disc disease)     Diastolic dysfunction    a. 09/2018 Echo: EF 50-55%, prob inflat and antlat HK, Gr1 DD. Mildlly dil Ao root (68m. Nl RV fxn.   Hyperlipemia    Hypertension    Hypertensive retinopathy    OU   Mildly Dilated aortic root ([facility]    a. 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 [facility]eterson [place]   COLONOSCOPY     CORONARY ANGIOGRAPHY N/A 10/08/2018   Procedure: CORONARY ANGIOGRAPHY;  Surgeon: CoSherren MochaMD;  [facility];  Service: Cardiovascular;  Laterality: N/A;   CORONARY STENT INTERVENTION N/A 10/08/2018   Procedure: CORONARY STENT INTERVENTION;  Surgeon: CoSherren MochaMD;  [facility];  Service: Cardiovascular;  Laterality: N/A;   CORONARY/GRAFT ACUTE MI REVASCULARIZATION N/A 10/08/2018   Procedure: Coronary/Graft Acute MI Revascularization;  Surgeon: CoSherren MochaMD;  [place] [facility];  Service: Cardiovascular;  Laterality: N/A;   EYE SURGERY Bilateral 2019   Cat Sx - Dr. KaVevelyn Royals KNEE ARTHROSCOPY     rightx2   PARS PLANA VITRECTOMY Left 08/30/2021   Procedure: PARS PLANA VITRECTOMY WITH 25 GAUGE;  Surgeon: ZaBernarda CaffeyMD;  [place] [facility]: Ophthalmology;  Laterality: Left;   PARS PLANA VITRECTOMY Right 10/11/2021   Procedure: TWSimpsonITRECTOMY;  Surgeon: ZaBernarda CaffeyMD;  [place] [facility] Service: Ophthalmology;  Laterality: Right;   PHOTOCOAGULATION WITH LASER Left 08/30/2021   Procedure: PHOTOCOAGULATION WITH LASER;  Surgeon: ZaBernarda CaffeyMD;  [place] [facility] Service: Ophthalmology;  Laterality: Left;   PHOTOCOAGULATION WITH LASER Right 10/11/2021    Procedure: PHOTOCOAGULATION WITH LASER;  Surgeon: ZaBernarda CaffeyMD;  [place] [facility] Service: Ophthalmology;  Laterality: Right;   RIB RESECTION  2003   thorasic outlet syndrome-rt   SHOULDER ARTHROSCOPY     rightx2   TONSILLECTOMY     FAMILY HISTORY History reviewed. No pertinent family history.  SOCIAL HISTORY Social History   Tobacco Use   Smoking status: Never   Smokeless tobacco: Never  Vaping Use   Vaping Use: Never used  Substance Use Topics   Alcohol use: Yes    Comment: occ   Drug use: No       OPHTHALMIC EXAM:  Base Eye Exam     Visual Acuity (Snellen - Linear)       Right Left   Dist [place] 20/50 -2 20/20   Dist ph [place] 20/30 +1          Tonometry (Applanation, 7:44 AM)  Right Left   Pressure 21 14         Tonometry #2 (Tonopen, 7:46 AM)       Right Left   Pressure 18          Tonometry Comments   Tonopen measurements fluctuate, so I applanated OU        Pupils       Dark Light Shape React APD   Right 4 3 Round Brisk None   Left 3 2 Round Brisk None         Visual Fields (Counting fingers)       Left Right    Full Full         Extraocular Movement       Right Left    Full, Ortho Full, Ortho         Neuro/Psych     Oriented x3: Yes   Mood/Affect: Normal         Dilation     Both eyes: 1.0% Mydriacyl, 2.5% Phenylephrine @ 7:46 AM           Slit Lamp and Fundus Exam     Slit Lamp Exam       Right Left   Lids/Lashes Dermatochalasis - upper lid, mild Ptosis Meibomian gland dysfunction   Conjunctiva/Sclera White and quiet, STK ST quad nasal pingeucula   Cornea Mild arcus, well healed temporal cataract wounds, 2-3+ central PEE well healed temporal cataract wounds, mild arcus, tear film debris   Anterior Chamber deep and clear, no cell or flare Deep, trace cell/pigment   Iris Round and dilated Round and dilated   Lens MF PC IOL in perfect position with open PC MF PC IOL in perfect position with  open PC   Anterior Vitreous Post vitrectomy, clear -- vitreous condensations and opacities gone post vitrectomy; clear; vitreous opacities gone         Fundus Exam       Right Left   Disc Pink and Sharp, Compact, +PPA Pink and Sharp   C/D Ratio 0.3 0.5   Macula Flat, blunted foveal reflex, mild RPE mottling and clumping, +ERM greatest temporal macula, trace cystic changes -- improve3d, No heme Flat, good foveal reflex, mild RPE mottling and clumping, No heme or edema, trace ERM   Vessels mild attenuation, mild tortuosity mild attenuation, mild tortuosity   Periphery Attached, No heme, No RT/RD, 360 peripheral laser changes Attached, No heme, No RT/RD, good 360 laser changes           IMAGING AND PROCEDURES  Imaging and Procedures for 12/11/2022  OCT, Retina - OU - Both Eyes       Right Eye Quality was good. Central Foveal Thickness: 417. Progression has improved. Findings include no SRF, abnormal foveal contour, epiretinal membrane, intraretinal fluid (Interval improvement in central cystic changes, persistent ERM and loss of foveal contour).   Left Eye Quality was good. Central Foveal Thickness: 360. Progression has improved. Findings include normal foveal contour, no IRF, no SRF (Trace cystic changes inferior macula -- improved).   Notes *Images captured and stored on drive  Diagnosis / Impression:  OD: Interval improvement in central cystic changes, persistent ERM and loss of foveal contour OS: NFP; no IRF/SRF -- Trace cystic changes inferior macula -- improved  Clinical management:  See below  Abbreviations: NFP - Normal foveal profile. CME - cystoid macular edema. PED - pigment epithelial detachment. IRF - intraretinal fluid. SRF - subretinal fluid. EZ - ellipsoid zone. ERM -  epiretinal membrane. ORA - outer retinal atrophy. ORT - outer retinal tubulation. SRHM - subretinal hyper-reflective material. IRHM - intraretinal hyper-reflective material             ASSESSMENT/PLAN:    ICD-10-CM   1. Cystoid macular edema of both eyes  H35.353 OCT, Retina - OU - Both Eyes    2. Vitreous syneresis of both eyes  H43.393     3. Vitreous floaters of both eyes  H43.393     4. Epiretinal membrane (ERM) of right eye  H35.371     5. Essential hypertension  I10     6. Hypertensive retinopathy of both eyes  H35.033     7. Pseudophakia, both eyes  Z96.1     8. Dry eyes  H04.123      1. CME OU  - initially diagnosed w/ CME OU by Dr. [name] on 08.16.21 and started on Durezol and BromSite TID OU  - s/p STK OD #1, (09.30.22), #2 (04.20.23), #3 (08.24.23)  - PF and Prolensa stopped by Dr. [name] on 7.6.23 -- also switched to Combigan BID OU due to steroid-induced ocular hypertension  - today BCVA 20/30 from 20/40 OD, OS stable at 20/20  - OCT shows OD: mild interval improvement in central cystic changes, persistent ERM and loss of foveal contour; OS: trace cystic changes inferior macula -- slightly improved  - discussed possible need for long-term anti-inflammatory maintenance  - pt was switched to Diclofenac by pharmacy and PF has been held due to history of steroid response - STK informed consent obtained and signed, 08.24.23 (OD)  - IOP 21,14 -- cont Combigan BID OU per Dr. [name]  - cont Prolensa / Diclofenac QID OD -- Prolensa sample given and new Rx re-sent to pharmacy  - f/u 2-3 months, DFE, OCT  2,3. Vitreous syneresis / floaters OU  - prominent symptomatic floaters OU  - s/p YAG vitreolysis OD 3.4.2020 ([place])  - s/p PPV/EL OS, 11.17.22 - tapered off all drops, BCVA 20/20             - s/p PPV/EL OD, 12.29.22             - doing well -- floaters improved  - BCVA OD: 20/30, OS: 20/20 -- mild post op CME OD             - IOP good at 21,14  - drop schedule as above  - cont AT QID OU for corneal dryness, affecting vision             - drop instructions reviewed   - monitor  4. Epiretinal membrane, right eye  - mild ERM  -- first noted 3.30.23 visit -- stable today with interval improvement in central thickness and IRF - BCVA 20/30 from 20/40 - asymptomatic, no metamorphopsia, just blurred vision - cystic changes could be related to ERM in addition to chronic CME - no indication for surgery at this time - monitor  5,6. Hypertensive retinopathy OU - discussed importance of tight BP control - monitor  7. Pseudophakia OU  - s/p CE/IOL OU 2019  - multifocal IOLs in perfect position, doing well  - monitor  8. Dry eyes OU - recommend artificial tears and lubricating ointment as needed  Ophthalmic Meds Ordered this visit:  Meds ordered this encounter  Medications   Bromfenac Sodium (PROLENSA) 0.07 % SOLN    Sig: Place 1 drop into the right eye 3 (three) times daily.    Dispense:  3  mL    Refill:  6     Return for f/u 2-3 months, CME OU, DFE, OCT.  There are no Patient Instructions on file for this visit.  This document serves as a record of services personally performed by Gardiner Sleeper, MD, PhD. It was created on their behalf by Orvan Falconer, an ophthalmic technician. The creation of this record is the provider's dictation and/or activities during the visit.    Electronically signed by: Orvan Falconer, OA, 12/11/22  1:17 PM  This document serves as a record of services personally performed by Gardiner Sleeper, MD, PhD. It was created on their behalf by San Jetty. Owens Shark, OA an ophthalmic technician. The creation of this record is the provider's dictation and/or activities during the visit.    Electronically signed by: San Jetty. Owens Shark, [place] 02.28.2024 1:17 PM  Gardiner Sleeper, M.D., Ph.D. Diseases & Surgery of the Retina and Vitreous [facility]  I have reviewed the above documentation for accuracy and completeness, and I agree with the above. Gardiner Sleeper, M.D., Ph.D. 12/11/22 1:25 PM   Abbreviations: M myopia (nearsighted); A astigmatism; H hyperopia  (farsighted); P presbyopia; Mrx spectacle prescription;  CTL contact lenses; OD right eye; OS left eye; OU both eyes  XT exotropia; ET esotropia; PEK punctate epithelial keratitis; PEE punctate epithelial erosions; DES dry eye syndrome; MGD meibomian gland dysfunction; ATs artificial tears; PFAT's preservative free artificial tears; [place] nuclear sclerotic cataract; PSC posterior subcapsular cataract; ERM epi-retinal membrane; PVD posterior vitreous detachment; RD retinal detachment; DM diabetes mellitus; DR diabetic retinopathy; NPDR non-proliferative diabetic retinopathy; PDR proliferative diabetic retinopathy; CSME clinically significant macular edema; DME diabetic macular edema; dbh dot blot hemorrhages; CWS cotton wool spot; POAG primary open angle glaucoma; C/D cup-to-disc ratio; HVF humphrey visual field; GVF goldmann visual field; OCT optical coherence tomography; IOP intraocular pressure; BRVO Branch retinal vein occlusion; CRVO central retinal vein occlusion; CRAO central retinal artery occlusion; BRAO branch retinal artery occlusion; RT retinal tear; SB scleral buckle; PPV pars plana vitrectomy; VH Vitreous hemorrhage; PRP panretinal laser photocoagulation; IVK intravitreal kenalog; VMT vitreomacular traction; MH Macular hole;  NVD neovascularization of the disc; NVE neovascularization elsewhere; AREDS age related eye disease study; ARMD age related macular degeneration; POAG primary open angle glaucoma; EBMD epithelial/anterior basement membrane dystrophy; ACIOL anterior chamber intraocular lens; IOL intraocular lens; PCIOL posterior chamber intraocular lens; Phaco/IOL phacoemulsification with intraocular lens placement; [place] photorefractive keratectomy; LASIK laser assisted in situ keratomileusis; HTN hypertension; DM diabetes mellitus; COPD chronic obstructive pulmonary disease

## 2022-12-11 ENCOUNTER — Ambulatory Visit (INDEPENDENT_AMBULATORY_CARE_PROVIDER_SITE_OTHER): Payer: 59 | Admitting: Ophthalmology

## 2022-12-11 ENCOUNTER — Encounter (INDEPENDENT_AMBULATORY_CARE_PROVIDER_SITE_OTHER): Payer: Self-pay | Admitting: Ophthalmology

## 2022-12-11 DIAGNOSIS — H35351 Cystoid macular degeneration, right eye: Secondary | ICD-10-CM

## 2022-12-11 DIAGNOSIS — H35371 Puckering of macula, right eye: Secondary | ICD-10-CM | POA: Diagnosis not present

## 2022-12-11 DIAGNOSIS — H04123 Dry eye syndrome of bilateral lacrimal glands: Secondary | ICD-10-CM

## 2022-12-11 DIAGNOSIS — I10 Essential (primary) hypertension: Secondary | ICD-10-CM

## 2022-12-11 DIAGNOSIS — H35033 Hypertensive retinopathy, bilateral: Secondary | ICD-10-CM

## 2022-12-11 DIAGNOSIS — H35353 Cystoid macular degeneration, bilateral: Secondary | ICD-10-CM | POA: Diagnosis not present

## 2022-12-11 DIAGNOSIS — H43393 Other vitreous opacities, bilateral: Secondary | ICD-10-CM | POA: Diagnosis not present

## 2022-12-11 DIAGNOSIS — Z961 Presence of intraocular lens: Secondary | ICD-10-CM

## 2022-12-11 MED ORDER — BROMFENAC SODIUM 0.07 % OP SOLN: 1.0000 [drp] | Freq: Three times a day (TID) | OPHTHALMIC | 6 refills | Status: DC

## 2022-12-17 NOTE — Progress Notes (Unsigned)
Cardiology Office Note:    Date:  12/19/2022   ID:  Andrew Mitchell, DOB [birth year], MRN [id number]  PCP:  [name], MD   [place] Providers Cardiologist:  Sinclair Grooms, MD (Inactive)   Referring MD: [name], MD    History of Present Illness:    Andrew Mitchell is a 62 y.o. male with a hx of CAD with history of inferior STEMI in 09/2018 s/p PCI to LCx, HTN, and HLD who was previously followed by Dr. Tamala Julian who now returns to clinic for follow-up.  Last saw Dr. Tamala Julian in 12/2021 where he was doing well from a CV standpoint.  Today, the patient overall feels okay today. No chest pain with exertion, SOB, lightheadedness, dizziness, LE edema, orthopnea, PND, or palpitations. Tolerating medications as prescribed. Blood pressure is well controlled at home. Remains active and is able to mow the lawn without issue.  Past Medical History:  Diagnosis Date   Back pain    CAD (coronary artery disease)    a. 09/2018 Inf STEMI/PCI: LM nl, LAD min irregs, D1/2 min irregs, LCX 100ost/p thrombotic (2.75x35 Orsiro DES), RCA 40d, RPAV 90.   Carpal tunnel syndrome    DDD (degenerative disc disease)    Diastolic dysfunction    a. 09/2018 Echo: EF 50-55%, prob inflat and antlat HK, Gr1 DD. Mildlly dil Ao root (42m. Nl RV fxn.   Hyperlipemia    Hypertension    Hypertensive retinopathy    OU   Mildly Dilated aortic root ([place]    a. 09/2018 Echo: 470m   Tubular adenocarcinoma ([id number]   colon    Past Surgical History:  Procedure Laterality Date   CARPAL TUNNEL RELEASE Left 01/06/2013   Procedure: CARPAL TUNNEL RELEASE;  Surgeon: GaWynonia SoursMD;  [place] [facility] [place] [facility] [place];  Laterality: Left;  ANESTHESIA: IV REGIONAL FAB   CATARACT EXTRACTION Bilateral 2019   Dr. KaPeterson Aotonecipher   COLONOSCOPY     CORONARY ANGIOGRAPHY N/A 10/08/2018   Procedure: CORONARY ANGIOGRAPHY;  Surgeon: CoSherren MochaMD;  [facility];   Service: Cardiovascular;  Laterality: N/A;   CORONARY STENT INTERVENTION N/A 10/08/2018   Procedure: CORONARY STENT INTERVENTION;  Surgeon: CoSherren MochaMD;  [place] [facility];  Service: Cardiovascular;  Laterality: N/A;   CORONARY/GRAFT ACUTE MI REVASCULARIZATION N/A 10/08/2018   Procedure: Coronary/Graft Acute MI Revascularization;  Surgeon: CoSherren MochaMD;  [place] [facility];  Service: Cardiovascular;  Laterality: N/A;   EYE SURGERY Bilateral 2019   Cat Sx - Dr. KaVevelyn Royals KNEE ARTHROSCOPY     rightx2   PARS PLANA VITRECTOMY Left 08/30/2021   Procedure: PARS PLANA VITRECTOMY WITH 25 GAUGE;  Surgeon: ZaBernarda CaffeyMD;  [place] [facility]: Ophthalmology;  Laterality: Left;   PARS PLANA VITRECTOMY Right 10/11/2021   Procedure: [place];  Surgeon: ZaBernarda CaffeyMD;  [place] [facility] Service: Ophthalmology;  Laterality: Right;   PHOTOCOAGULATION WITH LASER Left 08/30/2021   Procedure: PHOTOCOAGULATION WITH LASER;  Surgeon: ZaBernarda CaffeyMD;  [place] [facility] Service: Ophthalmology;  Laterality: Left;   PHOTOCOAGULATION WITH LASER Right 10/11/2021   Procedure: PHOTOCOAGULATION WITH LASER;  Surgeon: ZaBernarda CaffeyMD;  [place] [facility] Service: Ophthalmology;  Laterality: Right;   RIB RESECTION  2003   thorasic outlet syndrome-rt   SHOULDER ARTHROSCOPY     rightx2   TONSILLECTOMY  Current Medications: Current Meds  Medication Sig   brimonidine-timolol (COMBIGAN) 0.2-0.5 % ophthalmic solution Place 1 drop into both eyes in the morning and at bedtime.   Bromfenac Sodium (PROLENSA) 0.07 % SOLN Place 1 drop into the right eye 4 (four) times daily. (Patient taking differently: Place 1 drop into the right eye in the morning, at noon, and at bedtime.)   carbidopa-levodopa (SINEMET IR) 25-100 MG per tablet Take 0.5 tablets by mouth at bedtime. Take 1/2 to 1 tablet by mouth daily before bedtime.   clopidogrel  (PLAVIX) 75 MG tablet TAKE 1 TABLET BY MOUTH EVERY DAY   ezetimibe (ZETIA) 10 MG tablet Take 1 tablet (10 mg total) by mouth daily.   famotidine (PEPCID) 40 MG tablet Take 40 mg by mouth at bedtime.   metoprolol tartrate (LOPRESSOR) 25 MG tablet TAKE 1 TABLET BY MOUTH 2 TIMES DAILY. PLEASE KEEP UPCOMING APPT IN [facility] WITH SMITH FOR MORE REFILLS   Multiple Vitamin (MULTIVITAMIN WITH MINERALS) TABS tablet Take 1 tablet by mouth in the morning. Centrum Multivitamin   nitroGLYCERIN (NITROSTAT) 0.4 MG SL tablet Place 1 tablet (0.4 mg total) under the tongue every 5 (five) minutes x 3 doses as needed for chest pain.   rosuvastatin (CRESTOR) 5 MG tablet Take 1 tablet (5 mg total) by mouth daily.   zolpidem (AMBIEN) 5 MG tablet Take 10 mg by mouth at bedtime as needed for sleep.     Allergies:   Shellfish allergy   Social History   Socioeconomic History   Marital status: Married    Spouse name: Not on file   Number of children: Not on file   Years of education: Not on file   Highest education level: Not on file  Occupational History   Not on file  Tobacco Use   Smoking status: Never   Smokeless tobacco: Never  Vaping Use   Vaping Use: Never used  Substance and Sexual Activity   Alcohol use: Yes    Comment: occ   Drug use: No   Sexual activity: Not on file  Other Topics Concern   Not on file  Social History Narrative   Not on file   Social Determinants of Health   Financial Resource Strain: Not on file  Food Insecurity: Not on file  Transportation Needs: Not on file  Physical Activity: Not on file  Stress: Not on file  Social Connections: Not on file     Family History: The patient's family history is not on file.  ROS:   Please see the history of present illness.     All other systems reviewed and are negative.  EKGs/Labs/Other Studies Reviewed:    The following studies were reviewed today: TTE 2018-01-19: Study Conclusions   - Left ventricle: The cavity size was normal.  Systolic function was    normal. The estimated ejection fraction was in the range of 50%    to 55%. Probable hypokinesis of the inferolateral and    anterolateral walls. Suboptimal image quality despite the use of    Definity. Doppler parameters are consistent with abnormal left    ventricular relaxation (grade 1 diastolic dysfunction).  - Aortic valve: Transvalvular velocity was within the normal range.    There was no stenosis. There was no regurgitation.  - Aorta: Aortic root dimension: 42 mm (ED). Mid-ascending aortic    diameter: 39 mm (ED).  - Aortic root: The aortic root was mildly dilated.  - Ascending aorta: The ascending aorta was mildly dilated.  -  Mitral valve: Transvalvular velocity was within the normal range.    There was no evidence for stenosis. There was no regurgitation.  - Left atrium: The atrium was normal in size.  - Right ventricle: The cavity size was normal. Wall thickness was    normal. Systolic function was normal.  - Right atrium: The atrium was normal in size.  - Tricuspid valve: There was no regurgitation.  - Inferior vena cava: The vessel was normal in size. The    respirophasic diameter changes were in the normal range (>= 50%),    consistent with normal central venous pressure.  - Pericardium, extracardiac: There was no pericardial effusion.   Cath 2019:  Ost Cx to Prox Cx lesion is 100% stenosed. Post Atrio lesion is 90% stenosed. Dist RCA lesion is 40% stenosed. A drug-eluting stent was successfully placed using a STENT ORSIRO 2.75X35. Post intervention, there is a 0% residual stenosis.   1.  Patent left main, RCA, and LAD with mild diffuse luminal irregularities 2.  Severe stenosis of the right posterior lateral branch at the origin of the second PL 3.  Acute total occlusion of the proximal circumflex, treated successfully with primary PCI using a 2.75 x 35 mm Orsiro DES   Recommend:  ASA/Brilinta x 12 months without interruption Echo for  assessment of LV function Post-MI medical therapy  EKG:  EKG is  ordered today.  The ekg ordered today demonstrates NSR with inferior q waves, HR 62  Recent Labs: No results found for requested labs within last 365 days.  Recent Lipid Panel    Component Value Date/Time   CHOL 135 12/14/2021 0759   TRIG 99 12/14/2021 0759   HDL 41 12/14/2021 0759   CHOLHDL 3.3 12/14/2021 0759   CHOLHDL 3.3 10/09/2018 0028   VLDL 7 10/09/2018 0028   LDLCALC 75 12/14/2021 0759     Risk Assessment/Calculations:                Physical Exam:    VS:  BP 120/82   Pulse 62   Ht '5\' 10"'$  (1.778 m)   Wt 183 lb (83 kg)   SpO2 96%   BMI 26.26 kg/m     Wt Readings from Last 3 Encounters:  12/19/22 183 lb (83 kg)  12/14/21 188 lb 12.8 oz (85.6 kg)  10/11/21 184 lb (83.5 kg)     GEN:  Well nourished, well developed in no acute distress HEENT: Normal NECK: No JVD; No carotid bruits CARDIAC: RRR, no murmurs, rubs, gallops RESPIRATORY:  Clear to auscultation without rales, wheezing or rhonchi  ABDOMEN: Soft, non-tender, non-distended MUSCULOSKELETAL:  No edema; No deformity  SKIN: Warm and dry NEUROLOGIC:  Alert and oriented x 3 PSYCHIATRIC:  Normal affect   ASSESSMENT:    1. Coronary artery disease involving native coronary artery of native heart with unstable angina pectoris ([place])   2. Hyperlipidemia, unspecified hyperlipidemia type   3. Essential hypertension   4. Aortic dilatation (HCC)    PLAN:    In order of problems listed above:  #Coronary Artery Disease: Patient with history of inferior MI  in 2019 found to have 100% ostial to prox Lcx stenosis s/p PCI. Currently doing well without anginal symptoms. -Continue plavix '75mg'$  daily -Continue crestor '5mg'$  daily -Continue zetia '10mg'$  daily -Continue metop '25mg'$  BID  #HTN: Very well controlled and at goal <130/90. -Continue metop '25mg'$  BID (does not want to switch today; can switch to succinate in future)  #HLD: -Continue crestor  '5mg'$   daily -Continue zetia '10mg'$  daily -Check lipid panel and A1C today -Goal LDL<70  #Aortic Root Dilation: Noted to be 48m on TTE in 2019. -Repeat TTE for screening -Continue metop, crestor and zetia as above            Medication Adjustments/Labs and Tests Ordered: Current medicines are reviewed at length with the patient today.  Concerns regarding medicines are outlined above.  Orders Placed This Encounter  Procedures   Hepatitis C Antibody   Lipid panel   Hemoglobin A1c   EKG 12-Lead   ECHOCARDIOGRAM COMPLETE   No orders of the defined types were placed in this encounter.   Patient Instructions  Medication Instructions:  Your physician recommends that you continue on your current medications as directed. Please refer to the Current Medication list given to you today.  *If you need a refill on your cardiac medications before your next appointment, please call your pharmacy*   Lab Work: Lipids, AIC, Hep C  screen If you have labs (blood work) drawn today and your tests are completely normal, you will receive your results only by: [place](if you have MyChart) OR A paper copy in the mail If you have any lab test that is abnormal or we need to change your treatment, we will call you to review the results.   Testing/Procedures: Your physician has requested that you have an echocardiogram. Echocardiography is a painless test that uses sound waves to create images of your heart. It provides your doctor with information about the size and shape of your heart and how well your heart's chambers and valves are working. This procedure takes approximately one hour. There are no restrictions for this procedure. Please do NOT wear cologne, perfume, aftershave, or lotions (deodorant is allowed). Please arrive 15 minutes prior to your appointment time.     Follow-Up: [facility] and [facility] are our [facility].  As part of our continuing mission  to provide you with exceptional heart care, we have created designated Provider Care Teams.  These Care Teams include your primary Cardiologist (physician) and Advanced Practice Providers (APPs -  Physician Assistants and Nurse Practitioners) who all work together to provide you with the care you need, when you need it.   Your next appointment:   1 year(s)  Provider:   Dr PJohney Frame Other Instructions    Signed, HFreada Bergeron MD  12/19/2022 9:50 AM    [place]

## 2022-12-19 ENCOUNTER — Encounter: Payer: Self-pay | Admitting: Cardiology

## 2022-12-19 ENCOUNTER — Ambulatory Visit: Payer: 59 | Attending: Cardiology | Admitting: Cardiology

## 2022-12-19 VITALS — BP 120/82 | HR 62 | Ht 70.0 in | Wt 183.0 lb

## 2022-12-19 DIAGNOSIS — I10 Essential (primary) hypertension: Secondary | ICD-10-CM

## 2022-12-19 DIAGNOSIS — E785 Hyperlipidemia, unspecified: Secondary | ICD-10-CM | POA: Diagnosis not present

## 2022-12-19 DIAGNOSIS — I25110 Atherosclerotic heart disease of native coronary artery with unstable angina pectoris: Secondary | ICD-10-CM

## 2022-12-19 DIAGNOSIS — I77819 Aortic ectasia, unspecified site: Secondary | ICD-10-CM

## 2022-12-19 NOTE — Patient Instructions (Signed)
Medication Instructions:  Your physician recommends that you continue on your current medications as directed. Please refer to the Current Medication list given to you today.  *If you need a refill on your cardiac medications before your next appointment, please call your pharmacy*   Lab Work: Lipids, AIC, Hep C  screen If you have labs (blood work) drawn today and your tests are completely normal, you will receive your results only by: [place] (if you have MyChart) OR A paper copy in the mail If you have any lab test that is abnormal or we need to change your treatment, we will call you to review the results.   Testing/Procedures: Your physician has requested that you have an echocardiogram. Echocardiography is a painless test that uses sound waves to create images of your heart. It provides your doctor with information about the size and shape of your heart and how well your heart's chambers and valves are working. This procedure takes approximately one hour. There are no restrictions for this procedure. Please do NOT wear cologne, perfume, aftershave, or lotions (deodorant is allowed). Please arrive 15 minutes prior to your appointment time.     Follow-Up: At [facility], you and your health needs are our priority.  As part of our continuing mission to provide you with exceptional heart care, we have created designated Provider Care Teams.  These Care Teams include your primary Cardiologist (physician) and Advanced Practice Providers (APPs -  Physician Assistants and Nurse Practitioners) who all work together to provide you with the care you need, when you need it.   Your next appointment:   1 year(s)  Provider:   Dr Johney Frame  Other Instructions

## 2022-12-20 LAB — HEPATITIS C ANTIBODY: Hep C Virus Ab: NONREACTIVE

## 2022-12-20 LAB — LIPID PANEL
Chol/HDL Ratio: 3.4 ratio (ref 0.0–5.0)
Cholesterol, Total: 126 mg/dL (ref 100–199)
HDL: 37 mg/dL — ABNORMAL LOW
LDL Chol Calc (NIH): 66 mg/dL (ref 0–99)
Triglycerides: 127 mg/dL (ref 0–149)
VLDL Cholesterol Cal: 23 mg/dL (ref 5–40)

## 2022-12-20 LAB — HEMOGLOBIN A1C
Est. average glucose Bld gHb Est-mCnc: 117 mg/dL
Hgb A1c MFr Bld: 5.7 % — ABNORMAL HIGH (ref 4.8–5.6)

## 2023-01-16 ENCOUNTER — Ambulatory Visit (HOSPITAL_COMMUNITY): Payer: 59 | Attending: Cardiology

## 2023-01-16 DIAGNOSIS — I25110 Atherosclerotic heart disease of native coronary artery with unstable angina pectoris: Secondary | ICD-10-CM

## 2023-01-16 DIAGNOSIS — I77819 Aortic ectasia, unspecified site: Secondary | ICD-10-CM

## 2023-01-16 DIAGNOSIS — I517 Cardiomegaly: Secondary | ICD-10-CM

## 2023-01-16 DIAGNOSIS — I77810 Thoracic aortic ectasia: Secondary | ICD-10-CM

## 2023-01-16 DIAGNOSIS — I10 Essential (primary) hypertension: Secondary | ICD-10-CM | POA: Diagnosis not present

## 2023-01-16 DIAGNOSIS — E785 Hyperlipidemia, unspecified: Secondary | ICD-10-CM

## 2023-01-16 DIAGNOSIS — I3481 Nonrheumatic mitral (valve) annulus calcification: Secondary | ICD-10-CM

## 2023-01-16 LAB — ECHOCARDIOGRAM COMPLETE
AR max vel: 3.29 cm2
AV Area VTI: 3.15 cm2
AV Area mean vel: 3.13 cm2
AV Mean grad: 2 mmHg
AV Peak grad: 4.7 mmHg
Ao pk vel: 1.08 m/s
Area-P 1/2: 3.66 cm2
P 1/2 time: 882 msec
S' Lateral: 2.25 cm

## 2023-01-20 ENCOUNTER — Other Ambulatory Visit: Payer: Self-pay | Admitting: *Deleted

## 2023-01-20 MED ORDER — CLOPIDOGREL BISULFATE 75 MG PO TABS: 75.0000 mg | ORAL_TABLET | Freq: Every day | ORAL | 3 refills | Status: DC

## 2023-02-24 ENCOUNTER — Encounter (INDEPENDENT_AMBULATORY_CARE_PROVIDER_SITE_OTHER): Payer: 59 | Admitting: Ophthalmology

## 2023-02-24 DIAGNOSIS — Z961 Presence of intraocular lens: Secondary | ICD-10-CM

## 2023-02-24 DIAGNOSIS — H43393 Other vitreous opacities, bilateral: Secondary | ICD-10-CM

## 2023-02-24 DIAGNOSIS — H35371 Puckering of macula, right eye: Secondary | ICD-10-CM

## 2023-02-24 DIAGNOSIS — H04123 Dry eye syndrome of bilateral lacrimal glands: Secondary | ICD-10-CM

## 2023-02-24 DIAGNOSIS — H35033 Hypertensive retinopathy, bilateral: Secondary | ICD-10-CM

## 2023-02-24 DIAGNOSIS — I10 Essential (primary) hypertension: Secondary | ICD-10-CM

## 2023-02-24 DIAGNOSIS — H35353 Cystoid macular degeneration, bilateral: Secondary | ICD-10-CM

## 2023-03-14 ENCOUNTER — Other Ambulatory Visit: Payer: Self-pay

## 2023-03-14 MED ORDER — ROSUVASTATIN CALCIUM 5 MG PO TABS: 5.0000 mg | ORAL_TABLET | Freq: Every day | ORAL | 3 refills | Status: DC

## 2023-03-14 MED ORDER — EZETIMIBE 10 MG PO TABS: 10.0000 mg | ORAL_TABLET | Freq: Every day | ORAL | 3 refills | Status: DC

## 2023-03-14 MED ORDER — METOPROLOL TARTRATE 25 MG PO TABS: ORAL_TABLET | ORAL | 3 refills | Status: DC

## 2023-03-14 NOTE — Addendum Note (Signed)
Addended by: Margaret Pyle D on: 03/14/2023 09:27 AM   Modules accepted: Orders

## 2023-05-09 ENCOUNTER — Other Ambulatory Visit: Payer: Self-pay | Admitting: Family Medicine

## 2023-05-09 DIAGNOSIS — R221 Localized swelling, mass and lump, neck: Secondary | ICD-10-CM

## 2023-05-12 ENCOUNTER — Other Ambulatory Visit: Payer: 59

## 2023-05-12 DIAGNOSIS — R221 Localized swelling, mass and lump, neck: Secondary | ICD-10-CM

## 2023-05-19 ENCOUNTER — Other Ambulatory Visit: Payer: Self-pay | Admitting: Family Medicine

## 2023-05-19 DIAGNOSIS — R221 Localized swelling, mass and lump, neck: Secondary | ICD-10-CM

## 2023-05-26 ENCOUNTER — Ambulatory Visit
Admission: RE | Admit: 2023-05-26 | Discharge: 2023-05-26 | Disposition: A | Discharge status: Home / Self Care | Payer: 59 | Source: Ambulatory Visit | Attending: Family Medicine | Admitting: Family Medicine

## 2023-05-26 DIAGNOSIS — R221 Localized swelling, mass and lump, neck: Secondary | ICD-10-CM

## 2023-05-26 MED ORDER — IOPAMIDOL (ISOVUE-300) INJECTION 61%
75.0000 mL | Freq: Once | INTRAVENOUS | Status: AC | PRN
  Administered 2023-05-26: 75 mL via INTRAVENOUS

## 2023-06-20 ENCOUNTER — Other Ambulatory Visit (INDEPENDENT_AMBULATORY_CARE_PROVIDER_SITE_OTHER): Payer: Self-pay | Admitting: Ophthalmology

## 2023-08-06 NOTE — Telephone Encounter (Signed)
Filled via phone request MS

## 2023-08-09 IMAGING — US US SCROTUM W/ DOPPLER COMPLETE
1 series · 14 of 25 positions shown · non-contrast
Comparison: None.

CLINICAL DATA: Scrotal nodule.  EVALUATE RIGHT PALPABLE NODULE

EXAM:
SCROTAL ULTRASOUND
DOPPLER ULTRASOUND OF THE TESTICLES
TECHNIQUE: Complete ultrasound examination of the testicles, epididymis, and
other scrotal structures was performed. Color and spectral Doppler
ultrasound were also utilized to evaluate blood flow to the
testicles.

[Series 1: us scrotum w/ doppler complete · 0.07mm/px · 14 of 52 slices shown]
[im 1/52]
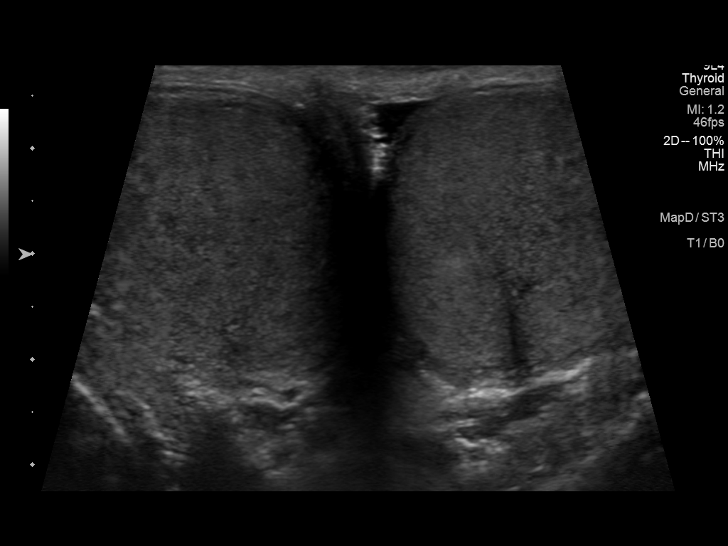
[im 5/52]
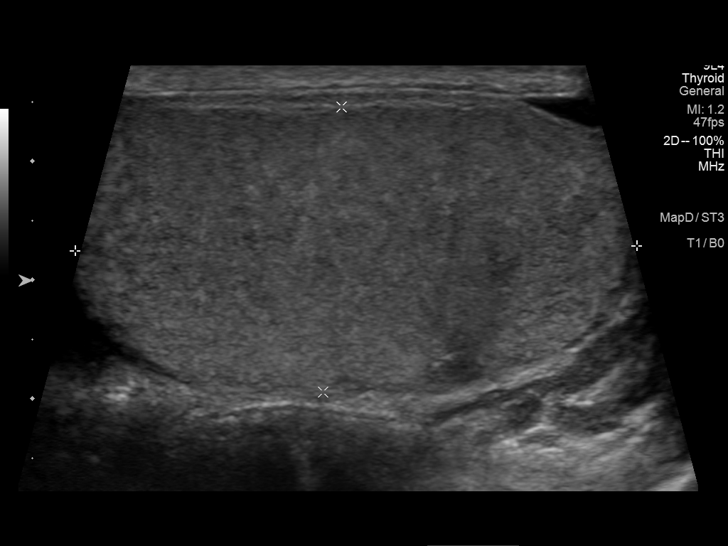
[im 9/52]
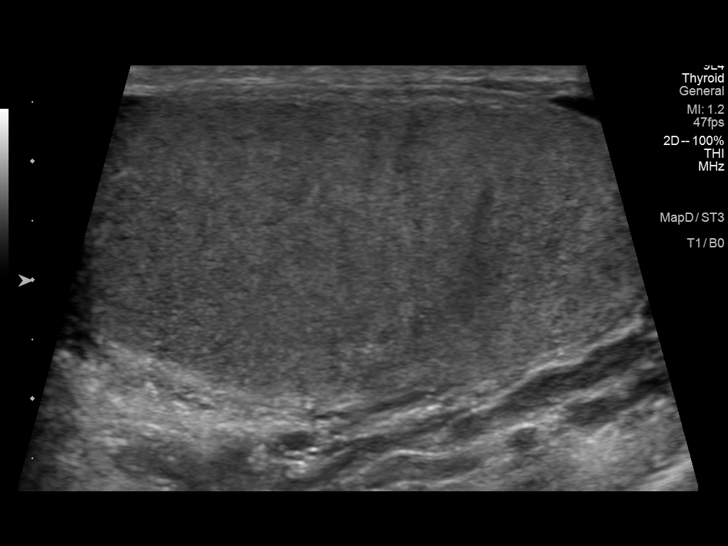
[im 13/52]
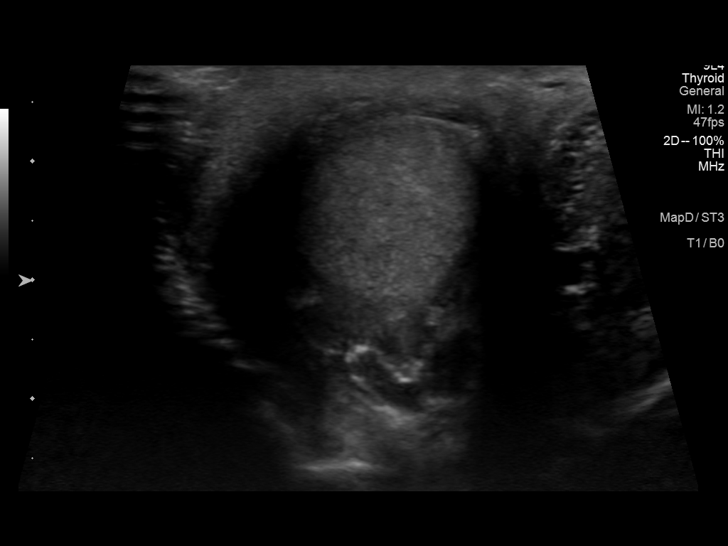
[im 18/52]
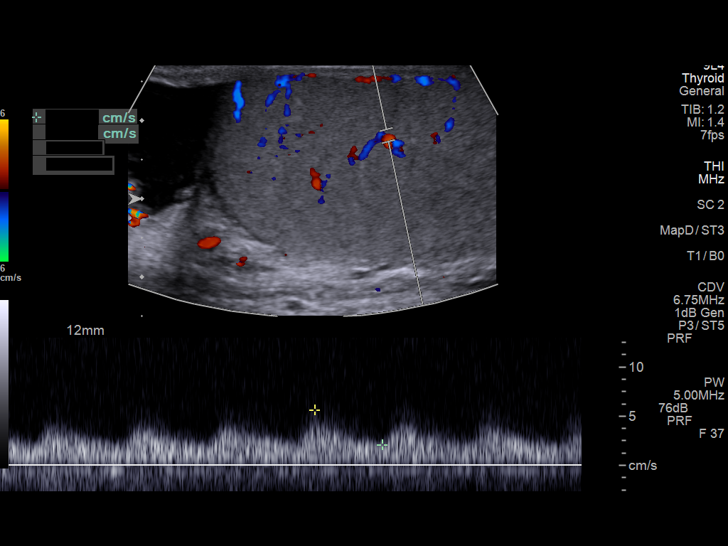
[im 20/52]
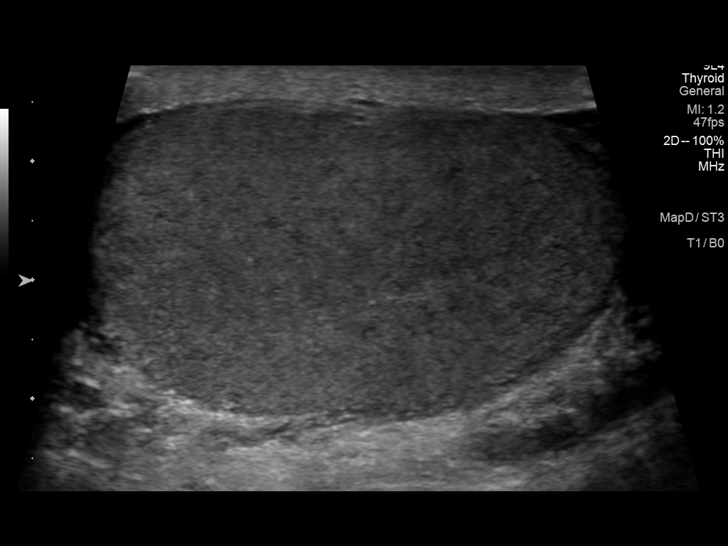
[im 24/52]
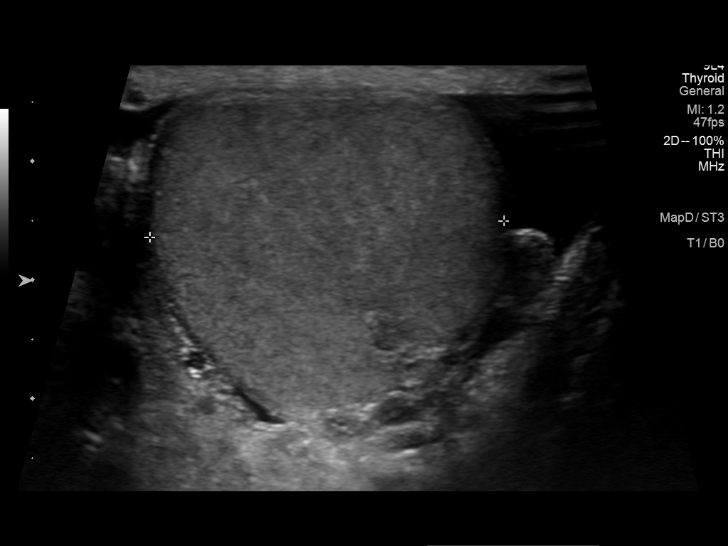
[im 28/52]
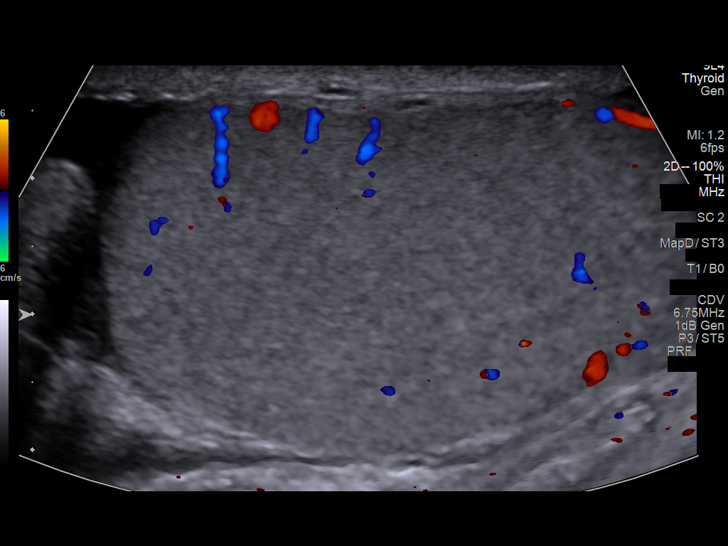
[im 32/52]
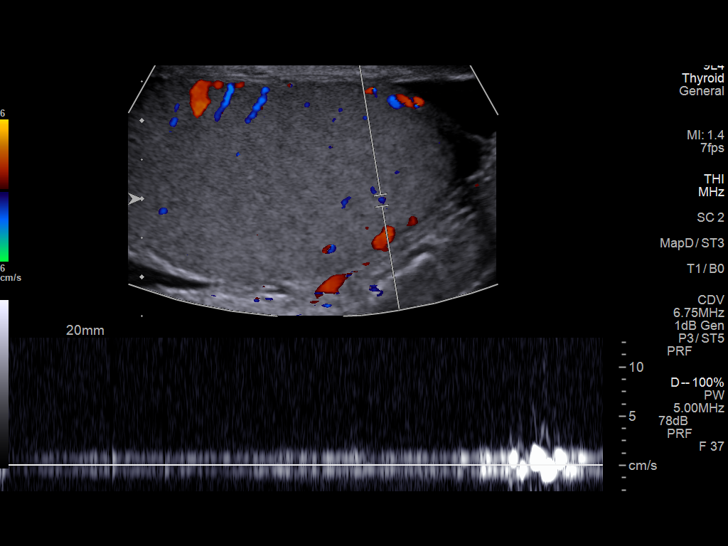
[im 35/52]
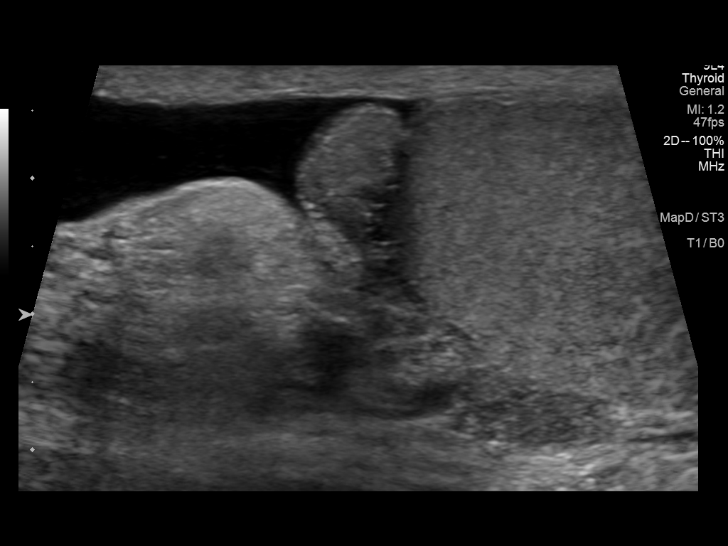
[im 39/52]
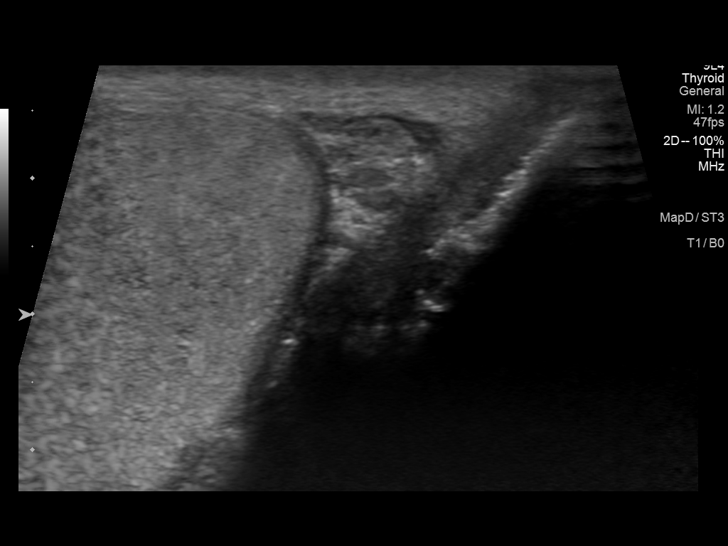
[im 43/52]
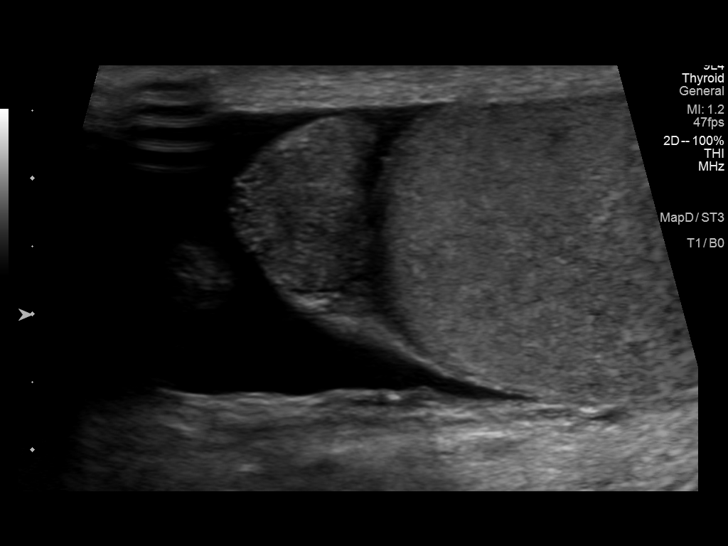
[im 47/52]
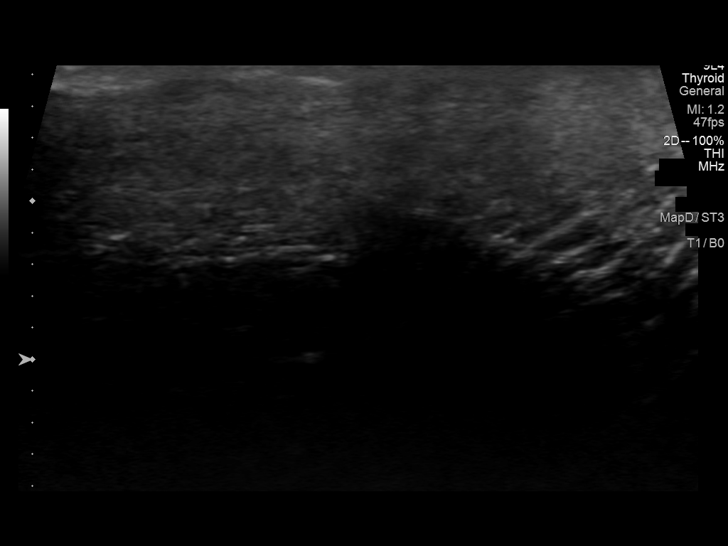
[im 52/52]
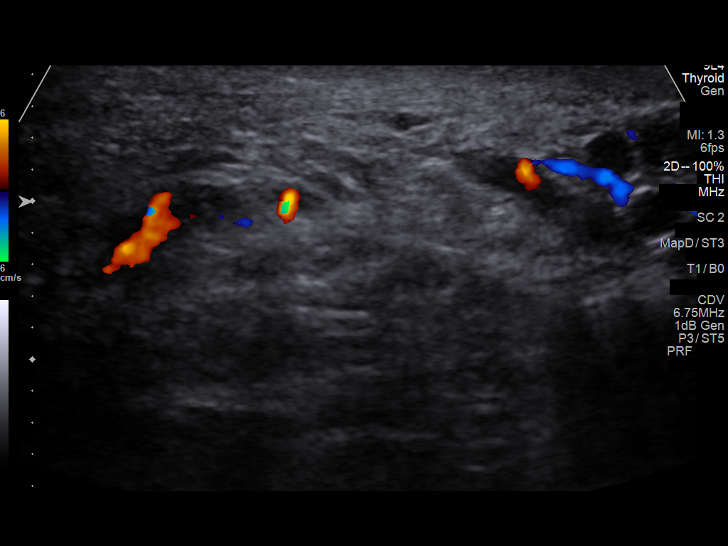

[14 of 25 positions shown; findings below may reference images not displayed]

FINDINGS: Right testicle

Measurements: 4.7 x 2.4 x 2.6 cm. No mass or microlithiasis
visualized.

Left testicle

Measurements: 4.4 x 2.6 x 3.0 cm. No mass or microlithiasis
visualized.

Right epididymis:  Normal in size and appearance.

Left epididymis:  Normal in size and appearance.

Hydrocele:  Trace bilateral hydroceles.

Varicocele:  None visualized.

Pulsed Doppler interrogation of both testes demonstrates normal low
resistance arterial and venous waveforms bilaterally.
IMPRESSION: Normal testicular ultrasound.  No evidence of scrotal mass.

## 2023-12-17 NOTE — Progress Notes (Unsigned)
 No chief complaint on file.  History of Present Illness: 63 yo male with history of CAD, HTN and hyperlipidemia who is here today for follow up. He has been followed in our office by Dr. Katrinka Blazing and was seen once in March 2024 by Dr. Shari Prows. He had an inferior STEMI in December 2019 and his occluded ostial Circumflex was treated with a drug eluting stent. Mild disease in the LAD and RCA. Echo April 2024 with LVEF=55-60% with basal inferior hypokinesis. No valve disease.   He is here today for follow up. The patient denies any chest pain, dyspnea, palpitations, lower extremity edema, orthopnea, PND, dizziness, near syncope or syncope.   Primary Care Physician: Sigmund Hazel, MD   Past Medical History:  Diagnosis Date   Back pain    CAD (coronary artery disease)    a. 09/2018 Inf STEMI/PCI: LM nl, LAD min irregs, D1/2 min irregs, LCX 100ost/p thrombotic (2.75x35 Orsiro DES), RCA 40d, RPAV 90.   Carpal tunnel syndrome    DDD (degenerative disc disease)    Diastolic dysfunction    a. 09/2018 Echo: EF 50-55%, prob inflat and antlat HK, Gr1 DD. Mildlly dil Ao root (58m). Nl RV fxn.   Hyperlipemia    Hypertension    Hypertensive retinopathy    OU   Mildly Dilated aortic root (HCC)    a. 09/2018 Echo: 42mm.   Tubular adenocarcinoma (HCC) 2010   colon    Past Surgical History:  Procedure Laterality Date   CARPAL TUNNEL RELEASE Left 01/06/2013   Procedure: CARPAL TUNNEL RELEASE;  Surgeon: Nicki Reaper, MD;  Location: [facility];  Service: Orthopedics;  Laterality: Left;  ANESTHESIA: IV REGIONAL FAB   CATARACT EXTRACTION Bilateral 2019   Dr. Royal Hawthorn Stonecipher   COLONOSCOPY     CORONARY ANGIOGRAPHY N/A 10/08/2018   Procedure: CORONARY ANGIOGRAPHY;  Surgeon: Tonny Bollman, MD;  [facility] INVAS[facility] CV LAB;  Service: Cardiovascular;  Laterality: N/A;   CORONARY STENT INTERVENTION N/A 10/08/2018   Procedure: CORONARY STENT INTERVENTION;  Surgeon: Tonny Bollman, MD;   [facility]: [facility] CV LAB;  Service: Cardiovascular;  Laterality: N/A;   CORONARY/GRAFT ACUTE MI REVASCULARIZATION N/A 10/08/2018   Procedure: Coronary/Graft Acute MI Revascularization;  Surgeon: Tonny Bollman, MD;  Location: [facility] INVAS[facility] CV LAB;  Service: Cardiovascular;  Laterality: N/A;   EYE SURGERY Bilateral 2019   Cat Sx - Dr. Tyrone Schimke   KNEE ARTHROSCOPY     rightx2   PARS PLANA VITRECTOMY Left 08/30/2021   Procedure: PARS PLANA VITRECTOMY WITH 25 GAUGE;  Surgeon: Rennis Chris, MD;  Location: [facility] OR;  Service: Ophthalmology;  Laterality: Left;   PARS PLANA VITRECTOMY Right 10/11/2021   Procedure: TWENTY-FIVE GAUGE PARS PLANA VITRECTOMY;  Surgeon: Rennis Chris, MD;  Location: [facility] OR;  Service: Ophthalmology;  Laterality: Right;   PHOTOCOAGULATION WITH LASER Left 08/30/2021   Procedure: PHOTOCOAGULATION WITH LASER;  Surgeon: Rennis Chris, MD;  [facility];  Service: Ophthalmology;  Laterality: Left;   PHOTOCOAGULATION WITH LASER Right 10/11/2021   Procedure: PHOTOCOAGULATION WITH LASER;  Surgeon: Rennis Chris, MD;  Location: [facility] OR;  Service: Ophthalmology;  Laterality: Right;   RIB RESECTION  2003   thorasic outlet syndrome-rt   SHOULDER ARTHROSCOPY     rightx2   TONSILLECTOMY      Current Outpatient Medications  Medication Sig Dispense Refill   brimonidine-timolol (COMBIGAN) 0.2-0.5 % ophthalmic solution Place 1 drop into both eyes in the morning and at bedtime.     Bromfenac  Sodium (PROLENSA) 0.07 % SOLN Place 1 drop into the right eye 4 (four) times daily. (Patient taking differently: Place 1 drop into the right eye in the morning, at noon, and at bedtime.) 6 mL 3   carbidopa-levodopa (SINEMET IR) 25-100 MG per tablet Take 0.5 tablets by mouth at bedtime. Take 1/2 to 1 tablet by mouth daily before bedtime.     clopidogrel (PLAVIX) 75 MG tablet Take 1 tablet (75 mg total) by mouth daily. 90 tablet 3   ezetimibe (ZETIA) 10 MG tablet Take 1 tablet (10 mg total) by  mouth daily. 90 tablet 3   famotidine (PEPCID) 40 MG tablet Take 40 mg by mouth at bedtime.     metoprolol tartrate (LOPRESSOR) 25 MG tablet TAKE 1 TABLET BY MOUTH 2 TIMES DAILY. 180 tablet 3   Multiple Vitamin (MULTIVITAMIN WITH MINERALS) TABS tablet Take 1 tablet by mouth in the morning. Centrum Multivitamin     nitroGLYCERIN (NITROSTAT) 0.4 MG SL tablet Place 1 tablet (0.4 mg total) under the tongue every 5 (five) minutes x 3 doses as needed for chest pain. 75 tablet 0   rosuvastatin (CRESTOR) 5 MG tablet Take 1 tablet (5 mg total) by mouth daily. 90 tablet 3   zolpidem (AMBIEN) 5 MG tablet Take 10 mg by mouth at bedtime as needed for sleep.     No current facility-administered medications for this visit.    Allergies  Allergen Reactions   Shellfish Allergy Anaphylaxis    Just to shrimp    Social History   Socioeconomic History   Marital status: Married    Spouse name: Not on file   Number of children: Not on file   Years of education: Not on file   Highest education level: Not on file  Occupational History   Not on file  Tobacco Use   Smoking status: Never   Smokeless tobacco: Never  Vaping Use   Vaping status: Never Used  Substance and Sexual Activity   Alcohol use: Yes    Comment: occ   Drug use: No   Sexual activity: Not on file  Other Topics Concern   Not on file  Social History Narrative   Not on file   Social Drivers of Health   Financial Resource Strain: Not on file  Food Insecurity: Not on file  Transportation Needs: Not on file  Physical Activity: Not on file  Stress: Not on file  Social Connections: Not on file  Intimate Partner Violence: Not on file    No family history on file.  Review of Systems:  As stated in the HPI and otherwise negative.   There were no vitals taken for this visit.  Physical Examination: General: Well developed, well nourished, NAD  HEENT: OP clear, mucus membranes moist  SKIN: warm, dry. No rashes. Neuro: No focal  deficits  Musculoskeletal: Muscle strength 5/5 all ext  Psychiatric: Mood and affect normal  Neck: No JVD, no carotid bruits, no thyromegaly, no lymphadenopathy.  Lungs:Clear bilaterally, no wheezes, rhonci, crackles Cardiovascular: Regular rate and rhythm. No murmurs, gallops or rubs. Abdomen:Soft. Bowel sounds present. Non-tender.  Extremities: No lower extremity edema. Pulses are 2 + in the bilateral DP/PT.  EKG:  EKG {ACTION; IS/IS RUE:[id number]} ordered today. The ekg ordered today demonstrates ***  Recent Labs: No results found for requested labs within last 365 days.   Lipid Panel    Component Value Date/Time   CHOL 126 12/19/2022 0920   TRIG 127 12/19/2022 0920   HDL  37 (L) 12/19/2022 0920   CHOLHDL 3.4 12/19/2022 0920   CHOLHDL 3.3 10/09/2018 0028   VLDL 7 10/09/2018 0028   LDLCALC 66 12/19/2022 0920     Wt Readings from Last 3 Encounters:  12/19/22 83 kg  12/14/21 85.6 kg  10/11/21 83.5 kg    Assessment and Plan:   1. CAD without angina: No chest pain suggestive of angina. Continue Plavix, Crestor, Zetia and Lopressor  2. HTN: BP is well controlled. No changes today  4. Hyperlipidemia: LDL 66 in March 2024. Continue Crestor and Zetia. Lipids and LFTs today  Labs/ tests ordered today include:  No orders of the defined types were placed in this encounter.    Disposition:   F/U with me in ***    Signed, Verne Carrow, MD, [facility] 12/17/2023 2:02 PM    [facility] [address] [place], [place], [place]  [address] Phone: [phone]; Fax: [phone]

## 2023-12-18 ENCOUNTER — Encounter: Payer: Self-pay | Admitting: Cardiovascular Disease

## 2023-12-18 ENCOUNTER — Ambulatory Visit: Attending: Cardiovascular Disease | Admitting: Cardiovascular Disease

## 2023-12-18 VITALS — BP 110/68 | HR 65 | Ht 70.0 in | Wt 188.4 lb

## 2023-12-18 DIAGNOSIS — E785 Hyperlipidemia, unspecified: Secondary | ICD-10-CM | POA: Diagnosis not present

## 2023-12-18 DIAGNOSIS — I25110 Atherosclerotic heart disease of native coronary artery with unstable angina pectoris: Secondary | ICD-10-CM

## 2023-12-18 DIAGNOSIS — I10 Essential (primary) hypertension: Secondary | ICD-10-CM | POA: Diagnosis not present

## 2023-12-18 MED ORDER — ASPIRIN 81 MG PO TBEC: 81.0000 mg | DELAYED_RELEASE_TABLET | Freq: Every day | ORAL | Status: AC

## 2023-12-18 NOTE — Patient Instructions (Addendum)
 Medication Instructions:  Your physician has recommended you make the following change in your medication:  1.) stop clopidogrel (Plavix) 2.) start aspirin 81 mg - one tablet daily  *If you need a refill on your cardiac medications before your next appointment, please call your pharmacy*   Lab Work: Today: go to the [employer] on first floor --lipids and liver function   Testing/Procedures: none   Follow-Up: At [facility], you and your health needs are our priority.  As part of our continuing mission to provide you with exceptional heart care, we have created designated Provider Care Teams.  These Care Teams include your primary Cardiologist (physician) and Advanced Practice Providers (APPs -  Physician Assistants and Nurse Practitioners) who all work together to provide you with the care you need, when you need it.   Your next appointment:   12 month(s)  Provider:   Verne Carrow, MD     Other Instructions

## 2023-12-19 ENCOUNTER — Telehealth: Payer: Self-pay | Admitting: *Deleted

## 2023-12-19 DIAGNOSIS — E785 Hyperlipidemia, unspecified: Secondary | ICD-10-CM

## 2023-12-19 DIAGNOSIS — I25110 Atherosclerotic heart disease of native coronary artery with unstable angina pectoris: Secondary | ICD-10-CM

## 2023-12-19 LAB — HEPATIC FUNCTION PANEL
ALT: 25 IU/L (ref 0–44)
AST: 21 IU/L (ref 0–40)
Albumin: 4.2 g/dL (ref 3.9–4.9)
Alkaline Phosphatase: 73 IU/L (ref 44–121)
Bilirubin Total: 0.6 mg/dL (ref 0.0–1.2)
Bilirubin, Direct: 0.18 mg/dL (ref 0.00–0.40)
Total Protein: 6.6 g/dL (ref 6.0–8.5)

## 2023-12-19 LAB — LIPID PANEL
Chol/HDL Ratio: 3.6 ratio (ref 0.0–5.0)
Cholesterol, Total: 145 mg/dL (ref 100–199)
HDL: 40 mg/dL (ref >39)
LDL Chol Calc (NIH): 80 mg/dL (ref 0–99)
Triglycerides: 143 mg/dL (ref 0–149)
VLDL Cholesterol Cal: 25 mg/dL (ref 5–40)

## 2023-12-19 MED ORDER — ROSUVASTATIN CALCIUM 10 MG PO TABS: 10.0000 mg | ORAL_TABLET | Freq: Every day | ORAL | 3 refills | Status: DC

## 2023-12-19 NOTE — Telephone Encounter (Signed)
 Called and spoke w patient.  He will increase Crestor to 10 mg daily and continue Zetia.  Aware to repeat lipids and liver function in about 3 months.

## 2023-12-19 NOTE — Telephone Encounter (Signed)
-----   Message from Verne Carrow sent at 12/19/2023  8:25 AM EST ----- LDL not at goal of 70. I would continue Zetia and have him increase his Crestor to 10 mg daily. Repeat lipids and LFTS in 12 weeks. cdm

## 2024-01-02 ENCOUNTER — Other Ambulatory Visit: Payer: Self-pay

## 2024-01-02 MED ORDER — METOPROLOL TARTRATE 25 MG PO TABS: ORAL_TABLET | ORAL | 3 refills | Status: AC

## 2024-01-02 MED ORDER — EZETIMIBE 10 MG PO TABS: 10.0000 mg | ORAL_TABLET | Freq: Every day | ORAL | 3 refills | Status: AC

## 2024-01-02 NOTE — Addendum Note (Signed)
 Addended by: Margaret Pyle D on: 01/02/2024 08:37 AM   Modules accepted: Orders

## 2024-02-09 ENCOUNTER — Ambulatory Visit: Payer: 59 | Admitting: Cardiovascular Disease

## 2024-03-09 ENCOUNTER — Encounter: Payer: Self-pay | Admitting: Cardiovascular Disease

## 2024-03-12 LAB — HEPATIC FUNCTION PANEL
ALT: 31 IU/L (ref 0–44)
AST: 27 IU/L (ref 0–40)
Albumin: 4.4 g/dL (ref 3.9–4.9)
Alkaline Phosphatase: 76 IU/L (ref 44–121)
Bilirubin Total: 0.4 mg/dL (ref 0.0–1.2)
Bilirubin, Direct: 0.16 mg/dL (ref 0.00–0.40)
Total Protein: 6.6 g/dL (ref 6.0–8.5)

## 2024-03-12 LAB — LIPID PANEL
Chol/HDL Ratio: 2.9 ratio (ref 0.0–5.0)
Cholesterol, Total: 119 mg/dL (ref 100–199)
HDL: 41 mg/dL (ref >39)
LDL Chol Calc (NIH): 61 mg/dL (ref 0–99)
Triglycerides: 90 mg/dL (ref 0–149)
VLDL Cholesterol Cal: 17 mg/dL (ref 5–40)

## 2024-08-03 ENCOUNTER — Telehealth (HOSPITAL_BASED_OUTPATIENT_CLINIC_OR_DEPARTMENT_OTHER): Payer: Self-pay

## 2024-08-03 NOTE — Telephone Encounter (Signed)
   Pre-operative Risk Assessment    Patient Name: Andrew Mitchell  DOB: 1960/12/02 MRN: [id number]   Date of last office visit: 12/18/2023, Dr. Lonni Cash, MD Date of next office visit: NONE   Request for Surgical Clearance    Procedure:  Bilateral upper eyelid blepharoptosis repair CC Bilateral upper eyelid blepharoplasty  Date of Surgery:  Clearance 08/27/24                                Surgeon: Dr. Renzo Zaldivar Surgeon's Group or Practice Name: [facility] Phone number: [phone]  Fax number: [phone]   Type of Clearance Requested:   - Medical  - Pharmacy:  Hold Aspirin      Type of Anesthesia:  MAC   Additional requests/questions:    SignedAsberry KANDICE Dunning   08/03/2024, 5:10 PM

## 2024-08-04 ENCOUNTER — Telehealth: Payer: Self-pay

## 2024-08-04 NOTE — Telephone Encounter (Signed)
 Appointment is scheduled for 08/17/2024. Med req and consent are complete. Call patient at [phone].

## 2024-08-04 NOTE — Telephone Encounter (Signed)
   Name: Andrew Mitchell  DOB: April 24, 1961  MRN: [id number]  Primary Cardiologist: Lonni Cash, MD   Preoperative team, please contact this patient and set up a phone call appointment for further preoperative risk assessment. Please obtain consent and complete medication review. Thank you for your help.  I confirm that guidance regarding antiplatelet and oral anticoagulation therapy has been completed and, if necessary, noted below.  Per office protocol, if patient is without any new symptoms or concerns at the time of their virtual visit, she may hold ASA for 7 days prior to procedure. Please resume Aspirin  as soon as possible postprocedure, at the discretion of the surgeon.    I also confirmed the patient resides in the state of [place] . As per [facility] Medical Board telemedicine laws, the patient must reside in the state in which the provider is licensed.   Lamarr Satterfield, NP 08/04/2024, 2:26 PM [facility]

## 2024-08-04 NOTE — Telephone Encounter (Signed)
  Patient Consent for Virtual Visit         Andrew Mitchell has provided verbal consent on 08/04/2024 for a virtual visit (video or telephone). Appt scheduled for 08/17/2024 @ 2pm. Med req and consent are complete. Call patient at [phone].   CONSENT FOR VIRTUAL VISIT FOR:  Andrew Mitchell  By participating in this virtual visit I agree to the following:  I hereby voluntarily request, consent and authorize [facility] and its employed or contracted physicians, physician assistants, nurse practitioners or other licensed health care professionals (the Practitioner), to provide me with telemedicine health care services (the "Services) as deemed necessary by the treating Practitioner. I acknowledge and consent to receive the Services by the Practitioner via telemedicine. I understand that the telemedicine visit will involve communicating with the Practitioner through live audiovisual communication technology and the disclosure of certain medical information by electronic transmission. I acknowledge that I have been given the opportunity to request an in-person assessment or other available alternative prior to the telemedicine visit and am voluntarily participating in the telemedicine visit.  I understand that I have the right to withhold or withdraw my consent to the use of telemedicine in the course of my care at any time, without affecting my right to future care or treatment, and that the Practitioner or I may terminate the telemedicine visit at any time. I understand that I have the right to inspect all information obtained and/or recorded in the course of the telemedicine visit and may receive copies of available information for a reasonable fee.  I understand that some of the potential risks of receiving the Services via telemedicine include:  Delay or interruption in medical evaluation due to technological equipment failure or disruption; Information transmitted may not be sufficient  (e.g. poor resolution of images) to allow for appropriate medical decision making by the Practitioner; and/or  In rare instances, security protocols could fail, causing a breach of personal health information.  Furthermore, I acknowledge that it is my responsibility to provide information about my medical history, conditions and care that is complete and accurate to the best of my ability. I acknowledge that Practitioner's advice, recommendations, and/or decision may be based on factors not within their control, such as incomplete or inaccurate data provided by me or distortions of diagnostic images or specimens that may result from electronic transmissions. I understand that the practice of medicine is not an exact science and that Practitioner makes no warranties or guarantees regarding treatment outcomes. I acknowledge that a copy of this consent can be made available to me via my patient portal [facility] MyChart), or I can request a printed copy by calling the office of [facility].    I understand that my insurance will be billed for this visit.   I have read or had this consent read to me. I understand the contents of this consent, which adequately explains the benefits and risks of the Services being provided via telemedicine.  I have been provided ample opportunity to ask questions regarding this consent and the Services and have had my questions answered to my satisfaction. I give my informed consent for the services to be provided through the use of telemedicine in my medical care

## 2024-08-17 ENCOUNTER — Ambulatory Visit: Payer: Self-pay | Attending: Cardiology | Admitting: Cardiology

## 2024-08-17 DIAGNOSIS — I10 Essential (primary) hypertension: Secondary | ICD-10-CM | POA: Diagnosis not present

## 2024-08-17 NOTE — Progress Notes (Signed)
 Virtual Visit via Telephone Note   Because of Andrew Mitchell co-morbid illnesses, he is at least at moderate risk for complications without adequate follow up.  This format is felt to be most appropriate for this patient at this time.  Due to technical limitations with video connection (technology), today's appointment will be conducted as an audio only telehealth visit, and Andrew Mitchell verbally agreed to proceed in this manner.   All issues noted in this document were discussed and addressed.  No physical exam could be performed with this format.  Evaluation Performed:  Preoperative cardiovascular risk assessment _____________   Date:  08/17/2024   Patient ID:  Andrew Mitchell, DOB [birth year], MRN [id number] Patient Location:  Home Provider location:   Office  Primary Care Provider:  Cleotilde Planas, MD Primary Cardiologist:  Lonni Cash, MD  Chief Complaint / Patient Profile   63 y.o. y/o male with a h/o CAD s/p STEMI in December 2019 DES to circumflex, hypertension, dyslipidemia who is pending bilateral blepharoplastosis repair and presents today for telephonic preoperative cardiovascular risk assessment.  History of Present Illness    Andrew Mitchell is a 63 y.o. male who presents via audio/video conferencing for a telehealth visit today.  Pt was last seen in cardiology clinic on 12/18/2023 by Dr. Cash.  At that time Andrew Mitchell was doing well.  The patient is now pending procedure as outlined above. Since his last visit, he has been on a cruise to [place] and dealing with lingering URI issues. He has recently started a walking challenge at work and is walking 3-5 miles/day. He denies chest pain, palpitations, dyspnea, pnd, orthopnea, n, v, dizziness, syncope, edema, weight gain, or early satiety.    Past Medical History    Past Medical History:  Diagnosis Date   Back pain    CAD (coronary artery disease)    a. 09/2018 Inf STEMI/PCI: LM nl, LAD min irregs, D1/2 min  irregs, LCX 100ost/p thrombotic (2.75x35 Orsiro DES), RCA 40d, RPAV 90.   Carpal tunnel syndrome    DDD (degenerative disc disease)    Diastolic dysfunction    a. 09/2018 Echo: EF 50-55%, prob inflat and antlat HK, Gr1 DD. Mildlly dil Ao root (3m). Nl RV fxn.   Hyperlipemia    Hypertension    Hypertensive retinopathy    OU   Mildly Dilated aortic root (HCC)    a. 09/2018 Echo: 42mm.   Tubular adenocarcinoma (HCC) 2010   colon   Past Surgical History:  Procedure Laterality Date   CARPAL TUNNEL RELEASE Left 01/06/2013   Procedure: CARPAL TUNNEL RELEASE;  Surgeon: Arley JONELLE Curia, MD;  Location: [facility];  Service: Orthopedics;  Laterality: Left;  ANESTHESIA: IV REGIONAL FAB   CATARACT EXTRACTION Bilateral 2019   Dr. Helene Stonecipher   COLONOSCOPY     CORONARY ANGIOGRAPHY N/A 10/08/2018   Procedure: CORONARY ANGIOGRAPHY;  Surgeon: Wonda Sharper, MD;  Location: [facility] INVASIVE CV LAB;  Service: Cardiovascular;  Laterality: N/A;   CORONARY STENT INTERVENTION N/A 10/08/2018   Procedure: CORONARY STENT INTERVENTION;  Surgeon: Wonda Sharper, MD;  Location: [facility] INVASIVE CV LAB;  Service: Cardiovascular;  Laterality: N/A;   CORONARY/GRAFT ACUTE MI REVASCULARIZATION N/A 10/08/2018   Procedure: Coronary/Graft Acute MI Revascularization;  Surgeon: Wonda Sharper, MD;  Location: [facility] INVASIVE CV LAB;  Service: Cardiovascular;  Laterality: N/A;   EYE SURGERY Bilateral 2019   Cat Sx - Dr. Helene Deacon   KNEE ARTHROSCOPY     rightx2  PARS PLANA VITRECTOMY Left 08/30/2021   Procedure: PARS PLANA VITRECTOMY WITH 25 GAUGE;  Surgeon: Valdemar Rogue, MD;  Location[facility];  Service: Ophthalmology;  Laterality: Left;   PARS PLANA VITRECTOMY Right 10/11/2021   Procedure: TWENTY-FIVE GAUGE PARS PLANA VITRECTOMY;  Surgeon: Valdemar Rogue, MD;  Location: [facility] OR;  Service: Ophthalmology;  Laterality: Right;   PHOTOCOAGULATION WITH LASER Left 08/30/2021   Procedure: PHOTOCOAGULATION WITH LASER;   Surgeon: Valdemar Rogue, MD;  Location: [facility] OR;  Service: Ophthalmology;  Laterality: Left;   PHOTOCOAGULATION WITH LASER Right 10/11/2021   Procedure: PHOTOCOAGULATION WITH LASER;  Surgeon: Valdemar Rogue, MD;  Location: [facility] OR;  Service: Ophthalmology;  Laterality: Right;   RIB RESECTION  2003   thorasic outlet syndrome-rt   SHOULDER ARTHROSCOPY     rightx2   TONSILLECTOMY      Allergies  Allergies  Allergen Reactions   Shellfish Allergy Anaphylaxis    Just to shrimp    Home Medications    Prior to Admission medications   Medication Sig Start Date End Date Taking? Authorizing Provider  aspirin  EC 81 MG tablet Take 1 tablet (81 mg total) by mouth daily. Swallow whole. 12/18/23   Verlin Lonni BIRCH, MD  brimonidine  (ALPHAGAN ) 0.2 % ophthalmic solution Place 1 drop into both eyes in the morning and at bedtime. 12/16/23   [provider]  Bromfenac  Sodium (PROLENSA ) 0.07 % SOLN Place 1 drop into the right eye 4 (four) times daily. Patient taking differently: Place 1 drop into the right eye in the morning, at noon, and at bedtime. 07/10/22   Valdemar Rogue, MD  carbidopa -levodopa  (SINEMET  IR) 25-100 MG per tablet Take 0.5 tablets by mouth at bedtime. Take 1/2 to 1 tablet by mouth daily before bedtime.    [provider]  ezetimibe  (ZETIA ) 10 MG tablet Take 1 tablet (10 mg total) by mouth daily. 01/02/24   Verlin Lonni BIRCH, MD  famotidine (PEPCID) 40 MG tablet Take 40 mg by mouth at bedtime.    [provider]  ipratropium (ATROVENT) 0.03 % nasal spray SMARTSIG:2 Spray(s) Both Nares 3 Times Daily PRN 12/04/23   [provider]  metoprolol  tartrate (LOPRESSOR ) 25 MG tablet TAKE 1 TABLET BY MOUTH 2 TIMES DAILY. 01/02/24   Verlin Lonni BIRCH, MD  Multiple Vitamin (MULTIVITAMIN WITH MINERALS) TABS tablet Take 1 tablet by mouth in the morning. Centrum Multivitamin    [provider]  nitroGLYCERIN  (NITROSTAT ) 0.4 MG SL tablet Place 1 tablet (0.4  mg total) under the tongue every 5 (five) minutes x 3 doses as needed for chest pain. 06/22/19   Claudene Victory ORN, MD  rosuvastatin  (CRESTOR ) 10 MG tablet Take 1 tablet (10 mg total) by mouth daily. 12/19/23   Verlin Lonni BIRCH, MD  zolpidem (AMBIEN) 5 MG tablet Take 10 mg by mouth at bedtime as needed for sleep. 05/01/21   [provider]    Physical Exam    Vital Signs:  Andrew Mitchell does not have vital signs available for review today.  Given telephonic nature of communication, physical exam is limited. AAOx3. NAD. Normal affect.  Speech and respirations are unlabored.  Accessory Clinical Findings    None  Assessment & Plan    1.  Preoperative Cardiovascular Risk Assessment: According to the Revised Cardiac Risk Index (RCRI), his Perioperative Risk of Major Cardiac Event is (%): 0.9 His Functional Capacity in METs is: 7.04 according to the Duke Activity Status Index (DASI). Therefore, based on ACC/AHA guidelines, patient would be  at acceptable risk for the planned procedure without further cardiovascular testing. I will route this recommendation to the requesting party via Epic fax function.   The patient was advised that if he develops new symptoms prior to surgery to contact our office to arrange for a follow-up visit, and he verbalized understanding.  Per office protocol, he may hold ASA for 7 days prior to procedure. Please resume Aspirin  as soon as possible postprocedure, at the discretion of the surgeon.      A copy of this note will be routed to requesting surgeon.  Time:   Today, I have spent 10 minutes with the patient with telehealth technology discussing medical history, symptoms, and management plan.     Andrew JAYSON Hoover, NP  08/17/2024, 11:49 AM

## 2024-11-12 ENCOUNTER — Other Ambulatory Visit: Payer: Self-pay | Admitting: Cardiovascular Disease

## 2025-01-18 ENCOUNTER — Ambulatory Visit: Admitting: Cardiovascular Disease
